# Patient Record
Sex: Male | Born: 1950 | Race: Black or African American | Hispanic: No | Smoking: Former smoker
Health system: Southern US, Community
[De-identification: ages and names within clinical notes are randomized; demographics above are authoritative.]

## PROBLEM LIST (undated history)

## (undated) DIAGNOSIS — I10 Essential (primary) hypertension: Secondary | ICD-10-CM

## (undated) DIAGNOSIS — I429 Cardiomyopathy, unspecified: Secondary | ICD-10-CM

## (undated) DIAGNOSIS — E785 Hyperlipidemia, unspecified: Secondary | ICD-10-CM

## (undated) DIAGNOSIS — K219 Gastro-esophageal reflux disease without esophagitis: Secondary | ICD-10-CM

## (undated) DIAGNOSIS — S37019A Minor contusion of unspecified kidney, initial encounter: Secondary | ICD-10-CM

## (undated) HISTORY — DX: Hyperlipidemia, unspecified: E78.5

## (undated) HISTORY — DX: Essential (primary) hypertension: I10

## (undated) HISTORY — PX: LASIK: SHX215

## (undated) HISTORY — DX: Cardiomyopathy, unspecified: I42.9

## (undated) HISTORY — DX: Minor contusion of unspecified kidney, initial encounter: S37.019A

## (undated) HISTORY — DX: Gastro-esophageal reflux disease without esophagitis: K21.9

## (undated) HISTORY — PX: OTHER SURGICAL HISTORY: SHX169

---

## 1991-03-05 HISTORY — PX: EYE SURGERY: SHX253

## 2001-02-26 ENCOUNTER — Encounter: Payer: Self-pay | Admitting: Emergency Medicine

## 2001-02-26 ENCOUNTER — Emergency Department (HOSPITAL_COMMUNITY): Admission: EM | Admit: 2001-02-26 | Discharge: 2001-02-26 | Payer: Self-pay | Admitting: Emergency Medicine

## 2002-09-18 ENCOUNTER — Inpatient Hospital Stay (HOSPITAL_COMMUNITY): Admission: EM | Admit: 2002-09-18 | Discharge: 2002-09-21 | Payer: Self-pay | Admitting: Emergency Medicine

## 2002-09-18 ENCOUNTER — Encounter: Payer: Self-pay | Admitting: Emergency Medicine

## 2002-09-20 ENCOUNTER — Encounter: Payer: Self-pay | Admitting: General Surgery

## 2004-10-26 ENCOUNTER — Encounter (INDEPENDENT_AMBULATORY_CARE_PROVIDER_SITE_OTHER): Payer: Self-pay | Admitting: Specialist

## 2004-10-26 ENCOUNTER — Ambulatory Visit (HOSPITAL_COMMUNITY): Admission: RE | Admit: 2004-10-26 | Discharge: 2004-10-26 | Payer: Self-pay | Admitting: Gastroenterology

## 2005-09-12 ENCOUNTER — Emergency Department (HOSPITAL_COMMUNITY): Admission: EM | Admit: 2005-09-12 | Discharge: 2005-09-12 | Payer: Self-pay | Admitting: Emergency Medicine

## 2006-10-22 ENCOUNTER — Encounter: Payer: Self-pay | Admitting: Cardiology

## 2007-01-09 ENCOUNTER — Encounter: Admission: RE | Admit: 2007-01-09 | Discharge: 2007-01-09 | Payer: Self-pay | Admitting: Cardiology

## 2007-04-14 ENCOUNTER — Encounter: Payer: Self-pay | Admitting: Cardiology
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
## 2010-07-20 NOTE — Op Note (Signed)
Nathan Stafford, Nathan Stafford                ACCOUNT NO.:  [id number]   MEDICAL RECORD NO.:  [id number]          PATIENT TYPE:  AMB   LOCATION:  ENDO                         FACILITY:  [facility]   PHYSICIAN:  Anselmo Rod, M.D.  DATE OF BIRTH:  [birth year]   DATE OF PROCEDURE:  10/26/2004  DATE OF DISCHARGE:                                 OPERATIVE REPORT   PROCEDURE PERFORMED:  Colonoscopy with snare polypectomy x2.   ENDOSCOPIST:  Anselmo Rod, M.D.   INSTRUMENT USED:  Olympus video colonoscope.   INDICATIONS FOR PROCEDURE:  A 60 year old African-American male with a  history of rectal bleeding underwent a screening colonoscopy to rule out  colon polyps, masses, etc.   PREPROCEDURAL PREPARATION:  Informed consent was procured for the patient.  The patient fasted for eight hours prior to the procedure and was prepped  with a bottle of magnesium citrate and a gallon of GoLYTELY the night prior  to the procedure.  Risks and benefits of the procedure, including a 10%  missed rate of cancer and polyps were discussed with the patient as well.   PREPROCEDURAL PHYSICAL EXAMINATION:  VITAL SIGNS:  Stable.  NECK:  Supple.  CHEST:  Clear to auscultation.  HEART:  S1 and S2 are regular.  ABDOMEN:  Soft with normal bowel sounds.   DESCRIPTION OF PROCEDURE:  The patient was placed in a left lateral  decubitus position.  Sedated with 100 mg of Demerol and 8 mg of Versed in  slow, incremental doses.  Once the patient was adequately sedated and  maintained on low flow oxygen, continuous cardiac monitoring, the Olympus  video colonoscope was advanced from the rectum to the cecum.  The  appendiceal orifices and ileocecal valve were visualized and photographed.  There was some residual stool in the colon.  Multiple washings were done.  The last pedunculated polyp was snared from 20 cm.  There was evidence of  sigmoid diverticulosis with stool in some of the diverticula.  A small  sessile polyp was  snared from 30 cm, and this is a small sessile polyp.  Both of the polyps were removed by hot snare.  Small internal hemorrhoids  were seen on retroflexion.  The rest of the exam, including the transverse  colon, right colon and cecum were normal.  The patient tolerated the  procedure well without complications.   IMPRESSION:  1.  Small nonbleeding internal hemorrhoids seen on retroflexion of the      rectum.  2.  Sigmoid diverticulosis.  3.  Large pedunculated polyp snared from 20 cm.  Another smaller polyp      snared from 30 cm.   RECOMMENDATIONS:  1.  Await pathology results.  2.  Avoid nonsteroidals, including aspirin, for the next two weeks.  3.  Repeat colonoscopy, depending on pathology results.  4.  Outpatient followup as the need arises in the future.      Anselmo Rod, M.D.  Electronically Signed     JNM/MEDQ  D:  10/26/2004  T:  10/26/2004  Job:  [id number]   cc:   Earlene Plater  Cloward, M.D.  [address]  [place]  [place] [address]  Fax: [phone]
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
## 2011-05-16 ENCOUNTER — Other Ambulatory Visit: Payer: Self-pay | Admitting: Family Medicine

## 2011-05-16 ENCOUNTER — Other Ambulatory Visit: Payer: Self-pay

## 2011-06-08 ENCOUNTER — Other Ambulatory Visit: Payer: Self-pay | Admitting: Family Medicine

## 2011-06-08 ENCOUNTER — Telehealth: Payer: Self-pay

## 2011-06-08 DIAGNOSIS — E785 Hyperlipidemia, unspecified: Secondary | ICD-10-CM

## 2011-06-08 DIAGNOSIS — I10 Essential (primary) hypertension: Secondary | ICD-10-CM

## 2011-06-08 MED ORDER — SIMVASTATIN 40 MG PO TABS: 40.0000 mg | ORAL_TABLET | Freq: Every day | ORAL | Status: DC

## 2011-06-08 MED ORDER — AMLODIPINE BESYLATE 5 MG PO TABS: 5.0000 mg | ORAL_TABLET | Freq: Every day | ORAL | Status: DC

## 2011-06-08 MED ORDER — LISINOPRIL-HYDROCHLOROTHIAZIDE 20-12.5 MG PO TABS: 1.0000 | ORAL_TABLET | Freq: Every day | ORAL | Status: DC

## 2011-06-08 MED ORDER — METOPROLOL SUCCINATE ER 50 MG PO TB24: 50.0000 mg | ORAL_TABLET | Freq: Every day | ORAL | Status: DC

## 2011-06-08 NOTE — Telephone Encounter (Signed)
Pt came in for cpe on Saturday - i notified him of our new policy about cpe's on weekends. Pt stated he will come in on Tuesday for cpe with dr Milus Glazier. In the mean time he states he is out of refills for his rx's. States he should be ok on his bp rx's but is out of cialis.  [facility]ses [facility] [address]: [phone]  bf

## 2011-06-11 ENCOUNTER — Ambulatory Visit (INDEPENDENT_AMBULATORY_CARE_PROVIDER_SITE_OTHER): Payer: 59 | Admitting: Family Medicine

## 2011-06-11 VITALS — BP 156/83 | HR 74 | Temp 97.8°F | Resp 16 | Ht 73.5 in | Wt 203.4 lb

## 2011-06-11 DIAGNOSIS — Z0000 Encounter for general adult medical examination without abnormal findings: Secondary | ICD-10-CM

## 2011-06-11 DIAGNOSIS — R9431 Abnormal electrocardiogram [ECG] [EKG]: Secondary | ICD-10-CM

## 2011-06-11 LAB — IFOBT (OCCULT BLOOD): IFOBT: NEGATIVE

## 2011-06-11 NOTE — Progress Notes (Signed)
This 61 year old gentleman comes in for physical. He's continuing to work, smokes half a pack a day, recently his eyes checked. He hates needles and refuses immunizations today.  Colonoscopy 4 years ago.  Review of systems: Negative  Objective: HEENT-small right forehead, marked cupping of optic disc, no acute dental problems  Neck: Supple no adenopathy  Chest: Few rhonchi  Heart: Regular no murmur  Abdomen: Soft nontender without HSM  Genitalia normal with no hernia  Rectal: Normal prostate Aref neuro: Intact  Assessment: Nathan Stafford who continues to smoke and is usual state of health with no acute problems  Plan: Manual labs,

## 2011-06-12 LAB — CBC
HCT: 42.7 % (ref 39.0–52.0)
Hemoglobin: 14 g/dL (ref 13.0–17.0)
MCH: 27.9 pg (ref 26.0–34.0)
MCHC: 32.8 g/dL (ref 30.0–36.0)
MCV: 85.2 fL (ref 78.0–100.0)
Platelets: 202 10*3/uL (ref 150–400)
RBC: 5.01 MIL/uL (ref 4.22–5.81)
RDW: 14.8 % (ref 11.5–15.5)
WBC: 9.5 10*3/uL (ref 4.0–10.5)

## 2011-06-12 LAB — LIPID PANEL
Cholesterol: 137 mg/dL (ref 0–200)
HDL: 46 mg/dL (ref >39)
LDL Cholesterol: 73 mg/dL (ref 0–99)
Total CHOL/HDL Ratio: 3 Ratio
Triglycerides: 89 mg/dL (ref <150)
VLDL: 18 mg/dL (ref 0–40)

## 2011-06-12 LAB — COMPREHENSIVE METABOLIC PANEL
ALT: 21 U/L (ref 0–53)
AST: 23 U/L (ref 0–37)
Albumin: 4.6 g/dL (ref 3.5–5.2)
Alkaline Phosphatase: 79 U/L (ref 39–117)
BUN: 15 mg/dL (ref 6–23)
CO2: 22 mEq/L (ref 19–32)
Calcium: 9.4 mg/dL (ref 8.4–10.5)
Chloride: 107 mEq/L (ref 96–112)
Creat: 0.93 mg/dL (ref 0.50–1.35)
Glucose, Bld: 92 mg/dL (ref 70–99)
Potassium: 4.3 mEq/L (ref 3.5–5.3)
Sodium: 143 mEq/L (ref 135–145)
Total Bilirubin: 0.4 mg/dL (ref 0.3–1.2)
Total Protein: 7.1 g/dL (ref 6.0–8.3)

## 2011-07-02 NOTE — Progress Notes (Signed)
LMOM to call back

## 2011-07-03 ENCOUNTER — Ambulatory Visit: Payer: Self-pay | Admitting: Cardiology

## 2011-07-11 ENCOUNTER — Ambulatory Visit (INDEPENDENT_AMBULATORY_CARE_PROVIDER_SITE_OTHER): Payer: Self-pay | Admitting: Cardiology

## 2011-07-11 ENCOUNTER — Encounter: Payer: Self-pay | Admitting: Cardiology

## 2011-07-11 VITALS — BP 159/90 | HR 64 | Ht 75.0 in | Wt 208.0 lb

## 2011-07-11 DIAGNOSIS — I10 Essential (primary) hypertension: Secondary | ICD-10-CM

## 2011-07-11 DIAGNOSIS — R9431 Abnormal electrocardiogram [ECG] [EKG]: Secondary | ICD-10-CM

## 2011-07-11 DIAGNOSIS — F17200 Nicotine dependence, unspecified, uncomplicated: Secondary | ICD-10-CM

## 2011-07-11 DIAGNOSIS — E785 Hyperlipidemia, unspecified: Secondary | ICD-10-CM

## 2011-07-11 DIAGNOSIS — Z720 Tobacco use: Secondary | ICD-10-CM

## 2011-07-11 DIAGNOSIS — I428 Other cardiomyopathies: Secondary | ICD-10-CM

## 2011-07-11 MED ORDER — LISINOPRIL-HYDROCHLOROTHIAZIDE 20-25 MG PO TABS: 1.0000 | ORAL_TABLET | Freq: Every day | ORAL | Status: DC

## 2011-07-11 NOTE — Patient Instructions (Addendum)
Please increase your Lisinopril/HCTZ to 20/25 mg  Continue all other medications in as listed  Please have blood work in 2 weeks (BMP)  Your physician has requested that you have an echocardiogram. Echocardiography is a painless test that uses sound waves to create images of your heart. It provides your doctor with information about the size and shape of your heart and how well your heart's chambers and valves are working. This procedure takes approximately one hour. There are no restrictions for this procedure.   Follow up with Tereso Newcomer, PA in one month.

## 2011-07-11 NOTE — Progress Notes (Signed)
HPI The patient presents for evaluation of an abnormal EKG. He has no prior cardiac history but does report stress testing and I see that he saw another cardiology group in 2008. I was able to obtain these records and he had a catheterization in 2008 demonstrating normal coronaries. He does have a cardiomyopathy with an ejection fraction of 45% however. The patient was unaware of this. His EKG at that time did demonstrate LVH with an interventricular conduction delay and a leftward axis. He had T-wave inversions consistent with repolarization changes. He does have an EKG demonstrating now with right bundle branch block with left anterior fascicular block. He has hypertension and the last readings that I see have been above target with systolics in the 150s and diastolics above 90. He denies any symptoms. The patient denies any new symptoms such as chest discomfort, neck or arm discomfort. There has been no new shortness of breath, PND or orthopnea. There have been no reported palpitations, presyncope or syncope.  He says he works a very physical and active job cannot bring on any symptoms with this.  No Known Allergies  Current Outpatient Prescriptions  Medication Sig Dispense Refill  . amLODipine (NORVASC) 5 MG tablet Take 1 tablet (5 mg total) by mouth daily.  30 tablet  0  . aspirin 81 MG tablet Take 81 mg by mouth daily.      . brimonidine (ALPHAGAN) 0.2 % ophthalmic solution 1 drop 3 (three) times daily.      . clindamycin (CLEOCIN) 150 MG capsule Take 150 mg by mouth 3 (three) times daily.      . dorzolamide-timolol (COSOPT) 22.3-6.8 MG/ML ophthalmic solution 1 drop 2 (two) times daily.      Marland Kitchen latanoprost (XALATAN) 0.005 % ophthalmic solution 1 drop at bedtime.      Marland Kitchen lisinopril-hydrochlorothiazide (PRINZIDE,ZESTORETIC) 20-12.5 MG per tablet Take 1 tablet by mouth daily.  30 tablet  0  . metoprolol succinate (TOPROL-XL) 50 MG 24 hr tablet Take 1 tablet (50 mg total) by mouth daily. Take with  or immediately following a meal.  30 tablet  0  . Multiple Vitamin (MULTIVITAMIN) tablet Take 1 tablet by mouth as needed. complexin      . NON FORMULARY as needed. androzene      . simvastatin (ZOCOR) 40 MG tablet Take 1 tablet (40 mg total) by mouth at bedtime.  30 tablet  0  . tadalafil (CIALIS) 20 MG tablet Take 20 mg by mouth daily as needed.        Past Medical History  Diagnosis Date  . Glaucoma   . Perinephric hematoma   . Hematuria     Past Surgical History  Procedure Date  . Colonscopy   . Lasik     Family History  Problem Relation Age of Onset  . Diabetes Father     History   Social History  . Marital Status: Married    Spouse Name: N/A    Number of Children: 1  . Years of Education: N/A   Occupational History  . Receiving Clerk    Social History Main Topics  . Smoking status: Current Everyday Smoker -- 0.5 packs/day for 30 years    Types: Cigarettes  . Smokeless tobacco: Not on file  . Alcohol Use: Yes  . Drug Use: Not on file  . Sexually Active: Not on file   Other Topics Concern  . Not on file   Social History Narrative   Lives with his wife.  ROS:  As stated in the HPI and negative for all other systems.  PHYSICAL EXAM BP 159/90  Pulse 64  Ht 6\' 3"  (1.905 m)  Wt 208 lb (94.348 kg)  BMI 26.00 kg/m2 GENERAL:  Well appearing HEENT:  Pupils equal round and reactive, fundi not visualized, oral mucosa unremarkable, poor dentition NECK:  No jugular venous distention, waveform within normal limits, carotid upstroke brisk and symmetric, no bruits, no thyromegaly LYMPHATICS:  No cervical, inguinal adenopathy LUNGS:  Clear to auscultation bilaterally BACK:  No CVA tenderness CHEST:  Unremarkable HEART:  PMI not displaced or sustained,S1 and S2 within normal limits, no S3, no S4, no clicks, no rubs, no murmurs ABD:  Flat, positive bowel sounds normal in frequency in pitch, no bruits, no rebound, no guarding, no midline pulsatile mass, no  hepatomegaly, no splenomegaly EXT:  2 plus pulses throughout, no edema, no cyanosis no clubbing SKIN:  No rashes no nodules NEURO:  Cranial nerves II through XII grossly intact, motor grossly intact throughout [facility]:  Cognitively intact, oriented to person place and time   EKG:  Sinus rhythm, RBBB, left axis deviation, left bundle branch block, rate 66 07/02/11  ASSESSMENT AND PLAN

## 2011-07-11 NOTE — Assessment & Plan Note (Signed)
I will increase the Lisinopril HCT to 20/25 with a repeat BMET in 2 weeks.

## 2011-07-11 NOTE — Assessment & Plan Note (Signed)
I discussed this with him and told him he needs to pick a stop date. He is currently tapering.

## 2011-07-11 NOTE — Assessment & Plan Note (Signed)
I suspect the EKG isn't progression of his prior abnormalities related to hypertension. Given his previous cardiomyopathy felt to be hypertensive he needs a repeat echocardiogram. He needs aggressive control of his blood pressure as above.

## 2011-07-18 ENCOUNTER — Other Ambulatory Visit: Payer: Self-pay

## 2011-07-18 ENCOUNTER — Other Ambulatory Visit (HOSPITAL_COMMUNITY): Payer: Self-pay | Admitting: Radiology

## 2011-07-18 ENCOUNTER — Ambulatory Visit (HOSPITAL_COMMUNITY): Payer: 59 | Attending: Cardiology

## 2011-07-18 DIAGNOSIS — I10 Essential (primary) hypertension: Secondary | ICD-10-CM | POA: Insufficient documentation

## 2011-07-18 DIAGNOSIS — F17200 Nicotine dependence, unspecified, uncomplicated: Secondary | ICD-10-CM | POA: Insufficient documentation

## 2011-07-18 DIAGNOSIS — I519 Heart disease, unspecified: Secondary | ICD-10-CM | POA: Insufficient documentation

## 2011-07-18 DIAGNOSIS — E785 Hyperlipidemia, unspecified: Secondary | ICD-10-CM | POA: Insufficient documentation

## 2011-07-18 DIAGNOSIS — I517 Cardiomegaly: Secondary | ICD-10-CM | POA: Insufficient documentation

## 2011-07-18 DIAGNOSIS — R9431 Abnormal electrocardiogram [ECG] [EKG]: Secondary | ICD-10-CM | POA: Insufficient documentation

## 2011-07-18 DIAGNOSIS — R0602 Shortness of breath: Secondary | ICD-10-CM

## 2011-07-25 ENCOUNTER — Other Ambulatory Visit: Payer: Self-pay

## 2011-08-01 ENCOUNTER — Other Ambulatory Visit (INDEPENDENT_AMBULATORY_CARE_PROVIDER_SITE_OTHER): Payer: 59

## 2011-08-01 DIAGNOSIS — I10 Essential (primary) hypertension: Secondary | ICD-10-CM

## 2011-08-02 LAB — BASIC METABOLIC PANEL
BUN: 16 mg/dL (ref 6–23)
CO2: 24 mEq/L (ref 19–32)
GFR: 105.82 mL/min (ref >60.00)
Glucose, Bld: 92 mg/dL (ref 70–99)
Potassium: 4.1 mEq/L (ref 3.5–5.1)

## 2011-08-05 ENCOUNTER — Telehealth: Payer: Self-pay | Admitting: Cardiology

## 2011-08-05 NOTE — Telephone Encounter (Signed)
Spoke with pt about recent echo results. 

## 2011-08-05 NOTE — Telephone Encounter (Signed)
New problem:  Patient calling for test results  

## 2011-08-08 ENCOUNTER — Ambulatory Visit (INDEPENDENT_AMBULATORY_CARE_PROVIDER_SITE_OTHER): Payer: 59 | Admitting: Physician Assistant

## 2011-08-08 ENCOUNTER — Encounter: Payer: Self-pay | Admitting: Physician Assistant

## 2011-08-08 VITALS — BP 110/76 | HR 62 | Ht 75.5 in | Wt 205.0 lb

## 2011-08-08 DIAGNOSIS — I429 Cardiomyopathy, unspecified: Secondary | ICD-10-CM

## 2011-08-08 DIAGNOSIS — E785 Hyperlipidemia, unspecified: Secondary | ICD-10-CM

## 2011-08-08 DIAGNOSIS — E782 Mixed hyperlipidemia: Secondary | ICD-10-CM

## 2011-08-08 DIAGNOSIS — R9431 Abnormal electrocardiogram [ECG] [EKG]: Secondary | ICD-10-CM

## 2011-08-08 DIAGNOSIS — I10 Essential (primary) hypertension: Secondary | ICD-10-CM

## 2011-08-08 MED ORDER — SIMVASTATIN 40 MG PO TABS: 20.0000 mg | ORAL_TABLET | Freq: Every day | ORAL | Status: DC

## 2011-08-08 NOTE — Progress Notes (Signed)
[address] [place], [place]  [address] Phone: [phone] Fax:  [phone]  Date:  08/08/2011   Name:  Nathan Stafford   DOB:  11-27-1950   MRN:  [id number]  PCP:  Elvina Sidle MD, MD  Primary Cardiologist:  Dr. Rollene Rotunda  Primary Electrophysiologist:  None    History of Present Illness: Nathan Stafford is a 61 y.o. male who returns for follow up.  He was seen by Dr. Conley Simmonds for an abnormal EKG.  He apparently had a cardiac catheterization with another group in 2008 that demonstrated normal coronary arteries.  He has had reduced ejection fraction of 45% in the past.  His EKG demonstrated right bundle branch block and left anterior fascicular block.  The patient's lisinopril was adjusted.  Echocardiogram was obtained 07/18/11: Moderate LVH, EF 40-45%, grade 1 diastolic dysfunction, mild MR, mild RAE.  It is felt that his reduced ejection fraction is likely due to hypertension.  The patient denies chest pain, shortness of breath, syncope, orthopnea, PND or significant pedal edema.  Tolerating medications ok.    Wt Readings from Last 3 Encounters:  08/08/11 205 lb (92.987 kg)  07/11/11 208 lb (94.348 kg)  06/11/11 203 lb 6.4 oz (92.262 kg)     Potassium  Date/Time Value Range Status  08/01/2011 10:37 AM 4.1  3.5-5.1 (mEq/L) Final     Creat  Date/Time Value Range Status  06/11/2011  8:25 AM 0.93  0.50-1.35 (mg/dL) Final     Creatinine, Ser  Date/Time Value Range Status  08/01/2011 10:37 AM 0.9  0.4-1.5 (mg/dL) Final     ALT  Date/Time Value Range Status  06/11/2011  8:25 AM 21  0-53 (U/L) Final     Hemoglobin  Date/Time Value Range Status  06/11/2011  8:25 AM 14.0  13.0-17.0 (g/dL) Final    Past Medical History  Diagnosis Date  . Glaucoma   . Perinephric hematoma   . Hematuria   . HTN (hypertension)   . 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  Marland Kitchen HLD (hyperlipidemia)     Current Outpatient Prescriptions  Medication Sig Dispense Refill  . amLODipine (NORVASC) 5 MG tablet Take 1 tablet (5 mg total) by mouth daily.  30 tablet  0  . aspirin 81 MG tablet Take 81 mg by mouth daily.      . brimonidine (ALPHAGAN) 0.2 % ophthalmic solution 1 drop 3 (three) times daily.      . clindamycin (CLEOCIN) 150 MG capsule Take 150 mg by mouth 3 (three) times daily.      . dorzolamide-timolol (COSOPT) 22.3-6.8 MG/ML ophthalmic solution 1 drop 2 (two) times daily.      Marland Kitchen latanoprost (XALATAN) 0.005 % ophthalmic solution 1 drop at bedtime.      Marland Kitchen lisinopril-hydrochlorothiazide (PRINZIDE,ZESTORETIC) 20-25 MG per tablet Take 1 tablet by mouth daily.  30 tablet  11  . metoprolol succinate (TOPROL-XL) 50 MG 24 hr tablet Take 1 tablet (50 mg total) by mouth daily. Take with or immediately following a meal.  30 tablet  0  . Multiple Vitamin (MULTIVITAMIN) tablet Take 1 tablet by mouth as needed. complexin      . NON FORMULARY as needed. androzene      . simvastatin (ZOCOR) 40 MG tablet Take 1 tablet (40 mg total) by mouth at bedtime.  30  tablet  0  . tadalafil (CIALIS) 20 MG tablet Take 20 mg by mouth daily as needed.        Allergies: No Known Allergies  History  Substance Use Topics  . Smoking status: Current Everyday Smoker -- 0.5 packs/day for 30 years    Types: Cigarettes  . Smokeless tobacco: Not on file  . Alcohol Use: Yes     ROS:  Please see the history of present illness.   All other systems reviewed and negative.   PHYSICAL EXAM: VS:  BP 110/76  Pulse 62  Ht 6' 3.5" (1.918 m)  Wt 205 lb (92.987 kg)  BMI 25.28 kg/m2 Well nourished, well developed, in no acute distress HEENT: normal Neck: no JVD Cardiac:  normal S1, S2; RRR; no murmur Lungs:  clear to auscultation bilaterally, no wheezing, rhonchi or rales Abd: soft, nontender, no  hepatomegaly Ext: no edema Skin: warm and dry Neuro:  CNs 2-12 intact, no focal abnormalities noted  EKG:  Sinus rhythm, heart rate 62, left axis deviation, right bundle branch block, no change from prior tracing   ASSESSMENT AND PLAN:  1.  Hypertension Controlled.  Continue current therapy.   2.  Cardiomyopathy EF 40-45% by recent echo.  Likely hypertensive cardiomyopathy.  Continue beta blocker and ACE inhibitor therapy.  Followup with me in 3 months.  3.  Hyperlipidemia Patient is on simvastatin as well as amlodipine. Maximum dose of simvastatin really is 20 mg while on amlodipine.  I have asked him to reduce his simvastatin to 20 mg a day and recheck his lipids with his PCP in about 2 months.   SignedTereso Newcomer, PA-C  11:20 AM 08/08/2011

## 2011-08-08 NOTE — Patient Instructions (Signed)
Your physician has recommended you make the following change in your medication: TAKE 1/2 TABLET (20MG ) SIMVASTATIN EACH EVENING. You have a 40 mg tablet.  Make sure you are ONLY taking half.    Your physician recommends that you schedule a follow-up appointment with your Primary Care physician in 2 months for a lipid check since your simvastatin dose has decreased.    Your physician recommends that you schedule a follow-up appointment in: 3 months with Tereso Newcomer, PA on a day that Dr. Antoine Poche is also in the office.

## 2011-10-08 ENCOUNTER — Ambulatory Visit (INDEPENDENT_AMBULATORY_CARE_PROVIDER_SITE_OTHER): Payer: 59 | Admitting: Physician Assistant

## 2011-10-08 VITALS — BP 124/62 | HR 87 | Temp 98.1°F | Resp 16 | Ht 73.5 in | Wt 197.8 lb

## 2011-10-08 DIAGNOSIS — E785 Hyperlipidemia, unspecified: Secondary | ICD-10-CM

## 2011-10-08 LAB — LIPID PANEL
Cholesterol: 154 mg/dL (ref 0–200)
HDL: 44 mg/dL (ref >39)
LDL Cholesterol: 86 mg/dL (ref 0–99)
Total CHOL/HDL Ratio: 3.5 Ratio
Triglycerides: 119 mg/dL (ref <150)
VLDL: 24 mg/dL (ref 0–40)

## 2011-10-08 LAB — COMPREHENSIVE METABOLIC PANEL
ALT: 69 U/L — ABNORMAL HIGH (ref 0–53)
AST: 72 U/L — ABNORMAL HIGH (ref 0–37)
Albumin: 4.8 g/dL (ref 3.5–5.2)
Alkaline Phosphatase: 89 U/L (ref 39–117)
BUN: 15 mg/dL (ref 6–23)
CO2: 27 mEq/L (ref 19–32)
Calcium: 10.1 mg/dL (ref 8.4–10.5)
Chloride: 103 mEq/L (ref 96–112)
Creat: 1.05 mg/dL (ref 0.50–1.35)
Glucose, Bld: 99 mg/dL (ref 70–99)
Potassium: 4.9 mEq/L (ref 3.5–5.3)
Sodium: 138 mEq/L (ref 135–145)
Total Bilirubin: 0.5 mg/dL (ref 0.3–1.2)
Total Protein: 7.9 g/dL (ref 6.0–8.3)

## 2011-10-08 NOTE — Progress Notes (Signed)
  Subjective:    Patient ID: Nathan Stafford, male    DOB: 15-Dec-1950, 61 y.o.   MRN: [id number]  HPI 61 year old male presents for recheck of cholesterol.  Saw his cardiologist 2 months ago who decreased his simvastatin from 40 mg to 20 mg daily due to addition of amlodipine. Patient tolerated medications. Blood pressure stable at current dose. He does admit that he is not careful about his diet and does not get a lot of exercise. Denies SOB, chest pain, headaches, muscle or joint aches.    Review of Systems  All other systems reviewed and are negative.       Objective:   Physical Exam  Constitutional: He is oriented to person, place, and time. He appears well-developed and well-nourished.  HENT:  Head: Normocephalic and atraumatic.  Right Ear: External ear normal.  Left Ear: External ear normal.  Eyes: Conjunctivae are normal.  Neck: Normal range of motion.  Cardiovascular: Normal rate, regular rhythm and normal heart sounds.   Pulmonary/Chest: Effort normal and breath sounds normal.  Neurological: He is alert and oriented to person, place, and time.  Psychiatric: He has a normal mood and affect. His behavior is normal. Thought content normal.          Assessment & Plan:   1. Hyperlipidemia  Comprehensive metabolic panel, Lipid panel  Recheck of lipids today. Patient has refill on simvastatin until 2014.   If stable, continue 20 mg daily.

## 2011-11-05 ENCOUNTER — Encounter: Payer: Self-pay | Admitting: Physician Assistant

## 2011-11-05 ENCOUNTER — Ambulatory Visit (INDEPENDENT_AMBULATORY_CARE_PROVIDER_SITE_OTHER): Payer: 59 | Admitting: Physician Assistant

## 2011-11-05 VITALS — BP 106/70 | HR 77 | Ht 75.5 in | Wt 196.4 lb

## 2011-11-05 DIAGNOSIS — E785 Hyperlipidemia, unspecified: Secondary | ICD-10-CM

## 2011-11-05 DIAGNOSIS — I428 Other cardiomyopathies: Secondary | ICD-10-CM

## 2011-11-05 DIAGNOSIS — I10 Essential (primary) hypertension: Secondary | ICD-10-CM

## 2011-11-05 DIAGNOSIS — I429 Cardiomyopathy, unspecified: Secondary | ICD-10-CM | POA: Insufficient documentation

## 2011-11-05 NOTE — Patient Instructions (Addendum)
Your physician recommends that you continue on your current medications as directed. Please refer to the Current Medication list given to you today.   Your physician wants you to follow-up in: 4 MONTHS WITH DR. HOCHREIN You will receive a reminder letter in the mail two months in advance. If you don't receive a letter, please call our office to schedule the follow-up appointment.

## 2011-11-05 NOTE — Progress Notes (Signed)
[address] [place], [place]  [address] Phone: [phone] Fax:  [phone]  Date:  11/05/2011   Name:  Nathan Stafford   DOB:  28-Aug-1950   MRN:  [id number]  PCP:  Elvina Sidle, MD  Primary Cardiologist:  Dr. Rollene Rotunda  Primary Electrophysiologist:  None    History of Present Illness: Nathan Stafford is a 61 y.o. male who returns for follow up.  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 I saw him about 3 months ago. We adjusted his amlodipine as he is on simvastatin. He returns for followup. He denies chest pain, significant shortness of breath, syncope, orthopnea, PND or edema.     Recent Weights: Wt Readings from Last 3 Encounters:  11/05/11 196 lb 6.4 oz (89.086 kg)  10/08/11 197 lb 12.8 oz (89.721 kg)  08/08/11 205 lb (92.987 kg)     Recent Labs: Potassium  Date/Time Value Range Status  10/08/2011  9:11 AM 4.9  3.5 - 5.3 mEq/L Final   Creat  Date/Time Value Range Status  10/08/2011  9:11 AM 1.05  0.50 - 1.35 mg/dL Final    Past Medical History  Diagnosis Date  . Glaucoma   . Perinephric hematoma   . Hematuria   . HTN (hypertension)   . Cardiomyopathy secondary     likely HTN cardiomyopathy;  Echocardiogram was obtained 07/18/11: Moderate LVH, EF 40-45%, grade 1 diastolic dysfunction, mild MR, mild RAE.;  cardiac cath in 2008 with normal coronary arteries.  Marland Kitchen HLD (hyperlipidemia)     Current Outpatient Prescriptions  Medication Sig Dispense Refill  . amLODipine (NORVASC) 5 MG tablet Take 1 tablet (5 mg total) by mouth daily.  30 tablet  0  . aspirin 81 MG tablet Take 81 mg by mouth daily.      . brimonidine (ALPHAGAN) 0.2 % ophthalmic solution 1 drop 3 (three) times daily.      . clindamycin (CLEOCIN) 150 MG capsule Take 150 mg by mouth 3 (three) times daily.      . dorzolamide-timolol (COSOPT)  22.3-6.8 MG/ML ophthalmic solution 1 drop 2 (two) times daily.      Marland Kitchen latanoprost (XALATAN) 0.005 % ophthalmic solution 1 drop at bedtime.      Marland Kitchen lisinopril-hydrochlorothiazide (PRINZIDE,ZESTORETIC) 20-25 MG per tablet Take 1 tablet by mouth daily.  30 tablet  11  . metoprolol succinate (TOPROL-XL) 50 MG 24 hr tablet Take 1 tablet (50 mg total) by mouth daily. Take with or immediately following a meal.  30 tablet  0  . Multiple Vitamin (MULTIVITAMIN) tablet Take 1 tablet by mouth as needed. complexin      . NON FORMULARY as needed. androzene      . simvastatin (ZOCOR) 40 MG tablet Take 0.5 tablets (20 mg total) by mouth at bedtime.  30 tablet  1  . tadalafil (CIALIS) 20 MG tablet Take 20 mg by mouth daily as needed.        Allergies: No Known Allergies  History  Substance Use Topics  . Smoking status: Current Everyday Smoker -- 0.5 packs/day for 30 years    Types: Cigarettes  . Smokeless tobacco: Not on file  . Alcohol Use: Yes     PHYSICAL EXAM: VS:  BP 106/70  Pulse 77  Ht 6' 3.5" (1.918 m)  Wt 196 lb 6.4 oz (89.086 kg)  BMI 24.22 kg/m2 Well nourished, well developed,  in no acute distress HEENT: normal Neck: no JVD Vascular: No carotid bruits Cardiac:  normal S1, S2; RRR; no murmur Lungs:  clear to auscultation bilaterally, no wheezing, rhonchi or rales Abd: soft, nontender, no hepatomegaly Ext: no edema Skin: warm and dry Neuro:  CNs 2-12 intact, no focal abnormalities noted  EKG:  Sinus rhythm, heart rate 77, left axis deviation, right bundle branch block, no change from prior tracing  ASSESSMENT AND PLAN:  1.  Hypertension:  Controlled.  Continue current therapy.    2.  Cardiomyopathy:  EF 40-45% by recent echo.  Likely hypertensive cardiomyopathy.  Continue ACE and beta blocker. Blood pressure too soft to adjust medications further. Followup with Dr. Antoine Poche in 4-5 months.  3.  Hyperlipidemia:  Followed by primary care.  Recent LFTs elevated.  Plan is to recheck  in 3 mos.   Lab Results  Component Value Date   CHOL 154 10/08/2011   HDL 44 10/08/2011   LDLCALC 86 10/08/2011   TRIG 119 10/08/2011   Lab Results  Component Value Date   ALT 69* 10/08/2011     Signed, Tereso Newcomer, PA-C  8:35 AM 11/05/2011

## 2012-05-02 ENCOUNTER — Ambulatory Visit (INDEPENDENT_AMBULATORY_CARE_PROVIDER_SITE_OTHER): Payer: PRIVATE HEALTH INSURANCE | Admitting: Emergency Medicine

## 2012-05-02 VITALS — BP 149/81 | HR 68 | Temp 98.0°F | Resp 16 | Ht 73.5 in | Wt 197.0 lb

## 2012-05-02 DIAGNOSIS — Z0000 Encounter for general adult medical examination without abnormal findings: Secondary | ICD-10-CM

## 2012-05-02 DIAGNOSIS — N529 Male erectile dysfunction, unspecified: Secondary | ICD-10-CM

## 2012-05-02 LAB — POCT URINALYSIS DIPSTICK
Blood, UA: NEGATIVE
Protein, UA: NEGATIVE
Spec Grav, UA: 1.02
Urobilinogen, UA: 0.2
pH, UA: 6.5

## 2012-05-02 LAB — POCT CBC
Granulocyte percent: 64.6 %G (ref 37–80)
MCH, POC: 288 pg — AB (ref 27–31.2)
MCV: 87.8 fL (ref 80–97)
MID (cbc): 0.6 (ref 0–0.9)
POC LYMPH PERCENT: 28.5 %L (ref 10–50)
Platelet Count, POC: 203 10*3/uL (ref 142–424)
RDW, POC: 15 %
WBC: 8.6 10*3/uL (ref 4.6–10.2)

## 2012-05-02 LAB — LIPID PANEL
Cholesterol: 163 mg/dL (ref 0–200)
HDL: 43 mg/dL (ref >39)
Total CHOL/HDL Ratio: 3.8 Ratio

## 2012-05-02 LAB — TSH: TSH: 1.575 u[IU]/mL (ref 0.350–4.500)

## 2012-05-02 LAB — COMPREHENSIVE METABOLIC PANEL
Alkaline Phosphatase: 78 U/L (ref 39–117)
BUN: 12 mg/dL (ref 6–23)
Glucose, Bld: 104 mg/dL — ABNORMAL HIGH (ref 70–99)
Total Bilirubin: 0.4 mg/dL (ref 0.3–1.2)

## 2012-05-02 MED ORDER — SILDENAFIL CITRATE 100 MG PO TABS: 100.0000 mg | ORAL_TABLET | Freq: Every day | ORAL | Status: DC | PRN

## 2012-05-02 NOTE — Progress Notes (Signed)
[facility] [address] [place] [address] [phone]  Date:  05/02/2012   Name:  Nathan Stafford   DOB:  1950/09/18   MRN:  [id number]  PCP:  Elvina Sidle, MD    Chief Complaint: Annual Exam   History of Present Illness:  Nathan Stafford is a 62 y.o. very pleasant male patient who presents with the following:  Has reduced his tobacco to 1 pack per 2 1/2 days.   Trying to quite.  Requests a change in medication from cialis to viagra due cost and effectiveness of medication.  Tolerating medications well with no adverse side effects.  No muscle pain or joint complaints due to statin.  No current medical concerns or complaints.   Patient Active Problem List  Diagnosis  . Abnormal EKG  . HTN (hypertension)  . Tobacco abuse  . Dyslipidemia  . 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    Past Medical History  Diagnosis Date  . Glaucoma   . Perinephric hematoma   . Hematuria   . HTN (hypertension)   . Cardiomyopathy secondary     likely HTN cardiomyopathy;  Echocardiogram was obtained 07/18/11: Moderate LVH, EF 40-45%, grade 1 diastolic dysfunction, mild MR, mild RAE.;  cardiac cath in 2008 with normal coronary arteries.  Marland Kitchen HLD (hyperlipidemia)     Past Surgical History  Procedure Laterality Date  . Colonscopy    . Lasik      History  Substance Use Topics  . Smoking status: Current Every Day Smoker -- 0.50 packs/day for 30 years    Types: Cigarettes  . Smokeless tobacco: Not on file  . Alcohol Use: Yes    Family History  Problem Relation Age of Onset  . Diabetes Father     No Known Allergies  Medication list has been reviewed and updated.  Current Outpatient Prescriptions on File Prior to Visit  Medication Sig Dispense Refill  . amLODipine (NORVASC) 5 MG tablet Take 1 tablet (5 mg total) by mouth daily.  30 tablet  0  . aspirin 81 MG tablet Take 81 mg by mouth daily.      . brimonidine (ALPHAGAN) 0.2 % ophthalmic solution 1 drop 3 (three)  times daily.      . clindamycin (CLEOCIN) 150 MG capsule Take 150 mg by mouth 3 (three) times daily.      . dorzolamide-timolol (COSOPT) 22.3-6.8 MG/ML ophthalmic solution 1 drop 2 (two) times daily.      Marland Kitchen latanoprost (XALATAN) 0.005 % ophthalmic solution 1 drop at bedtime.      Marland Kitchen lisinopril-hydrochlorothiazide (PRINZIDE,ZESTORETIC) 20-25 MG per tablet Take 1 tablet by mouth daily.  30 tablet  11  . metoprolol succinate (TOPROL-XL) 50 MG 24 hr tablet Take 1 tablet (50 mg total) by mouth daily. Take with or immediately following a meal.  30 tablet  0  . Multiple Vitamin (MULTIVITAMIN) tablet Take 1 tablet by mouth as needed. complexin      . NON FORMULARY as needed. androzene      . simvastatin (ZOCOR) 40 MG tablet Take 0.5 tablets (20 mg total) by mouth at bedtime.  30 tablet  1  . tadalafil (CIALIS) 20 MG tablet Take 20 mg by mouth daily as needed.       No current facility-administered medications on file prior to visit.    Review of Systems:  As per HPI, otherwise negative.    Physical Examination: Filed Vitals:   05/02/12 0811  BP: 149/81  Pulse: 68  Temp:  98 F (36.7 C)  Resp: 16   Filed Vitals:   05/02/12 0811  Height: 6' 1.5" (1.867 m)  Weight: 197 lb (89.359 kg)   Body mass index is 25.64 kg/(m^2). Ideal Body Weight: Weight in (lb) to have BMI = 25: 191.7  GEN: WDWN, NAD, Non-toxic, A & O x 3 HEENT: Atraumatic, Normocephalic. Neck supple. No masses, No LAD. Ears and Nose: No external deformity. CV: RRR, No M/G/R. No JVD. No thrill. No extra heart sounds. PULM: CTA B, no wheezes, crackles, rhonchi. No retractions. No resp. distress. No accessory muscle use. ABD: S, NT, ND, +BS. No rebound. No HSM. EXTR: No c/c/e NEURO Normal gait.  PSYCH: Normally interactive. Conversant. Not depressed or anxious appearing.  Calm demeanor. . Rectal: prostate normal.  No stool  Assessment and Plan: Hypertension Hyperlipidemia Glaucoma Labs pending.  Carmelina Dane,  MD

## 2012-05-02 NOTE — Progress Notes (Signed)
Reviewed and agree.

## 2012-06-19 ENCOUNTER — Other Ambulatory Visit: Payer: Self-pay | Admitting: Internal Medicine

## 2012-06-20 ENCOUNTER — Other Ambulatory Visit: Payer: Self-pay | Admitting: Family Medicine

## 2012-07-20 ENCOUNTER — Other Ambulatory Visit: Payer: Self-pay | Admitting: Internal Medicine

## 2012-07-23 ENCOUNTER — Other Ambulatory Visit: Payer: Self-pay | Admitting: Cardiology

## 2012-07-23 NOTE — Telephone Encounter (Signed)
..   Requested Prescriptions   Pending Prescriptions Disp Refills  . lisinopril-hydrochlorothiazide (PRINZIDE,ZESTORETIC) 20-25 MG per tablet [Pharmacy Med Name: LISINOP/HCTZ 20-25MG TAB] 30 tablet 2    Sig: TAKE ONE TABLET BY MOUTH EVERY DAY

## 2012-11-11 ENCOUNTER — Other Ambulatory Visit: Payer: Self-pay | Admitting: *Deleted

## 2012-11-11 MED ORDER — LISINOPRIL-HYDROCHLOROTHIAZIDE 20-25 MG PO TABS: 1.0000 | ORAL_TABLET | Freq: Every day | ORAL | Status: DC

## 2012-12-16 ENCOUNTER — Other Ambulatory Visit: Payer: Self-pay

## 2012-12-16 MED ORDER — LISINOPRIL-HYDROCHLOROTHIAZIDE 20-25 MG PO TABS: 1.0000 | ORAL_TABLET | Freq: Every day | ORAL | Status: DC

## 2013-01-20 ENCOUNTER — Other Ambulatory Visit: Payer: Self-pay | Admitting: Physician Assistant

## 2013-01-20 ENCOUNTER — Other Ambulatory Visit: Payer: Self-pay | Admitting: Cardiology

## 2013-01-22 ENCOUNTER — Telehealth: Payer: Self-pay

## 2013-01-22 NOTE — Telephone Encounter (Signed)
Patient states he is to have four medications at the pharmacy.   He does not need the viagra.   Not sure which four they are.    [phone]

## 2013-01-22 NOTE — Telephone Encounter (Signed)
I do not know which four either. Called. He will call back with the names of the meds.

## 2013-01-22 NOTE — Telephone Encounter (Signed)
Pt called back and stated he needs 4 medications filled until he can get in for his CPE the first of Jan. He just took out more ins and it will allow him to come in Jan. The 4 meds he needs are lisinopril, amlodipine, metoprolol, and simvastatin. He stated that pharm has 2 or 3 RFs ready but he is not sure which ones. It looks like the lisinopril and metoprolol but neither for long enough to last until Jan. Can we send RFs for that long? I have pended Rxs for 2 mos each for review.

## 2013-01-23 NOTE — Telephone Encounter (Signed)
Patient called again this morning. Says he has gotten all his meds except the blood pressure rx and he is completely out. I told him once it was reviewed and approved by a provider he would get a call.

## 2013-01-25 MED ORDER — METOPROLOL SUCCINATE ER 50 MG PO TB24: 50.0000 mg | ORAL_TABLET | Freq: Every day | ORAL | Status: DC

## 2013-01-25 MED ORDER — SIMVASTATIN 40 MG PO TABS: ORAL_TABLET | ORAL | Status: DC

## 2013-01-25 MED ORDER — AMLODIPINE BESYLATE 5 MG PO TABS: ORAL_TABLET | ORAL | Status: DC

## 2013-01-25 MED ORDER — LISINOPRIL-HYDROCHLOROTHIAZIDE 20-25 MG PO TABS: ORAL_TABLET | ORAL | Status: DC

## 2013-01-25 NOTE — Telephone Encounter (Signed)
Sent.  Follow up as planned in january

## 2013-03-20 ENCOUNTER — Ambulatory Visit (INDEPENDENT_AMBULATORY_CARE_PROVIDER_SITE_OTHER): Payer: BC Managed Care – PPO | Admitting: Emergency Medicine

## 2013-03-20 VITALS — BP 144/84 | HR 84 | Temp 98.1°F | Resp 16 | Ht 73.5 in | Wt 190.6 lb

## 2013-03-20 DIAGNOSIS — E785 Hyperlipidemia, unspecified: Secondary | ICD-10-CM

## 2013-03-20 DIAGNOSIS — I429 Cardiomyopathy, unspecified: Secondary | ICD-10-CM

## 2013-03-20 DIAGNOSIS — I10 Essential (primary) hypertension: Secondary | ICD-10-CM

## 2013-03-20 DIAGNOSIS — I428 Other cardiomyopathies: Secondary | ICD-10-CM

## 2013-03-20 DIAGNOSIS — Z0000 Encounter for general adult medical examination without abnormal findings: Secondary | ICD-10-CM

## 2013-03-20 LAB — COMPREHENSIVE METABOLIC PANEL
ALT: 31 U/L (ref 0–53)
AST: 24 U/L (ref 0–37)
Albumin: 4.7 g/dL (ref 3.5–5.2)
Alkaline Phosphatase: 71 U/L (ref 39–117)
BUN: 18 mg/dL (ref 6–23)
CALCIUM: 10.1 mg/dL (ref 8.4–10.5)
CO2: 29 meq/L (ref 19–32)
Chloride: 104 mEq/L (ref 96–112)
Creat: 1.04 mg/dL (ref 0.50–1.35)
Glucose, Bld: 100 mg/dL — ABNORMAL HIGH (ref 70–99)
Potassium: 5 mEq/L (ref 3.5–5.3)
Sodium: 138 mEq/L (ref 135–145)
Total Bilirubin: 0.5 mg/dL (ref 0.3–1.2)
Total Protein: 7.4 g/dL (ref 6.0–8.3)

## 2013-03-20 LAB — POCT URINALYSIS DIPSTICK
BILIRUBIN UA: NEGATIVE
GLUCOSE UA: NEGATIVE
Ketones, UA: NEGATIVE
Leukocytes, UA: NEGATIVE
Nitrite, UA: NEGATIVE
Protein, UA: NEGATIVE
SPEC GRAV UA: 1.02
Urobilinogen, UA: 0.2
pH, UA: 5.5

## 2013-03-20 LAB — LIPID PANEL
CHOL/HDL RATIO: 3.6 ratio
Cholesterol: 150 mg/dL (ref 0–200)
HDL: 42 mg/dL (ref >39)
LDL Cholesterol: 92 mg/dL (ref 0–99)
TRIGLYCERIDES: 78 mg/dL (ref <150)
VLDL: 16 mg/dL (ref 0–40)

## 2013-03-20 LAB — TSH: TSH: 0.976 u[IU]/mL (ref 0.350–4.500)

## 2013-03-20 LAB — PSA: PSA: 1.34 ng/mL (ref <=4.00)

## 2013-03-20 LAB — IFOBT (OCCULT BLOOD): IFOBT: NEGATIVE

## 2013-03-20 MED ORDER — LISINOPRIL-HYDROCHLOROTHIAZIDE 20-25 MG PO TABS: ORAL_TABLET | ORAL | Status: DC

## 2013-03-20 MED ORDER — AMLODIPINE BESYLATE 5 MG PO TABS: 5.0000 mg | ORAL_TABLET | Freq: Every day | ORAL | Status: DC

## 2013-03-20 MED ORDER — SIMVASTATIN 40 MG PO TABS: ORAL_TABLET | ORAL | Status: DC

## 2013-03-20 MED ORDER — SILDENAFIL CITRATE 100 MG PO TABS: 100.0000 mg | ORAL_TABLET | Freq: Every day | ORAL | Status: DC | PRN

## 2013-03-20 MED ORDER — METOPROLOL SUCCINATE ER 50 MG PO TB24: 50.0000 mg | ORAL_TABLET | Freq: Every day | ORAL | Status: DC

## 2013-03-20 NOTE — Progress Notes (Signed)
@[email or web address] @  Patient ID: Nathan Stafford MRN: [id number], DOB: Jun 07, 1950 63 y.o. Date of Encounter: 03/20/2013, 9:10 AM  Primary Physician: Robyn Haber, MD  Chief Complaint: Physical (CPE)  HPI: 63 y.o. y/o male with history noted below here for CPE.  Doing well. Pt reports a PMHx of Glaucoma. No other complaints or issues at this time.  Review of Systems: Consitutional: No fever, chills, fatigue, night sweats, lymphadenopathy, or weight changes. Eyes: No eye redness, or discharge. Positive for mild intermittent eye itching ENT/Mouth: Ears: No otalgia, tinnitus, hearing loss, discharge. Nose: No congestion, rhinorrhea, sinus pain, or epistaxis. Throat: No sore throat, post nasal drip, or teeth pain. Cardiovascular: No CP, palpitations, diaphoresis, DOE, edema, orthopnea, PND. Respiratory: No cough, hemoptysis, SOB, or wheezing. Gastrointestinal: No anorexia, dysphagia, reflux, pain, nausea, vomiting, hematemesis, diarrhea, constipation, BRBPR, or melena. Genitourinary: No dysuria, frequency, urgency, hematuria, incontinence, nocturia, decreased urinary stream, discharge, impotence, or testicular pain/masses. Musculoskeletal: No decreased ROM, myalgias, stiffness, joint swelling, or weakness. Skin: No rash, erythema, lesion changes, pain, warmth, jaundice, or pruritis. Neurological: No headache, dizziness, syncope, seizures, tremors, memory loss, coordination problems, or paresthesias. Psychological: No anxiety, depression, hallucinations, SI/HI. Endocrine: No fatigue, polydipsia, polyphagia, polyuria, or known diabetes. All other systems were reviewed and are otherwise negative.  Past Medical History  Diagnosis Date  . Glaucoma   . Perinephric hematoma   . Hematuria   . HTN (hypertension)   . Cardiomyopathy secondary     likely HTN cardiomyopathy;  Echocardiogram was obtained 07/18/11: Moderate LVH, EF [id number], grade 1 diastolic dysfunction, mild MR, mild RAE.;  cardiac cath in 2008  with normal coronary arteries.  Marland Kitchen HLD (hyperlipidemia)      Past Surgical History  Procedure Laterality Date  . Colonscopy    . Lasik      Home Meds:  Prior to Admission medications   Medication Sig Start Date End Date Taking? Authorizing Provider  amLODipine (NORVASC) 5 MG tablet TAKE ONE TABLET BY MOUTH EVERY DAY. PATIENT NEEDS OFFICE VISIT FOR ADDITIONAL REFILLS 01/22/13  Yes Theda Sers, PA-C  aspirin 81 MG tablet Take 81 mg by mouth daily.   Yes Historical Provider, MD  brimonidine (ALPHAGAN) 0.2 % ophthalmic solution 1 drop 3 (three) times daily.   Yes Historical Provider, MD  dorzolamide-timolol (COSOPT) 22.3-6.8 MG/ML ophthalmic solution 1 drop 2 (two) times daily.   Yes Historical Provider, MD  latanoprost (XALATAN) 0.005 % ophthalmic solution 1 drop at bedtime.   Yes Historical Provider, MD  lisinopril-hydrochlorothiazide (PRINZIDE,ZESTORETIC) 20-25 MG per tablet TAKE ONE TABLET BY MOUTH ONCE DAILY PATIENT  NEEDS  TO  MAKE  A  OFFICE  VISIT FOR FUTURE REFILLS 01/22/13  Yes Eleanore E Egan, PA-C  metoprolol succinate (TOPROL-XL) 50 MG 24 hr tablet Take 1 tablet (50 mg total) by mouth daily. PATIENT NEEDS OFFICE VISIT FOR ADDITIONAL REFILLS 01/22/13  Yes Theda Sers, PA-C  Multiple Vitamin (MULTIVITAMIN) tablet Take 1 tablet by mouth as needed. complexin   Yes Historical Provider, MD  sildenafil (VIAGRA) 100 MG tablet Take 1 tablet (100 mg total) by mouth daily as needed for erectile dysfunction. 05/02/12  Yes Ellison Carwin, MD  simvastatin (ZOCOR) 40 MG tablet TAKE ONE TABLET BY MOUTH AT BEDTIME. PATIENT NEEDS OFFICE VISIT FOR ADDITIONAL REFILLS 01/22/13  Yes Eleanore E Egan, PA-C  clindamycin (CLEOCIN) 150 MG capsule Take 150 mg by mouth 3 (three) times daily.    Historical Provider, MD  NON FORMULARY as needed. androzene    Historical Provider, MD  tadalafil (  CIALIS) 20 MG tablet Take 20 mg by mouth daily as needed.    Historical Provider, MD    Allergies: No Known  Allergies  History   Social History  . Marital Status: Married    Spouse Name: N/A    Number of Children: 1  . Years of Education: N/A   Occupational History  . Receiving Clerk    Social History Main Topics  . Smoking status: Current Every Day Smoker -- 0.50 packs/day for 30 years    Types: Cigarettes  . Smokeless tobacco: Not on file  . Alcohol Use: Yes  . Drug Use: No  . Sexual Activity: Not on file   Other Topics Concern  . Not on file   Social History Narrative   Lives with his wife.    Family History  Problem Relation Age of Onset  . Diabetes Father     Physical Exam:  Blood pressure 144/84, pulse 84, temperature 98.1 F (36.7 C), temperature source Oral, resp. rate 16, height 6' 1.5" (1.867 m), weight 190 lb 9.6 oz (86.456 kg), SpO2 99.00%.  General: Well developed, well nourished, in no acute distress. There is a lipomatous area present over his right frontal area approximately 2 x 3 cm HEENT: Normocephalic, atraumatic. Conjunctiva pink, sclera non-icteric. Pupils 2 mm constricting to 1 mm, round, regular, and equally reactive to light and accomodation. EOMI. Internal auditory canal clear. TMs with good cone of light and without pathology. Nasal mucosa pink. Nares are without discharge. No sinus tenderness. Oral mucosa pink. Dentition. Pharynx without exudate.   Neck: Supple. Trachea midline. No thyromegaly. Full ROM. No lymphadenopathy. Lungs: Clear to auscultation bilaterally without wheezes, rales, or rhonchi. Breathing is of normal effort and unlabored. Cardiovascular: RRR with S1 S2. No murmurs, rubs, or gallops appreciated. Distal pulses 2+ symmetrically. No carotid or abdominal bruits.  Abdomen: Soft, non-tender, non-distended with normoactive bowel sounds. No hepatosplenomegaly or masses. No rebound/guarding. No CVA tenderness. Without hernias.  Rectal: No external hemorrhoids or fissures. Rectal vault without masses.   Genitourinary:   circumcised male.  No penile lesions. Testes descended bilaterally, and smooth without tenderness or masses.  Musculoskeletal: Full range of motion and 5/5 strength throughout. Without swelling, atrophy, tenderness, crepitus, or warmth. Extremities without clubbing, cyanosis, or edema. Calves supple. Skin: Warm and moist without erythema, ecchymosis, wounds, or rash. Neuro: A+Ox3. CN II-XII grossly intact. Moves all extremities spontaneously. Full sensation throughout. Normal gait. DTR 2+ throughout upper and lower extremities. Finger to nose intact. Psych:  Responds to questions appropriately with a normal affect.   Results for orders placed in visit on 03/20/13  CBC WITH DIFFERENTIAL      Result Value Range   WBC    4.0 - 10.5 K/uL   RBC    4.22 - 5.81 MIL/uL   Hemoglobin    13.0 - 17.0 g/dL   HCT    39.0 - 52.0 %   MCV    78.0 - 100.0 fL   MCH    26.0 - 34.0 pg   MCHC    30.0 - 36.0 g/dL   RDW    11.5 - 15.5 %   Platelets    150 - 400 K/uL   Neutrophils Relative %    43 - 77 %   Neutro Abs    1.7 - 7.7 K/uL   Lymphocytes Relative    12 - 46 %   Lymphs Abs    0.7 - 4.0 K/uL   Monocytes Relative  3 - 12 %   Monocytes Absolute    0.1 - 1.0 K/uL   Eosinophils Relative    0 - 5 %   Eosinophils Absolute    0.0 - 0.7 K/uL   Basophils Relative    0 - 1 %   Basophils Absolute    0.0 - 0.1 K/uL   Smear Review      LIPID PANEL      Result Value Range   Cholesterol 150  0 - 200 mg/dL   Triglycerides 78  <150 mg/dL   HDL 42  >39 mg/dL   Total CHOL/HDL Ratio 3.6     VLDL 16  0 - 40 mg/dL   LDL Cholesterol 92  0 - 99 mg/dL  PSA      Result Value Range   PSA 1.34  <=4.00 ng/mL  TSH      Result Value Range   TSH    0.350 - 4.500 uIU/mL  COMPREHENSIVE METABOLIC PANEL      Result Value Range   Sodium 138  135 - 145 mEq/L   Potassium 5.0  3.5 - 5.3 mEq/L   Chloride 104  96 - 112 mEq/L   CO2 29  19 - 32 mEq/L   Glucose, Bld 100 (*) 70 - 99 mg/dL   BUN 18  6 - 23 mg/dL   Creat 1.04  0.50 - 1.35 mg/dL    Total Bilirubin 0.5  0.3 - 1.2 mg/dL   Alkaline Phosphatase 71  39 - 117 U/L   AST 24  0 - 37 U/L   ALT 31  0 - 53 U/L   Total Protein 7.4  6.0 - 8.3 g/dL   Albumin 4.7  3.5 - 5.2 g/dL   Calcium 10.1  8.4 - 10.5 mg/dL  POCT URINALYSIS DIPSTICK      Result Value Range   Color, UA yellow     Clarity, UA clear     Glucose, UA neg     Bilirubin, UA neg     Ketones, UA neg     Spec Grav, UA 1.020     Blood, UA trace-intact     pH, UA 5.5     Protein, UA neg     Urobilinogen, UA 0.2     Nitrite, UA neg     Leukocytes, UA Negative    IFOBT (OCCULT BLOOD)      Result Value Range   IFOBT Negative     Studies: CBC, CMET, Lipid, PSA, TSH,     Assessment/Plan:  63 y.o. y/o here for physical exam. He has a history of hypertension and high cholesterol.. he also has a history of ED according to the record. All meds were refilled for one year  -  Signed, Nena Jordan, MD 03/20/2013 9:10 AM

## 2013-03-21 LAB — CBC WITH DIFFERENTIAL/PLATELET
Basophils Absolute: 0 10*3/uL (ref 0.0–0.1)
Basophils Relative: 0 % (ref 0–1)
EOS ABS: 0.1 10*3/uL (ref 0.0–0.7)
EOS PCT: 1 % (ref 0–5)
HEMATOCRIT: 44.4 % (ref 39.0–52.0)
HEMOGLOBIN: 14.9 g/dL (ref 13.0–17.0)
LYMPHS ABS: 2.5 10*3/uL (ref 0.7–4.0)
LYMPHS PCT: 31 % (ref 12–46)
MCH: 29.3 pg (ref 26.0–34.0)
MCHC: 33.6 g/dL (ref 30.0–36.0)
MCV: 87.4 fL (ref 78.0–100.0)
MONO ABS: 0.9 10*3/uL (ref 0.1–1.0)
MONOS PCT: 11 % (ref 3–12)
Neutro Abs: 4.6 10*3/uL (ref 1.7–7.7)
Neutrophils Relative %: 57 % (ref 43–77)
PLATELETS: 204 10*3/uL (ref 150–400)
RBC: 5.08 MIL/uL (ref 4.22–5.81)
RDW: 14.9 % (ref 11.5–15.5)
WBC: 8.1 10*3/uL (ref 4.0–10.5)

## 2013-03-26 ENCOUNTER — Encounter: Payer: Self-pay | Admitting: Emergency Medicine

## 2013-04-09 ENCOUNTER — Ambulatory Visit: Payer: 59 | Admitting: Cardiology

## 2014-04-14 ENCOUNTER — Other Ambulatory Visit: Payer: Self-pay | Admitting: Emergency Medicine

## 2014-04-15 ENCOUNTER — Ambulatory Visit (INDEPENDENT_AMBULATORY_CARE_PROVIDER_SITE_OTHER): Payer: 59 | Admitting: Family Medicine

## 2014-04-15 VITALS — BP 110/70 | HR 82 | Temp 97.6°F | Resp 16 | Ht 74.0 in | Wt 195.0 lb

## 2014-04-15 DIAGNOSIS — Z125 Encounter for screening for malignant neoplasm of prostate: Secondary | ICD-10-CM

## 2014-04-15 DIAGNOSIS — Z131 Encounter for screening for diabetes mellitus: Secondary | ICD-10-CM

## 2014-04-15 DIAGNOSIS — Z1211 Encounter for screening for malignant neoplasm of colon: Secondary | ICD-10-CM

## 2014-04-15 DIAGNOSIS — Z130 Encounter for screening for diseases of the blood and blood-forming organs and certain disorders involving the immune mechanism: Secondary | ICD-10-CM

## 2014-04-15 DIAGNOSIS — K219 Gastro-esophageal reflux disease without esophagitis: Secondary | ICD-10-CM

## 2014-04-15 DIAGNOSIS — I10 Essential (primary) hypertension: Secondary | ICD-10-CM | POA: Diagnosis not present

## 2014-04-15 DIAGNOSIS — Z0000 Encounter for general adult medical examination without abnormal findings: Secondary | ICD-10-CM | POA: Diagnosis not present

## 2014-04-15 DIAGNOSIS — E785 Hyperlipidemia, unspecified: Secondary | ICD-10-CM

## 2014-04-15 LAB — POCT URINALYSIS DIPSTICK
Bilirubin, UA: NEGATIVE
Blood, UA: NEGATIVE
GLUCOSE UA: NEGATIVE
KETONES UA: NEGATIVE
LEUKOCYTES UA: NEGATIVE
Nitrite, UA: NEGATIVE
PH UA: 6.5
PROTEIN UA: NEGATIVE
Spec Grav, UA: 1.02
UROBILINOGEN UA: 2

## 2014-04-15 LAB — LIPID PANEL
Cholesterol: 184 mg/dL (ref 0–200)
HDL: 53 mg/dL (ref >39)
LDL CALC: 112 mg/dL — AB (ref 0–99)
TRIGLYCERIDES: 96 mg/dL (ref <150)
Total CHOL/HDL Ratio: 3.5 Ratio
VLDL: 19 mg/dL (ref 0–40)

## 2014-04-15 LAB — COMPREHENSIVE METABOLIC PANEL
ALBUMIN: 4.6 g/dL (ref 3.5–5.2)
ALT: 66 U/L — AB (ref 0–53)
AST: 57 U/L — ABNORMAL HIGH (ref 0–37)
Alkaline Phosphatase: 69 U/L (ref 39–117)
BILIRUBIN TOTAL: 0.7 mg/dL (ref 0.2–1.2)
BUN: 14 mg/dL (ref 6–23)
CALCIUM: 10.2 mg/dL (ref 8.4–10.5)
CO2: 30 mEq/L (ref 19–32)
Chloride: 105 mEq/L (ref 96–112)
Creat: 0.95 mg/dL (ref 0.50–1.35)
GLUCOSE: 107 mg/dL — AB (ref 70–99)
Potassium: 4.3 mEq/L (ref 3.5–5.3)
Sodium: 143 mEq/L (ref 135–145)
Total Protein: 7.9 g/dL (ref 6.0–8.3)

## 2014-04-15 LAB — POCT CBC
Granulocyte percent: 53.2 %G (ref 37–80)
HCT, POC: 48.1 % (ref 43.5–53.7)
HEMOGLOBIN: 15.4 g/dL (ref 14.1–18.1)
Lymph, poc: 2.6 (ref 0.6–3.4)
MCH, POC: 29.2 pg (ref 27–31.2)
MCHC: 32 g/dL (ref 31.8–35.4)
MCV: 91.3 fL (ref 80–97)
MID (cbc): 0.5 (ref 0–0.9)
MPV: 8.5 fL (ref 0–99.8)
POC GRANULOCYTE: 3.5 (ref 2–6.9)
POC LYMPH PERCENT: 39.5 %L (ref 10–50)
POC MID %: 7.3 %M (ref 0–12)
Platelet Count, POC: 154 10*3/uL (ref 142–424)
RBC: 5.27 M/uL (ref 4.69–6.13)
RDW, POC: 16.6 %
WBC: 6.6 10*3/uL (ref 4.6–10.2)

## 2014-04-15 LAB — IFOBT (OCCULT BLOOD): IMMUNOLOGICAL FECAL OCCULT BLOOD TEST: NEGATIVE

## 2014-04-15 MED ORDER — RANITIDINE HCL 150 MG PO TABS: 150.0000 mg | ORAL_TABLET | Freq: Two times a day (BID) | ORAL | Status: DC

## 2014-04-15 NOTE — Patient Instructions (Signed)
Gastroesophageal Reflux Disease, Adult Gastroesophageal reflux disease (GERD) happens when acid from your stomach flows up into the esophagus. When acid comes in contact with the esophagus, the acid causes soreness (inflammation) in the esophagus. Over time, GERD may create small holes (ulcers) in the lining of the esophagus. CAUSES  1. Increased body weight. This puts pressure on the stomach, making acid rise from the stomach into the esophagus. 2. Smoking. This increases acid production in the stomach. 3. Drinking alcohol. This causes decreased pressure in the lower esophageal sphincter (valve or ring of muscle between the esophagus and stomach), allowing acid from the stomach into the esophagus. 4. Late evening meals and a full stomach. This increases pressure and acid production in the stomach. 5. A malformed lower esophageal sphincter. Sometimes, no cause is found. SYMPTOMS  1. Burning pain in the lower part of the mid-chest behind the breastbone and in the mid-stomach area. This may occur twice a week or more often. 2. Trouble swallowing. 3. Sore throat. 4. Dry cough. 5. Asthma-like symptoms including chest tightness, shortness of breath, or wheezing. DIAGNOSIS  Your caregiver may be able to diagnose GERD based on your symptoms. In some cases, X-rays and other tests may be done to check for complications or to check the condition of your stomach and esophagus. TREATMENT  Your caregiver may recommend over-the-counter or prescription medicines to help decrease acid production. Ask your caregiver before starting or adding any new medicines.  HOME CARE INSTRUCTIONS  1. Change the factors that you can control. Ask your caregiver for guidance concerning weight loss, quitting smoking, and alcohol consumption. 2. Avoid foods and drinks that make your symptoms worse, such as: 1. Caffeine or alcoholic drinks. 2. Chocolate. 3. Peppermint or mint flavorings. 4. Garlic and onions. 5. Spicy  foods. 6. Citrus fruits, such as oranges, lemons, or limes. 7. Tomato-based foods such as sauce, chili, salsa, and pizza. 8. Fried and fatty foods. 3. Avoid lying down for the 3 hours prior to your bedtime or prior to taking a nap. 4. Eat small, frequent meals instead of large meals. 5. Wear loose-fitting clothing. Do not wear anything tight around your waist that causes pressure on your stomach. 6. Raise the head of your bed 6 to 8 inches with wood blocks to help you sleep. Extra pillows will not help. 7. Only take over-the-counter or prescription medicines for pain, discomfort, or fever as directed by your caregiver. 8. Do not take aspirin, ibuprofen, or other nonsteroidal anti-inflammatory drugs (NSAIDs). SEEK IMMEDIATE MEDICAL CARE IF:   You have pain in your arms, neck, jaw, teeth, or back.  Your pain increases or changes in intensity or duration.  You develop nausea, vomiting, or sweating (diaphoresis).  You develop shortness of breath, or you faint.  Your vomit is green, yellow, black, or looks like coffee grounds or blood.  Your stool is red, bloody, or black. These symptoms could be signs of other problems, such as heart disease, gastric bleeding, or esophageal bleeding. MAKE SURE YOU:   Understand these instructions.  Will watch your condition.  Will get help right away if you are not doing well or get worse. Document Released: 11/28/2004 Document Revised: 05/13/2011 Document Reviewed: 09/07/2010 [facility] Patient Information 2015 [place], [place]. This information is not intended to replace advice given to you by your health care provider. Make sure you discuss any questions you have with your health care provider.  Keeping you healthy  Get these tests  Blood pressure- Have your blood pressure checked  once a year by your healthcare provider.  Normal blood pressure is 120/80  Weight- Have your body mass index (BMI) calculated to screen for obesity.  BMI is a measure of  body fat based on height and weight. You can also calculate your own BMI at [email or web address].  Cholesterol- Have your cholesterol checked every year.  Diabetes- Have your blood sugar checked regularly if you have high blood pressure, high cholesterol, have a family history of diabetes or if you are overweight.  Screening for Colon Cancer- Colonoscopy starting at age 87.  Screening may begin sooner depending on your family history and other health conditions. Follow up colonoscopy as directed by your Gastroenterologist.  Screening for Prostate Cancer- Both blood work (PSA) and a rectal exam help screen for Prostate Cancer.  Screening begins at age 37 with African-American men and at age 56 with Caucasian men.  Screening may begin sooner depending on your family history.  Take these medicines  Aspirin- One aspirin daily can help prevent Heart disease and Stroke.  Flu shot- Every fall.  Tetanus- Every 10 years.  Zostavax- Once after the age of 62 to prevent Shingles.  Pneumonia shot- Once after the age of 34; if you are younger than 27, ask your healthcare provider if you need a Pneumonia shot.  Take these steps  Don't smoke- If you do smoke, talk to your doctor about quitting.  For tips on how to quit, go to www.smokefree.gov or call 1-800-QUIT-NOW.  Be physically active- Exercise 5 days a week for at least 30 minutes.  If you are not already physically active start slow and gradually work up to 30 minutes of moderate physical activity.  Examples of moderate activity include walking briskly, mowing the yard, dancing, swimming, bicycling, etc.  Eat a healthy diet- Eat a variety of healthy food such as fruits, vegetables, low fat milk, low fat cheese, yogurt, lean meant, poultry, fish, beans, tofu, etc. For more information go to www.thenutritionsource.org  Drink alcohol in moderation- Limit alcohol intake to less than two drinks a day. Never drink and drive.  Dentist- Brush and  floss twice daily; visit your dentist twice a year.  Depression- Your emotional health is as important as your physical health. If you're feeling down, or losing interest in things you would normally enjoy please talk to your healthcare provider.  Eye exam- Visit your eye doctor every year.  Safe sex- If you may be exposed to a sexually transmitted infection, use a condom.  Seat belts- Seat belts can save your life; always wear one.  Smoke/Carbon Monoxide detectors- These detectors need to be installed on the appropriate level of your home.  Replace batteries at least once a year.  Skin cancer- When out in the sun, cover up and use sunscreen 15 SPF or higher.  Violence- If anyone is threatening you, please tell your healthcare provider.  Living Will/ Health care power of attorney- Speak with your healthcare provider and family.

## 2014-04-15 NOTE — Progress Notes (Signed)
   Subjective:    Patient ID: Nathan Stafford, male    DOB: Nov 03, 1950, 64 y.o.   MRN: [id number]  HPI Patient presents for complete physical with complaint of bloating and belching after meals. Not following any particular diet, but mostly eats pork, chicken, and beef and some veggies. Says he doesn't drink enough water and mostly drinks mountain dew when thirsty. Feels pressure in stomach that some times moves around torso after eating spicy foods, pizza, and drinking coffee. Uses a lot of hot sauce on his foods. Has flatulence as well, but does not have  nausea, vomiting, diarrhea, or constipation. Has daily non-painful bowel movement and has not seen blood or mucus in stools.   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   Does not check BP at home. Denies SOB, CP, edema, or HA. Is compliant with medication and is well tolerated. Is in need of refills.   Health Maintenance: Declines flu, shingles, and TDAP vaccines. Has not had cardiology or ophthalmology follow up in a few years as he lost his insurance for a time. Does not want referrals made at this time either. Colonoscopy 2013 and polyps were removed. Has been cutting back from smoking a pack daily to 1 pack/week.    Review of Systems  Constitutional: Negative for fever, chills, activity change, appetite change and fatigue.  Eyes: Negative for photophobia and visual disturbance.  Respiratory: Negative for cough, chest tightness and shortness of breath.   Cardiovascular: Negative for chest pain, palpitations and leg swelling.  Gastrointestinal: Negative for nausea, vomiting, abdominal pain, diarrhea, constipation, blood in stool and abdominal distention.       Bloating, belching and flatulence  Genitourinary: Negative for dysuria, urgency, frequency, hematuria, flank pain, discharge, penile swelling, scrotal swelling, difficulty urinating, penile pain  and testicular pain.  Musculoskeletal: Negative for myalgias and back pain.  Allergic/Immunologic: Negative for environmental allergies and food allergies.  Neurological: Negative for dizziness and headaches.  Psychiatric/Behavioral: Negative.        Objective:   Physical Exam Completed by Dr. Huey Bienenstock.      Assessment & Plan:  1. Physical exam, annual Age anticipatory guidelines given.  2. Screening for diabetes mellitus - POCT urinalysis dipstick - Comprehensive metabolic panel  3. Screening for prostate cancer - PSA  4. Dyslipidemia Continue Simvastatin. - Lipid panel  5. Screening for deficiency anemia - POCT CBC  6. Gastroesophageal reflux disease, esophagitis presence not specified - ranitidine (ZANTAC) 150 MG tablet; Take 1 tablet (150 mg total) by mouth 2 (two) times daily.  Dispense: 60 tablet; Refill: 0  7. Screen for colon cancer - IFOBT POC (occult bld, rslt in office)  8. Hypertension Stable. Continue Amlodipine, Prinzide, and metoprolol.   Alveta Heimlich PA-C  [facility] 04/15/2014 9:48 AM

## 2014-04-15 NOTE — Progress Notes (Signed)
Discussed history Tishira Brewington, PA-C.   Patient had clined having a male do his physical exam. therfore it was decided that I would do the physical exam portion, and she would continue discussing with him since she is arty gone through all the history.    physical examination:  TMs are normal. Eyes have slightly larger dilation of the right pupil than the left, both of which are small. He apparently uses glaucoma eyedrops. His TMs are normal. Throat was clear. Teeth stained. Neck supple without nodes. No thyromegaly. Chest is clear to auscultation. Heat regular without murmurs gallops or arrhythmias. No axillary or inguinal nodes. Abdomen soft without organomegaly, masses, or  Tenderness.normal externl genitalia, uncircumcised. Fore skin retracts readily. Testes normal. No hernias. Spine appears normal. Digital rectal exam reveals prostate gland to be small normal in size with no nodules Sphincr ton go Extremities unremarkable. Skin warm and dry.   plan;  PA we will do the remainder of the planning

## 2014-04-16 LAB — PSA: PSA: 1.42 ng/mL (ref <=4.00)

## 2014-04-17 ENCOUNTER — Telehealth: Payer: Self-pay | Admitting: Physician Assistant

## 2014-04-17 DIAGNOSIS — R748 Abnormal levels of other serum enzymes: Secondary | ICD-10-CM

## 2014-04-17 NOTE — Telephone Encounter (Signed)
Spoke with patient in regards to labs. PSA has increased from last year, but still within range and with normal prostate exam is not concerning. LDL up, but HDL protective. Continue Simvastatin. Liver enzymes elevated. Should increase water intake and decrease alcohol consumption. Needs to RTC in 1 month to recheck CMP.

## 2014-09-08 ENCOUNTER — Ambulatory Visit (INDEPENDENT_AMBULATORY_CARE_PROVIDER_SITE_OTHER): Payer: Medicare HMO | Admitting: Physician Assistant

## 2014-09-08 VITALS — BP 132/78 | HR 72 | Temp 98.6°F | Resp 12 | Ht 73.75 in | Wt 189.0 lb

## 2014-09-08 DIAGNOSIS — T783XXA Angioneurotic edema, initial encounter: Secondary | ICD-10-CM

## 2014-09-08 DIAGNOSIS — I10 Essential (primary) hypertension: Secondary | ICD-10-CM

## 2014-09-08 MED ORDER — METHYLPREDNISOLONE SODIUM SUCC 125 MG IJ SOLR
125.0000 mg | Freq: Once | INTRAMUSCULAR | Status: AC
  Administered 2014-09-08: 125 mg via INTRAMUSCULAR

## 2014-09-08 MED ORDER — PREDNISONE 20 MG PO TABS: ORAL_TABLET | ORAL | Status: DC

## 2014-09-08 MED ORDER — EPINEPHRINE 0.3 MG/0.3ML IJ SOAJ: 0.3000 mg | Freq: Once | INTRAMUSCULAR | Status: DC

## 2014-09-08 MED ORDER — AMLODIPINE BESYLATE 10 MG PO TABS: 10.0000 mg | ORAL_TABLET | Freq: Every day | ORAL | Status: DC

## 2014-09-08 MED ORDER — HYDROCHLOROTHIAZIDE 25 MG PO TABS: 25.0000 mg | ORAL_TABLET | Freq: Every day | ORAL | Status: DC

## 2014-09-08 NOTE — Progress Notes (Signed)
Pt has mild angioedema of the lip and nasolabial fold- no tongue or palate swelling, he denies any SOB.  Counseled that this condition can get worse suddenly and he is to seek help if any worsening.  Will DC ace as this may be the culprit. epipen rx

## 2014-09-08 NOTE — Patient Instructions (Signed)
Angioedema °Angioedema is sudden puffiness (swelling), often of the skin. It can happen: °· On your face or privates (genitals). °· In your belly (abdomen) or other body parts. °It usually happens quickly and gets better in 1 or 2 days. It often starts at night and is found when you wake up. You may get red, itchy patches of skin (hives). Attacks can be dangerous if your breathing passages get puffy. °The condition may happen only once, or it can come back at random times. It may happen for several years before it goes away for good. °HOME CARE °· Only take medicines as told by your doctor. °· Always carry your emergency allergy medicines with you. °· Wear a medical bracelet as told by your doctor. °· Avoid things that you know will cause attacks (triggers). °GET HELP IF: °· You have another attack. °· Your attacks happen more often or get worse. °· The condition was passed to you by your parents and you want to have children. °GET HELP RIGHT AWAY IF:  °· Your mouth, tongue, or lips are very puffy. °· You have trouble breathing. °· You have trouble swallowing. °· You pass out (faint). °MAKE SURE YOU:  °· Understand these instructions. °· Will watch your condition. °· Will get help right away if you are not doing well or get worse. °Document Released: 02/06/2009 Document Revised: 12/09/2012 Document Reviewed: 10/12/2012 °ExitCare® Patient Information ©2015 [employer]. This information is not intended to replace advice given to you by your health care provider. Make sure you discuss any questions you have with your health care provider. ° °

## 2014-09-08 NOTE — Progress Notes (Signed)
Subjective:    Patient ID: Nathan Stafford, male    DOB: October 04, 1950, 64 y.o.   MRN: [id number]  HPI Patient presents for infection of mouth that has been occurring monthly for about 1 year. Mouth swells for 24 hours then goes away on its own. Does not take any medication to assist with swelling. Denies pain, redness, or drainage from mouth. Denies fever, SOB, wheezing, or difficulty swallowing. Swelling switches sides. Denies changes in medication, but taking several medications for HTN, GERD, and dyslipidemia. Has not taken HTN meds yet today. Denies eating any new foods or having any food allergies. Has not changed lotions, soaps, or detergents. Knows that he needs to get some teeth pulled, but lost his dental insurance so can not afford dental appt. NKDA.   Review of Systems  Constitutional: Negative for fever and chills.  HENT: Negative for drooling and mouth sores.   Respiratory: Negative for choking, chest tightness, shortness of breath, wheezing and stridor.   Cardiovascular: Negative for chest pain.  Skin: Negative for color change.  Neurological: Negative for dizziness and light-headedness.       Objective:   Physical Exam  Constitutional: He is oriented to person, place, and time. He appears well-developed and well-nourished. No distress.  Blood pressure 132/78, pulse 72, temperature 98.6 F (37 C), temperature source Oral, resp. rate 12, height 6' 1.75" (1.873 m), weight 189 lb (85.73 kg).  HENT:  Head: Normocephalic and atraumatic.  Right Ear: External ear normal.  Left Ear: External ear normal.  Mouth/Throat: Uvula is midline and mucous membranes are normal. No oral lesions. Abnormal dentition. Dental caries present. No dental abscesses or uvula swelling. No posterior oropharyngeal edema.  Mild angioedema of the left side of face involving both lips, jaw, and cheek.   Eyes: Conjunctivae are normal. Right eye exhibits no discharge. Left eye exhibits no discharge. No scleral  icterus.  Neck: Normal range of motion. Neck supple.  Cardiovascular: Normal rate, regular rhythm and normal heart sounds.  Exam reveals no gallop and no friction rub.   No murmur heard. Pulmonary/Chest: Effort normal and breath sounds normal. No respiratory distress. He has no wheezes. He has no rales.  Lymphadenopathy:    He has no cervical adenopathy.  Neurological: He is alert and oriented to person, place, and time.  Skin: Skin is warm and dry. No rash noted. He is not diaphoretic. No erythema. No pallor.  Psychiatric: He has a normal mood and affect. His behavior is normal. Judgment and thought content normal.      Assessment & Plan:  1. Angioedema, initial encounter D/C Prinzide. Anticipatory guidance discussed and given. - predniSONE (DELTASONE) 20 MG tablet; Take 2 PO QAM x3days, 1 PO QAM x3days  Dispense: 9 tablet; Refill: 0 - methylPREDNISolone sodium succinate (SOLU-MEDROL) 125 mg/2 mL injection 125 mg; Inject 2 mLs (125 mg total) into the muscle once. - EPINEPHrine 0.3 mg/0.3 mL IJ SOAJ injection; Inject 0.3 mLs (0.3 mg total) into the muscle once.  Dispense: 1 Device; Refill: 0  2. Essential hypertension D/C Prinzide. Single pill HCTZ to be taken and increased amlodipine from 5 mg to 10 mg. RTC in 4-6 weeks to check BP. - hydrochlorothiazide (HYDRODIURIL) 25 MG tablet; Take 1 tablet (25 mg total) by mouth daily.  Dispense: 90 tablet; Refill: 3 - amLODipine (NORVASC) 10 MG tablet; Take 1 tablet (10 mg total) by mouth daily.  Dispense: 30 tablet; Refill: 3   Jeffifer Rabold PA-C  [facility] 09/08/2014 9:18 AM

## 2014-09-18 ENCOUNTER — Other Ambulatory Visit: Payer: Self-pay | Admitting: Physician Assistant

## 2014-10-24 ENCOUNTER — Ambulatory Visit (INDEPENDENT_AMBULATORY_CARE_PROVIDER_SITE_OTHER): Payer: Medicare HMO | Admitting: Physician Assistant

## 2014-10-24 ENCOUNTER — Encounter: Payer: Self-pay | Admitting: Physician Assistant

## 2014-10-24 VITALS — BP 140/86 | HR 78 | Temp 98.3°F | Resp 16 | Ht 73.8 in | Wt 197.4 lb

## 2014-10-24 DIAGNOSIS — I10 Essential (primary) hypertension: Secondary | ICD-10-CM

## 2014-10-24 NOTE — Progress Notes (Signed)
   Subjective:    Patient ID: Nathan Stafford, male    DOB: 04-21-1950, 64 y.o.   MRN: [id number]  HPI Patient presents for BP follow up. Was seen for angioedema 09/08/14 and Prinzide was d/c while HCTZ 25 mg and amlodipine 10 mg were added. Has not had any other episodes of angioedema since stoping prinzide. States that he has not had any side effects since starting both medications. Denies SOB, CP, edema, palpation, HA, or dizziness. Endorses stopping all liquor and only has a beer sometimes. Has further cut back on tobacco use and has a pack per week now. Has not made any changes to diet or started exercising. Has not seen cardiologist is several years, but is not ready to see one at this time as he is trying to catch up with expenses.  Was unable to pick up epi-pen due to cost.  Is back at job at [employer] part-time that he retired from.  Review of Systems  Constitutional: Negative for fever, appetite change and fatigue.  Eyes: Negative for visual disturbance.  Respiratory: Negative for cough and shortness of breath.   Cardiovascular: Negative for chest pain, palpitations and leg swelling.  Gastrointestinal: Negative for nausea and vomiting.  Skin: Negative.   Neurological: Negative for dizziness and headaches.       Objective:   Physical Exam  Constitutional: He is oriented to person, place, and time. He appears well-developed and well-nourished. No distress.  Blood pressure 140/86, pulse 78, temperature 98.3 F (36.8 C), temperature source Oral, resp. rate 16, height 6' 1.8" (1.875 m), weight 197 lb 6.4 oz (89.54 kg).  HENT:  Head: Normocephalic and atraumatic.  Right Ear: External ear normal.  Left Ear: External ear normal.  Eyes: Conjunctivae are normal. Pupils are equal, round, and reactive to light. Right eye exhibits no discharge. Left eye exhibits no discharge. No scleral icterus.  Neck: Normal range of motion. Neck supple. No JVD present. No thyromegaly present.    Cardiovascular: Normal rate, regular rhythm, normal heart sounds and intact distal pulses.  Exam reveals no gallop and no friction rub.   No murmur heard. Pulmonary/Chest: Effort normal and breath sounds normal. No respiratory distress. He has no wheezes. He has no rales.  Abdominal: Soft. Bowel sounds are normal. He exhibits no distension. There is no tenderness. There is no rebound and no guarding.  Musculoskeletal: He exhibits no edema.  Lymphadenopathy:    He has no cervical adenopathy.  Neurological: He is alert and oriented to person, place, and time.  Skin: Skin is warm and dry. No rash noted. He is not diaphoretic. No erythema. No pallor.  Psychiatric: He has a normal mood and affect. His behavior is normal. Judgment and thought content normal.       Assessment & Plan:  1. Essential hypertension Continue metoprolol 50 mg, HCTZ 25 mg, and amlodipine 10 mg daily. Continue with smoking cessation. Discussed lifestyle modifications. Will re-visit seeing cardiologist when he comes in for BP recheck in 4 months, 02/2015.   Alveta Heimlich PA-C  [facility] 10/24/2014 2:37 PM

## 2014-12-02 ENCOUNTER — Ambulatory Visit (INDEPENDENT_AMBULATORY_CARE_PROVIDER_SITE_OTHER): Payer: Medicare HMO | Admitting: Family Medicine

## 2014-12-02 VITALS — BP 140/72 | HR 67 | Temp 97.7°F | Resp 18 | Ht 74.0 in | Wt 200.0 lb

## 2014-12-02 DIAGNOSIS — R490 Dysphonia: Secondary | ICD-10-CM | POA: Diagnosis not present

## 2014-12-02 DIAGNOSIS — K0510 Chronic gingivitis, plaque induced: Secondary | ICD-10-CM

## 2014-12-02 MED ORDER — PREDNISONE 20 MG PO TABS: ORAL_TABLET | ORAL | Status: DC

## 2014-12-02 NOTE — Patient Instructions (Signed)
Hoarseness Hoarseness is produced from a variety of causes. It is important to find the cause so it can be treated. In the absence of a cold or upper respiratory illness, any hoarseness lasting more than 2 weeks should be looked at by a specialist. This is especially important if you have a history of smoking or alcohol use. It is also important to keep in mind that as you grow older, your voice will naturally get weaker, making it easier for you to become hoarse from straining your vocal cords.  CAUSES  Any illness that affects your vocal cords can result in a hoarse voice. Examples of conditions that can affect the vocal cords are listed as follows:   Allergies.  Colds.  Sinusitis.  Gastroesophageal reflux disease.  Croup.  Injury.  Nodules.  Exposure to smoke or toxic fumes or gases.  Congenital and genetic defects.  Paralysis of the vocal cords.  Infections.  Advanced age. DIAGNOSIS  In order to diagnose the cause of your hoarseness, your caregiver will examine your throat using an instrument that uses a tube with a small lighted camera (laryngoscope). It allows your caregiver to look into the mouth and down the throat. TREATMENT  For most cases, treatment will focus on the specific cause of the hoarseness. Depending on the cause, hoarseness can be a temporary condition (acute) or it can be long lasting (chronic). Most cases of hoarseness clear up without complications. Your caregiver will explain to you if this is not likely to happen. SEEK IMMEDIATE MEDICAL CARE IF:   You have increasing hoarseness or loss of voice.  You have shortness of breath.  You are coughing up blood.  There is pain in your neck or throat. Document Released: 02/01/2005 Document Revised: 05/13/2011 Document Reviewed: 04/26/2010 Exit[employer] Patient Information 2015 [employer]. This information is not intended to replace advice given to you by your health care provider. Make sure you discuss any  questions you have with your health care provider.  

## 2014-12-02 NOTE — Progress Notes (Signed)
This chart was scribed for Robyn Haber, MD by Moises Blood, medical scribe at [facility].The patient was seen in exam room 8 and the patient's care was started at 8:46 AM.  Patient ID: Nathan Stafford MRN: [id number], DOB: 07/12/1950, 64 y.o. Date of Encounter: 12/02/2014  Primary Physician: Robyn Haber, MD  Chief Complaint:  Chief Complaint  Patient presents with   Sore Throat    x 3 weeks    Nasal Congestion    comes and goes    Hoarse    comes and goes 3 weeks    Cough    HPI:  Nathan Stafford is a 64 y.o. male who presents to [facility] complaining of sore throat with nasal congestion and cough for 3 weeks now. He's been coughing up some phlegm as well. He also notes that he would become hoarse every now and then. He would be talking and his voice would become hoarse.   He still smokes, down to half a pack a week. He denies pain when swallowing.  He is retired now.   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   HLD (hyperlipidemia)    GERD (gastroesophageal reflux disease)      Home Meds: Prior to Admission medications   Medication Sig Start Date End Date Taking? Authorizing Provider  amLODipine (NORVASC) 10 MG tablet Take 1 tablet (10 mg total) by mouth daily. 09/08/14  Yes Tishira R Brewington, PA-C  aspirin 81 MG tablet Take 81 mg by mouth daily.   Yes Historical Provider, MD  brimonidine (ALPHAGAN) 0.2 % ophthalmic solution 1 drop 3 (three) times daily.   Yes Historical Provider, MD  dorzolamide-timolol (COSOPT) 22.3-6.8 MG/ML ophthalmic solution 1 drop 2 (two) times daily.   Yes Historical Provider, MD  hydrochlorothiazide  (HYDRODIURIL) 25 MG tablet Take 1 tablet (25 mg total) by mouth daily. 09/08/14  Yes Tishira R Brewington, PA-C  metoprolol succinate (TOPROL-XL) 50 MG 24 hr tablet TAKE ONE TABLET BY MOUTH ONCE DAILY 04/15/14  Yes Tishira R Brewington, PA-C  simvastatin (ZOCOR) 40 MG tablet TAKE ONE TABLET BY MOUTH ONCE DAILY AT BEDTIME 04/15/14  Yes Tishira R Brewington, PA-C    Allergies:  Allergies  Allergen Reactions   Ace Inhibitors Swelling    Social History   Social History   Marital Status: Married    Spouse Name: N/A   Number of Children: 1   Years of Education: N/A   Occupational History   Receiving Clerk    Social History Main Topics   Smoking status: Current Every Day Smoker -- 0.10 packs/day for 30 years    Types: Cigarettes   Smokeless tobacco: Never Used   Alcohol Use: 0.6 - 1.2 oz/week    0 Standard drinks or equivalent, 1-2 Cans of beer per week   Drug Use: No   Sexual Activity: Yes   Other Topics Concern   Not on file   Social History Narrative   Lives with his wife.     Review of Systems: Constitutional: negative for chills, fever, night sweats, weight changes, or fatigue  HEENT: negative for vision changes, hearing loss, rhinorrhea, epistaxis, or sinus pressure; positive  for sore throat, nasal congestion Cardiovascular: negative for chest pain or palpitations Respiratory: negative for hemoptysis, shortness of breath; positive for cough, wheezing Abdominal: negative for abdominal pain, nausea, vomiting, diarrhea, or constipation Dermatological: negative for rash Neurologic: negative for headache, dizziness, or syncope All other systems reviewed and are otherwise negative with the exception to those above and in the HPI.  Physical Exam: Blood pressure 140/72, pulse 67, temperature 97.7 F (36.5 C), temperature source Oral, resp. rate 18, height 6\' 2"  (1.88 m), weight 200 lb (90.719 kg), SpO2 98 %., Body mass index is 25.67 kg/(m^2). General: Well developed,  well nourished, in no acute distress. Head: Normocephalic, atraumatic, eyes without discharge, sclera non-icteric, nares are without discharge. Bilateral auditory canals clear, TM's are without perforation, pearly grey and translucent with reflective cone of light bilaterally. Oral cavity moist, posterior pharynx without exudate, erythema, peritonsillar abscess, or post nasal drip.; multiple dental caries, receding gums, raspy voice Neck: Supple. No thyromegaly. Full ROM. No lymphadenopathy. Lungs: Clear bilaterally to auscultation without rales, or rhonchi. Breathing is unlabored; has some wheezes Heart: RRR with S1 S2. No murmurs, rubs, or gallops appreciated. Msk:  Strength and tone normal for age. Extremities/Skin: Warm and dry. No clubbing or cyanosis. No edema. No rashes or suspicious lesions. Neuro: Alert and oriented X 3. Moves all extremities spontaneously. Gait is normal. CNII-XII grossly in tact. Psych:  Responds to questions appropriately with a normal affect.    ASSESSMENT AND PLAN:  64 y.o. year old male with new onset hoarseness.  This may be allergy related as patient does work part-time in a dusty environment. More concerning is the fact that he does smoke and he has very poor dentition. Also, patient will be getting his insurance straightened out as of October 15. This chart was scribed in my presence and reviewed by me personally.    ICD-9-CM ICD-10-CM   1. Hoarseness 784.42 R49.0 predniSONE (DELTASONE) 20 MG tablet     Ambulatory referral to ENT  2. Gingivitis 523.10 K05.10    I told the patient will be willing to refill his prednisone if symptoms improve but come back before he has his referral to ENT   By signing my name below, I, Moises Blood, attest that this documentation has been prepared under the direction and in the presence of Robyn Haber, MD. Electronically Signed: Moises Blood, [place]. 12/02/2014 , 8:46 AM .  Signed, Robyn Haber,  MD 12/02/2014 8:46 AM

## 2014-12-07 ENCOUNTER — Other Ambulatory Visit: Payer: Self-pay | Admitting: Family Medicine

## 2014-12-10 ENCOUNTER — Other Ambulatory Visit: Payer: Self-pay | Admitting: Physician Assistant

## 2014-12-15 ENCOUNTER — Ambulatory Visit (INDEPENDENT_AMBULATORY_CARE_PROVIDER_SITE_OTHER): Payer: Medicare HMO | Admitting: Family Medicine

## 2014-12-15 VITALS — BP 122/72 | HR 71 | Temp 98.5°F | Resp 17 | Ht 73.5 in | Wt 202.0 lb

## 2014-12-15 DIAGNOSIS — J302 Other seasonal allergic rhinitis: Secondary | ICD-10-CM | POA: Diagnosis not present

## 2014-12-15 DIAGNOSIS — R490 Dysphonia: Secondary | ICD-10-CM

## 2014-12-15 DIAGNOSIS — F17200 Nicotine dependence, unspecified, uncomplicated: Secondary | ICD-10-CM | POA: Diagnosis not present

## 2014-12-15 DIAGNOSIS — R69 Illness, unspecified: Secondary | ICD-10-CM | POA: Diagnosis not present

## 2014-12-15 DIAGNOSIS — K219 Gastro-esophageal reflux disease without esophagitis: Secondary | ICD-10-CM | POA: Diagnosis not present

## 2014-12-15 MED ORDER — CETIRIZINE HCL 10 MG PO TABS: 10.0000 mg | ORAL_TABLET | Freq: Every day | ORAL | Status: DC

## 2014-12-15 NOTE — Progress Notes (Signed)
Chief Complaint:  Chief Complaint  Patient presents with  . Medication Refill    patient does not know which ones   . Sore Throat    HPI: Nathan Stafford is a 64 y.o. male who reports to [facility] today complaining of refills for prednisone that was given to him for hoarseness, he did not have URI sxs but is a smoker, he states the prednisone did make him feel better but he still has hoarseness, he had sxs for 1 month prior to see [place] 1 month ago. He still smokes, has GERD, has allergies. Has not tried anything. He doe snot want to quit smoking. He is not having CP or SOB. He has no fevers or chills. He was referred to ENT but then he declined appt because he did not know if medicare would pay for it. He also has glaucoma.   Past Medical History  Diagnosis Date  . Glaucoma   . Perinephric hematoma   . Hematuria   . HTN (hypertension)   . 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  Marland Kitchen HLD (hyperlipidemia)   . GERD (gastroesophageal reflux disease)    Past Surgical History  Procedure Laterality Date  . Colonscopy    . Lasik     Social History   Social History  . Marital Status: Married    Spouse Name: N/A  . Number of Children: 1  . Years of Education: N/A   Occupational History  . Receiving Clerk    Social History Main Topics  . Smoking status: Current Every Day Smoker -- 0.10 packs/day for 30 years    Types: Cigarettes  . Smokeless tobacco: Never Used  . Alcohol Use: 0.6 - 1.2 oz/week    0 Standard drinks or equivalent, 1-2 Cans of beer per week  . Drug Use: No  . Sexual Activity: Yes   Other Topics Concern  . None   Social History Narrative   Lives with his wife.   Family History  Problem Relation Age of Onset  . Diabetes Father    Allergies  Allergen Reactions  . Ace Inhibitors  Swelling   Prior to Admission medications   Medication Sig Start Date End Date Taking? Authorizing Provider  amLODipine (NORVASC) 10 MG tablet Take 1 tablet (10 mg total) by mouth daily. 09/08/14  Yes Tishira R Brewington, PA-C  aspirin 81 MG tablet Take 81 mg by mouth daily.   Yes Historical Provider, MD  brimonidine (ALPHAGAN) 0.2 % ophthalmic solution 1 drop 3 (three) times daily.   Yes Historical Provider, MD  dorzolamide-timolol (COSOPT) 22.3-6.8 MG/ML ophthalmic solution 1 drop 2 (two) times daily.   Yes Historical Provider, MD  hydrochlorothiazide (HYDRODIURIL) 25 MG tablet Take 1 tablet (25 mg total) by mouth daily. 09/08/14  Yes Tishira R Brewington, PA-C  metoprolol succinate (TOPROL-XL) 50 MG 24 hr tablet TAKE ONE TABLET BY MOUTH ONCE DAILY 04/15/14  Yes Tishira R Brewington, PA-C  predniSONE (DELTASONE) 20 MG tablet Two daily with food 12/02/14  Yes Robyn Haber, MD  simvastatin (ZOCOR) 40 MG tablet TAKE ONE TABLET BY MOUTH ONCE DAILY AT BEDTIME 04/15/14  Yes Tishira R Brewington, PA-C  cetirizine (ZYRTEC) 10 MG tablet Take 1 tablet (10 mg total) by mouth daily. 12/15/14   Amillion Scobee P Katiejo Gilroy, DO     ROS: The  patient denies fevers, chills, night sweats, unintentional weight loss, chest pain, palpitations, wheezing, dyspnea on exertion, nausea, vomiting, abdominal pain, dysuria, hematuria, melena, numbness, weakness, or tingling.   All other systems have been reviewed and were otherwise negative with the exception of those mentioned in the HPI and as above.    PHYSICAL EXAM: Filed Vitals:   12/15/14 0837  BP: 122/72  Pulse: 71  Temp: 98.5 F (36.9 C)  Resp: 17   SpO2 Readings from Last 3 Encounters:  12/15/14 98%  12/02/14 98%  04/15/14 100%    Body mass index is 26.29 kg/(m^2).   General: Alert, no acute distress HEENT:  Normocephalic, atraumatic, oropharynx patent. EOMI, PERRLA + gingivitis, poor dentition. TM normal Cardiovascular:  Regular rate and rhythm, no rubs murmurs or  gallops.  No Carotid bruits, radial pulse intact. No pedal edema.  Respiratory: Clear to auscultation bilaterally.  No wheezes, rales, or rhonchi.  No cyanosis, no use of accessory musculature Abdominal: No organomegaly, abdomen is soft and non-tender, positive bowel sounds. No masses. Skin: No rashes. Neurologic: Facial musculature symmetric. Psychiatric: Patient acts appropriately throughout our interaction. Lymphatic: No cervical or submandibular lymphadenopathy Musculoskeletal: Gait intact. No edema, tenderness   LABS: Results for orders placed or performed in visit on 04/15/14  Comprehensive metabolic panel  Result Value Ref Range   Sodium 143 135 - 145 mEq/L   Potassium 4.3 3.5 - 5.3 mEq/L   Chloride 105 96 - 112 mEq/L   CO2 30 19 - 32 mEq/L   Glucose, Bld 107 (H) 70 - 99 mg/dL   BUN 14 6 - 23 mg/dL   Creat 0.95 0.50 - 1.35 mg/dL   Total Bilirubin 0.7 0.2 - 1.2 mg/dL   Alkaline Phosphatase 69 39 - 117 U/L   AST 57 (H) 0 - 37 U/L   ALT 66 (H) 0 - 53 U/L   Total Protein 7.9 6.0 - 8.3 g/dL   Albumin 4.6 3.5 - 5.2 g/dL   Calcium 10.2 8.4 - 10.5 mg/dL  Lipid panel  Result Value Ref Range   Cholesterol 184 0 - 200 mg/dL   Triglycerides 96 <150 mg/dL   HDL 53 >39 mg/dL   Total CHOL/HDL Ratio 3.5 Ratio   VLDL 19 0 - 40 mg/dL   LDL Cholesterol 112 (H) 0 - 99 mg/dL  PSA  Result Value Ref Range   PSA 1.42 <=4.00 ng/mL  POCT urinalysis dipstick  Result Value Ref Range   Color, UA yellow    Clarity, UA clear    Glucose, UA neg    Bilirubin, UA neg    Ketones, UA neg    Spec Grav, UA 1.020    Blood, UA neg    pH, UA 6.5    Protein, UA neg    Urobilinogen, UA 2.0    Nitrite, UA neg    Leukocytes, UA Negative   POCT CBC  Result Value Ref Range   WBC 6.6 4.6 - 10.2 K/uL   Lymph, poc 2.6 0.6 - 3.4   POC LYMPH PERCENT 39.5 10 - 50 %L   MID (cbc) 0.5 0 - 0.9   POC MID % 7.3 0 - 12 %M   POC Granulocyte 3.5 2 - 6.9   Granulocyte percent 53.2 37 - 80 %G   RBC 5.27 4.69 -  6.13 M/uL   Hemoglobin 15.4 14.1 - 18.1 g/dL   HCT, POC 48.1 43.5 - 53.7 %   MCV 91.3 80 - 97 fL   MCH,  POC 29.2 27 - 31.2 pg   MCHC 32.0 31.8 - 35.4 g/dL   RDW, POC 16.6 %   Platelet Count, POC 154 142 - 424 K/uL   MPV 8.5 0 - 99.8 fL  IFOBT POC (occult bld, rslt in office)  Result Value Ref Range   IFOBT Negative      EKG/XRAY:   Primary read interpreted by Dr. Marin Comment at [facility]   ASSESSMENT/PLAN: Encounter Diagnoses  Name Primary?  . Hoarseness Yes  . Gastroesophageal reflux disease, esophagitis presence not specified   . Tobacco use disorder   . Seasonal allergies    Rx zyrtec Refer to ENT again I prefer not to give him steroids since he has glaucoma and this is masking cause of sxs, he will try allergy meds and also possible GERD meds Advise to stop smoking FU prn    Gross sideeffects, risk and benefits, and alternatives of medications d/w patient. Patient is aware that all medications have potential sideeffects and we are unable to predict every sideeffect or drug-drug interaction that may occur.  Beauregard Jarrells DO  12/15/2014 9:19 AM

## 2014-12-21 DIAGNOSIS — R499 Unspecified voice and resonance disorder: Secondary | ICD-10-CM | POA: Diagnosis not present

## 2014-12-21 DIAGNOSIS — J387 Other diseases of larynx: Secondary | ICD-10-CM | POA: Diagnosis not present

## 2015-01-31 DIAGNOSIS — J381 Polyp of vocal cord and larynx: Secondary | ICD-10-CM | POA: Diagnosis not present

## 2015-01-31 DIAGNOSIS — R499 Unspecified voice and resonance disorder: Secondary | ICD-10-CM | POA: Diagnosis not present

## 2015-02-17 ENCOUNTER — Other Ambulatory Visit: Payer: Self-pay | Admitting: Otolaryngology

## 2015-02-17 DIAGNOSIS — J387 Other diseases of larynx: Secondary | ICD-10-CM | POA: Diagnosis not present

## 2015-02-17 DIAGNOSIS — J383 Other diseases of vocal cords: Secondary | ICD-10-CM | POA: Diagnosis not present

## 2015-03-06 ENCOUNTER — Ambulatory Visit: Payer: Medicare HMO | Admitting: Physician Assistant

## 2015-03-09 ENCOUNTER — Ambulatory Visit: Payer: Self-pay | Admitting: Urgent Care

## 2015-03-16 ENCOUNTER — Telehealth: Payer: Self-pay

## 2015-03-16 DIAGNOSIS — I10 Essential (primary) hypertension: Secondary | ICD-10-CM

## 2015-03-16 MED ORDER — AMLODIPINE BESYLATE 10 MG PO TABS: 10.0000 mg | ORAL_TABLET | Freq: Every day | ORAL | Status: DC

## 2015-03-16 NOTE — Telephone Encounter (Signed)
Refilled amlodipine 

## 2015-03-21 DIAGNOSIS — H5213 Myopia, bilateral: Secondary | ICD-10-CM | POA: Diagnosis not present

## 2015-03-21 DIAGNOSIS — H401121 Primary open-angle glaucoma, left eye, mild stage: Secondary | ICD-10-CM | POA: Diagnosis not present

## 2015-03-21 DIAGNOSIS — H401113 Primary open-angle glaucoma, right eye, severe stage: Secondary | ICD-10-CM | POA: Diagnosis not present

## 2015-04-20 ENCOUNTER — Other Ambulatory Visit: Payer: Self-pay | Admitting: Physician Assistant

## 2015-04-26 ENCOUNTER — Encounter: Payer: Self-pay | Admitting: Urgent Care

## 2015-04-26 ENCOUNTER — Telehealth: Payer: Self-pay | Admitting: Family Medicine

## 2015-04-26 ENCOUNTER — Ambulatory Visit (INDEPENDENT_AMBULATORY_CARE_PROVIDER_SITE_OTHER): Payer: Medicare HMO | Admitting: Urgent Care

## 2015-04-26 VITALS — BP 146/76 | HR 81 | Temp 97.9°F | Resp 16 | Ht 73.75 in | Wt 201.6 lb

## 2015-04-26 DIAGNOSIS — E782 Mixed hyperlipidemia: Secondary | ICD-10-CM | POA: Diagnosis not present

## 2015-04-26 DIAGNOSIS — D170 Benign lipomatous neoplasm of skin and subcutaneous tissue of head, face and neck: Secondary | ICD-10-CM

## 2015-04-26 DIAGNOSIS — K219 Gastro-esophageal reflux disease without esophagitis: Secondary | ICD-10-CM | POA: Diagnosis not present

## 2015-04-26 DIAGNOSIS — H409 Unspecified glaucoma: Secondary | ICD-10-CM

## 2015-04-26 DIAGNOSIS — Z0000 Encounter for general adult medical examination without abnormal findings: Secondary | ICD-10-CM | POA: Diagnosis not present

## 2015-04-26 DIAGNOSIS — Z1211 Encounter for screening for malignant neoplasm of colon: Secondary | ICD-10-CM

## 2015-04-26 DIAGNOSIS — I10 Essential (primary) hypertension: Secondary | ICD-10-CM

## 2015-04-26 DIAGNOSIS — Z23 Encounter for immunization: Secondary | ICD-10-CM | POA: Diagnosis not present

## 2015-04-26 LAB — COMPLETE METABOLIC PANEL WITH GFR
ALT: 33 U/L (ref 9–46)
AST: 29 U/L (ref 10–35)
Albumin: 4.5 g/dL (ref 3.6–5.1)
Alkaline Phosphatase: 75 U/L (ref 40–115)
BILIRUBIN TOTAL: 0.7 mg/dL (ref 0.2–1.2)
BUN: 9 mg/dL (ref 7–25)
CALCIUM: 10 mg/dL (ref 8.6–10.3)
CHLORIDE: 101 mmol/L (ref 98–110)
CO2: 28 mmol/L (ref 20–31)
CREATININE: 1.03 mg/dL (ref 0.70–1.25)
GFR, EST AFRICAN AMERICAN: 88 mL/min (ref >=60)
GFR, Est Non African American: 76 mL/min (ref >=60)
Glucose, Bld: 103 mg/dL — ABNORMAL HIGH (ref 65–99)
Potassium: 4.2 mmol/L (ref 3.5–5.3)
Sodium: 139 mmol/L (ref 135–146)
TOTAL PROTEIN: 7.7 g/dL (ref 6.1–8.1)

## 2015-04-26 LAB — LIPID PANEL
CHOL/HDL RATIO: 4.2 ratio (ref <=5.0)
Cholesterol: 178 mg/dL (ref 125–200)
HDL: 42 mg/dL (ref >=40)
LDL Cholesterol: 112 mg/dL (ref <130)
Triglycerides: 121 mg/dL (ref <150)
VLDL: 24 mg/dL (ref <30)

## 2015-04-26 LAB — CBC
HCT: 45.4 % (ref 39.0–52.0)
Hemoglobin: 15 g/dL (ref 13.0–17.0)
MCH: 28.5 pg (ref 26.0–34.0)
MCHC: 33 g/dL (ref 30.0–36.0)
MCV: 86.3 fL (ref 78.0–100.0)
MPV: 11.3 fL (ref 8.6–12.4)
PLATELETS: 218 10*3/uL (ref 150–400)
RBC: 5.26 MIL/uL (ref 4.22–5.81)
RDW: 14.3 % (ref 11.5–15.5)
WBC: 7.3 10*3/uL (ref 4.0–10.5)

## 2015-04-26 MED ORDER — METOPROLOL SUCCINATE ER 50 MG PO TB24: 50.0000 mg | ORAL_TABLET | Freq: Every day | ORAL | Status: DC

## 2015-04-26 MED ORDER — AMLODIPINE BESYLATE 10 MG PO TABS: 10.0000 mg | ORAL_TABLET | Freq: Every day | ORAL | Status: DC

## 2015-04-26 MED ORDER — SIMVASTATIN 20 MG PO TABS: 20.0000 mg | ORAL_TABLET | Freq: Every day | ORAL | Status: DC

## 2015-04-26 NOTE — Progress Notes (Signed)
MRN: [id number]  Subjective:   [name] is a 65 y.o. male presenting for annual physical exam and GERD.  Medical care team includes: PCP: Robyn Haber, MD Vision: Has glaucoma, sees Dr. Nicki Reaper for this. Dental: No dental care due to insurance coverage. Patient states that he is going to try and get supplemental insurance to help get himself dental care. Specialists: Sees an ENT specialist for hoarse voice. He had f/u in 01/2015 and needs to schedule 06/2015. Up to now, there are no signs of cancer but he will f/u soon. He last saw a cardiologist in 2013, cleared by PA Richardson Dopp. In the past, he has also seen Dr. Percival Spanish.  Patient is happily married, has a good relationship. He works in Solicitor at [employer]. He is able to take care of himself at home. He is working on cutting back his smoking, currently down to 1/2ppd. Has a >20 pack year history. Has 1 alcoholic drink per week.   HTN - take amlodipine, HCTZ, metoprolol. Denies ROS as below. Avoids added salt in his diet. Tries to stay active.  HL - takes simvastatin for this. Currently does 1/2 tablet of 40mg . Diet is not actively healthy. Admits that he eats whatever his wife cooks but tries not to eat fatty and greasy foods.  Gas - reports that he eats plenty of GERD causing foods and causes him gas. These foods include spaghetti, spicy foods. He was previously seen for this and recommended to take a GERD reducing medication. It has helped but he has not stopped eating the foods that give him gas and he is not consistently taking his medication.  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  Nathan Stafford has a current medication list which includes the following prescription(s): amlodipine, aspirin, brimonidine, dorzolamide-timolol, hydrochlorothiazide, metoprolol succinate, and simvastatin. He is allergic to ace  inhibitors.  Nathan Stafford  has a past medical history of Glaucoma; Perinephric hematoma; Hematuria; HTN (hypertension); Cardiomyopathy secondary; HLD (hyperlipidemia); and GERD (gastroesophageal reflux disease). Also  has past surgical history that includes colonscopy; LASIK; and Eye surgery (Right, 1993).  His family history includes Diabetes in his father.  Immunizations: Declines flu shot. Reluctantly agrees to [place] [address].  Review of Systems  Constitutional: Negative for fever, chills, weight loss, malaise/fatigue and diaphoresis.  HENT: Negative for congestion, ear discharge, ear pain, hearing loss, nosebleeds, sore throat and tinnitus.   Eyes: Negative for blurred vision, double vision, photophobia, pain, discharge and redness.  Respiratory: Negative for cough, shortness of breath and wheezing.   Cardiovascular: Negative for chest pain, palpitations and leg swelling.  Gastrointestinal: Positive for heartburn. Negative for nausea, vomiting, abdominal pain, diarrhea, constipation and blood in stool.  Genitourinary: Negative for dysuria, urgency, frequency, hematuria and flank pain.  Musculoskeletal: Negative for myalgias, back pain and joint pain.  Skin: Negative for itching and rash.  Neurological: Negative for dizziness, tingling, seizures, loss of consciousness, weakness and headaches.  Endo/Heme/Allergies: Negative for polydipsia.  Psychiatric/Behavioral: Negative for depression, suicidal ideas, hallucinations, memory loss and substance abuse. The patient is not nervous/anxious and does not have insomnia.     Objective:   Vitals: BP 146/76 mmHg  Pulse 81  Temp(Src) 97.9 F (36.6 C)  Resp 16  Ht 6' 1.75" (1.873 m)  Wt 201 lb 9.6 oz (91.445 kg)  BMI 26.07 kg/m2  SpO2 97%  BP Readings from Last 3 Encounters:  04/26/15 146/76  12/15/14 122/72  12/02/14 140/72   Physical Exam  Constitutional: He is oriented to person, place, and time. He appears well-developed and well-nourished.    HENT:  Head:    TM's intact bilaterally, no effusions or erythema. Nasal turbinates pink and moist, nasal passages patent. No sinus tenderness. Oropharynx clear, mucous membranes moist, dentition with multiple areas of plaque and thinning of gingiva.  Eyes: Conjunctivae and EOM are normal. Pupils are equal, round, and reactive to light. Right eye exhibits no discharge. Left eye exhibits no discharge. No scleral icterus.  Neck: Normal range of motion. Neck supple. No thyromegaly present.  Cardiovascular: Normal rate, regular rhythm and intact distal pulses.  Exam reveals no gallop and no friction rub.   No murmur heard. Pulmonary/Chest: No stridor. No respiratory distress. He has no wheezes. He has no rales.  Abdominal: Soft. Bowel sounds are normal. He exhibits no distension and no mass. There is no tenderness.  Musculoskeletal: Normal range of motion. He exhibits no edema or tenderness.  Lymphadenopathy:    He has no cervical adenopathy.  Neurological: He is alert and oriented to person, place, and time. He has normal reflexes. Coordination normal.  Skin: Skin is warm and dry. No rash noted. No erythema. No pallor.  Psychiatric: He has a normal mood and affect.   Assessment and Plan :   1. Encounter for Medicare annual wellness exam - Patient is medically stable, discussed healthy lifestyle, diet, exercise, preventative care, vaccinations, and addressed patient's concerns.  - CMA Rush Farmer worked with the patient to try and schedule his colonoscopy with Dr. Collene Mares. Apparently, he has not had this done since 2006. Dr. Lorie Apley office stated that they would contact the patient to schedule him for a repeat.  2. Essential hypertension - Labs pending, patient will f/u in 6 months. Consider adding lisinopril if BP remains 0000000 systolic.  3. Mixed hyperlipidemia - Lipid panel pending, wrote script for 20mg  simvastatin  4. Lipoma of face - Patient prefers to monitor.  5. Gastroesophageal  reflux disease without esophagitis - Counseled on dietary modifications, medications he can use for GERD.  6. Glaucoma - Continue f/u with Dr. Nicki Reaper.  7. Need for prophylactic vaccination against Streptococcus pneumoniae (pneumococcus) - Pneumococcal conjugate vaccine 13-valent IM   Jaynee Eagles, PA-C [facility] [phone] 04/26/2015  2:04 PM

## 2015-04-26 NOTE — Patient Instructions (Addendum)
Keeping you healthy  Get these tests  Blood pressure- Have your blood pressure checked once a year by your healthcare provider.  Normal blood pressure is 120/80  Weight- Have your body mass index (BMI) calculated to screen for obesity.  BMI is a measure of body fat based on height and weight. You can also calculate your own BMI at View[employer].  Cholesterol- Have your cholesterol checked every year.  Diabetes- Have your blood sugar checked regularly if you have high blood pressure, high cholesterol, have a family history of diabetes or if you are overweight.  Screening for Colon Cancer- Colonoscopy starting at age 76.  Screening may begin sooner depending on your family history and other health conditions. Follow up colonoscopy as directed by your Gastroenterologist.  Screening for Prostate Cancer- Both blood work (PSA) and a rectal exam help screen for Prostate Cancer.  Screening begins at age 41 with African-American men and at age 13 with Caucasian men.  Screening may begin sooner depending on your family history.  Take these medicines  Aspirin- One aspirin daily can help prevent Heart disease and Stroke.  Flu shot- Every fall.  Tetanus- Every 10 years.  Zostavax- Once after the age of 83 to prevent Shingles.  Pneumonia shot- Once after the age of 33; if you are younger than 85, ask your healthcare provider if you need a Pneumonia shot.  Take these steps  Don't smoke- If you do smoke, talk to your doctor about quitting.  For tips on how to quit, go to www.smokefree.gov or call 1-800-QUIT-NOW.  Be physically active- Exercise 5 days a week for at least 30 minutes.  If you are not already physically active start slow and gradually work up to 30 minutes of moderate physical activity.  Examples of moderate activity include walking briskly, mowing the yard, dancing, swimming, bicycling, etc.  Eat a healthy diet- Eat a variety of healthy food such as fruits, vegetables, low  fat milk, low fat cheese, yogurt, lean meant, poultry, fish, beans, tofu, etc. For more information go to www.thenutritionsource.org  Drink alcohol in moderation- Limit alcohol intake to less than two drinks a day. Never drink and drive.  Dentist- Brush and floss twice daily; visit your dentist twice a year.  Depression- Your emotional health is as important as your physical health. If you're feeling down, or losing interest in things you would normally enjoy please talk to your healthcare provider.  Eye exam- Visit your eye doctor every year.  Safe sex- If you may be exposed to a sexually transmitted infection, use a condom.  Seat belts- Seat belts can save your life; always wear one.  Smoke/Carbon Monoxide detectors- These detectors need to be installed on the appropriate level of your home.  Replace batteries at least once a year.  Skin cancer- When out in the sun, cover up and use sunscreen 15 SPF or higher.  Violence- If anyone is threatening you, please tell your healthcare provider.  Living Will/ Health care power of attorney- Speak with your healthcare provider and family.    FOR REFLUX, YOU CAN TAKE ONE OF THE FOLLOWING: 1. PEPCID AC 2. Blue Grass for Gastroesophageal Reflux Disease, Adult When you have gastroesophageal reflux disease (GERD), the foods you eat and your eating habits are very important. Choosing the right foods can help ease the discomfort of GERD. WHAT GENERAL GUIDELINES DO I NEED TO FOLLOW?  Choose fruits, vegetables, whole grains, low-fat dairy products, and low-fat meat, fish,  and poultry.  Limit fats such as oils, salad dressings, butter, nuts, and avocado.  Keep a food diary to identify foods that cause symptoms.  Avoid foods that cause reflux. These may be different for different people.  Eat frequent small meals instead of three large meals each day.  Eat your meals slowly, in a relaxed setting.  Limit  fried foods.  Cook foods using methods other than frying.  Avoid drinking alcohol.  Avoid drinking large amounts of liquids with your meals.  Avoid bending over or lying down until 2-3 hours after eating. WHAT FOODS ARE NOT RECOMMENDED? The following are some foods and drinks that may worsen your symptoms: Vegetables Tomatoes. Tomato juice. Tomato and spaghetti sauce. Chili peppers. Onion and garlic. Horseradish. Fruits Oranges, grapefruit, and lemon (fruit and juice). Meats High-fat meats, fish, and poultry. This includes hot dogs, ribs, ham, sausage, salami, and bacon. Dairy Whole milk and chocolate milk. Sour cream. Cream. Butter. Ice cream. Cream cheese.  Beverages Coffee and tea, with or without caffeine. Carbonated beverages or energy drinks. Condiments Hot sauce. Barbecue sauce.  Sweets/Desserts Chocolate and cocoa. Donuts. Peppermint and spearmint. Fats and Oils High-fat foods, including [place] fries and potato chips. Other Vinegar. Strong spices, such as black pepper, white pepper, red pepper, cayenne, curry powder, cloves, ginger, and chili powder. The items listed above may not be a complete list of foods and beverages to avoid. Contact your dietitian for more information.   This information is not intended to replace advice given to you by your health care provider. Make sure you discuss any questions you have with your health care provider.   Document Released: 02/18/2005 Document Revised: 03/11/2014 Document Reviewed: 12/23/2012 Elsevier Interactive Patient Education 2016 [employer].    Hypertension Hypertension, commonly called high blood pressure, is when the force of blood pumping through your arteries is too strong. Your arteries are the blood vessels that carry blood from your heart throughout your body. A blood pressure reading consists of a higher number over a lower number, such as 110/72. The higher number (systolic) is the pressure inside your  arteries when your heart pumps. The lower number (diastolic) is the pressure inside your arteries when your heart relaxes. Ideally you want your blood pressure below 120/80. Hypertension forces your heart to work harder to pump blood. Your arteries may become narrow or stiff. Having untreated or uncontrolled hypertension can cause heart attack, stroke, kidney disease, and other problems. RISK FACTORS Some risk factors for high blood pressure are controllable. Others are not.  Risk factors you cannot control include:   Race. You may be at higher risk if you are African American.  Age. Risk increases with age.  Gender. Men are at higher risk than women before age 51 years. After age 48, women are at higher risk than men. Risk factors you can control include:  Not getting enough exercise or physical activity.  Being overweight.  Getting too much fat, sugar, calories, or salt in your diet.  Drinking too much alcohol. SIGNS AND SYMPTOMS Hypertension does not usually cause signs or symptoms. Extremely high blood pressure (hypertensive crisis) may cause headache, anxiety, shortness of breath, and nosebleed. DIAGNOSIS To check if you have hypertension, your health care provider will measure your blood pressure while you are seated, with your arm held at the level of your heart. It should be measured at least twice using the same arm. Certain conditions can cause a difference in blood pressure between your right and left arms.  A blood pressure reading that is higher than normal on one occasion does not mean that you need treatment. If it is not clear whether you have high blood pressure, you may be asked to return on a different day to have your blood pressure checked again. Or, you may be asked to monitor your blood pressure at home for 1 or more weeks. TREATMENT Treating high blood pressure includes making lifestyle changes and possibly taking medicine. Living a healthy lifestyle can help lower  high blood pressure. You may need to change some of your habits. Lifestyle changes may include:  Following the DASH diet. This diet is high in fruits, vegetables, and whole grains. It is low in salt, red meat, and added sugars.  Keep your sodium intake below 2,300 mg per day.  Getting at least 30-45 minutes of aerobic exercise at least 4 times per week.  Losing weight if necessary.  Not smoking.  Limiting alcoholic beverages.  Learning ways to reduce stress. Your health care provider may prescribe medicine if lifestyle changes are not enough to get your blood pressure under control, and if one of the following is true:  You are 75-71 years of age and your systolic blood pressure is above 140.  You are 80 years of age or older, and your systolic blood pressure is above 150.  Your diastolic blood pressure is above 90.  You have diabetes, and your systolic blood pressure is over [id number] or your diastolic blood pressure is over 90.  You have kidney disease and your blood pressure is above 140/90.  You have heart disease and your blood pressure is above 140/90. Your personal target blood pressure may vary depending on your medical conditions, your age, and other factors. HOME CARE INSTRUCTIONS  Have your blood pressure rechecked as directed by your health care provider.   Take medicines only as directed by your health care provider. Follow the directions carefully. Blood pressure medicines must be taken as prescribed. The medicine does not work as well when you skip doses. Skipping doses also puts you at risk for problems.  Do not smoke.   Monitor your blood pressure at home as directed by your health care provider. SEEK MEDICAL CARE IF:   You think you are having a reaction to medicines taken.  You have recurrent headaches or feel dizzy.  You have swelling in your ankles.  You have trouble with your vision. SEEK IMMEDIATE MEDICAL CARE IF:  You develop a severe headache or  confusion.  You have unusual weakness, numbness, or feel faint.  You have severe chest or abdominal pain.  You vomit repeatedly.  You have trouble breathing. MAKE SURE YOU:   Understand these instructions.  Will watch your condition.  Will get help right away if you are not doing well or get worse.   This information is not intended to replace advice given to you by your health care provider. Make sure you discuss any questions you have with your health care provider.   Document Released: 02/18/2005 Document Revised: 07/05/2014 Document Reviewed: 12/11/2012 Elsevier Interactive Patient Education 2016 [employer].    High Cholesterol High cholesterol refers to having a high level of cholesterol in your blood. Cholesterol is a white, waxy, fat-like protein that your body needs in small amounts. Your liver makes all the cholesterol you need. Excess cholesterol comes from the food you eat. Cholesterol travels in your bloodstream through your blood vessels. If you have high cholesterol, deposits (plaque) may build up on  the walls of your blood vessels. This makes the arteries narrower and stiffer. Plaque increases your risk of heart attack and stroke. Work with your health care provider to keep your cholesterol levels in a healthy range. RISK FACTORS Several things can make you more likely to have high cholesterol. These include:   Eating foods high in animal fat (saturated fat) or cholesterol.  Being overweight.  Not getting enough exercise.  Having a family history of high cholesterol. SIGNS AND SYMPTOMS High cholesterol does not cause symptoms. DIAGNOSIS  Your health care provider can do a blood test to check whether you have high cholesterol. If you are older than 20, your health care provider may check your cholesterol every 4-6 years. You may be checked more often if you already have high cholesterol or other risk factors for heart disease. The blood test for cholesterol  measures the following:  Bad cholesterol (LDL cholesterol). This is the type of cholesterol that causes heart disease. This number should be less than 100.  Good cholesterol (HDL cholesterol). This type helps protect against heart disease. A healthy level of HDL cholesterol is 60 or higher.  Total cholesterol. This is the combined number of LDL cholesterol and HDL cholesterol. A healthy number is less than 200. TREATMENT  High cholesterol can be treated with diet changes, lifestyle changes, and medicine.   Diet changes may include eating more whole grains, fruits, vegetables, nuts, and fish. You may also have to cut back on red meat and foods with a lot of added sugar.  Lifestyle changes may include getting at least 40 minutes of aerobic exercise three times a week. Aerobic exercises include walking, biking, and swimming. Aerobic exercise along with a healthy diet can help you maintain a healthy weight. Lifestyle changes may also include quitting smoking.  If diet and lifestyle changes are not enough to lower your cholesterol, your health care provider may prescribe a statin medicine. This medicine has been shown to lower cholesterol and also lower the risk of heart disease. HOME CARE INSTRUCTIONS  Only take over-the-counter or prescription medicines as directed by your health care provider.   Follow a healthy diet as directed by your health care provider. For instance:   Eat chicken (without skin), fish, veal, shellfish, ground [place] breast, and round or loin cuts of red meat.  Do not eat fried foods and fatty meats, such as hot dogs and salami.   Eat plenty of fruits, such as apples.   Eat plenty of vegetables, such as broccoli, potatoes, and carrots.   Eat beans, peas, and lentils.   Eat grains, such as barley, rice, couscous, and bulgur wheat.   Eat pasta without cream sauces.   Use skim or nonfat milk and low-fat or nonfat yogurt and cheeses. Do not eat or drink whole  milk, cream, ice cream, egg yolks, and hard cheeses.   Do not eat stick margarine or tub margarines that contain trans fats (also called partially hydrogenated oils).   Do not eat cakes, cookies, crackers, or other baked goods that contain trans fats.   Do not eat saturated tropical oils, such as coconut and palm oil.   Exercise as directed by your health care provider. Increase your activity level with activities such as gardening or walking.   Keep all follow-up appointments.  SEEK MEDICAL CARE IF:  You are struggling to maintain a healthy diet or weight.  You need help starting an exercise program.  You need help to stop smoking. SEEK IMMEDIATE  MEDICAL CARE IF:  You have chest pain.  You have trouble breathing.   This information is not intended to replace advice given to you by your health care provider. Make sure you discuss any questions you have with your health care provider.   Document Released: 02/18/2005 Document Revised: 03/11/2014 Document Reviewed: 12/11/2012 Elsevier Interactive Patient Education [employer].

## 2015-04-26 NOTE — Telephone Encounter (Signed)
Spoke with patient and he stated that his last colonoscopy was with Dr.Jyothi Collene Mares.

## 2015-04-27 LAB — TSH: TSH: 2.31 m[IU]/L (ref 0.40–4.50)

## 2015-07-11 ENCOUNTER — Other Ambulatory Visit: Payer: Self-pay

## 2015-07-11 MED ORDER — SIMVASTATIN 20 MG PO TABS: 20.0000 mg | ORAL_TABLET | Freq: Every day | ORAL | Status: DC

## 2015-12-26 ENCOUNTER — Other Ambulatory Visit: Payer: Self-pay

## 2015-12-26 DIAGNOSIS — I10 Essential (primary) hypertension: Secondary | ICD-10-CM

## 2015-12-26 MED ORDER — HYDROCHLOROTHIAZIDE 25 MG PO TABS: 25.0000 mg | ORAL_TABLET | Freq: Every day | ORAL | 0 refills | Status: DC

## 2015-12-26 NOTE — Telephone Encounter (Signed)
Last ov and labs 04/2015 Pharmacy requests 90 days for insurance. Advised needs ov to establish with new provider for more refills

## 2015-12-30 ENCOUNTER — Telehealth: Payer: Self-pay | Admitting: *Deleted

## 2015-12-30 NOTE — Telephone Encounter (Signed)
Patient came in and stated that his hctz was not sent to pharmacy.  Called pharmacy and they stated that the rx was there but not ready.  Advised pharmacy to get ready.  Patient notified.

## 2016-04-05 ENCOUNTER — Other Ambulatory Visit: Payer: Self-pay | Admitting: Physician Assistant

## 2016-04-05 DIAGNOSIS — I10 Essential (primary) hypertension: Secondary | ICD-10-CM

## 2016-05-02 ENCOUNTER — Ambulatory Visit (INDEPENDENT_AMBULATORY_CARE_PROVIDER_SITE_OTHER): Payer: Medicare HMO | Admitting: Urgent Care

## 2016-05-02 ENCOUNTER — Encounter: Payer: Self-pay | Admitting: Urgent Care

## 2016-05-02 VITALS — BP 134/70 | HR 85 | Temp 98.1°F | Resp 18 | Ht 73.75 in | Wt 193.0 lb

## 2016-05-02 DIAGNOSIS — I10 Essential (primary) hypertension: Secondary | ICD-10-CM | POA: Diagnosis not present

## 2016-05-02 DIAGNOSIS — K219 Gastro-esophageal reflux disease without esophagitis: Secondary | ICD-10-CM

## 2016-05-02 DIAGNOSIS — H409 Unspecified glaucoma: Secondary | ICD-10-CM | POA: Diagnosis not present

## 2016-05-02 DIAGNOSIS — Z1211 Encounter for screening for malignant neoplasm of colon: Secondary | ICD-10-CM

## 2016-05-02 DIAGNOSIS — E782 Mixed hyperlipidemia: Secondary | ICD-10-CM

## 2016-05-02 DIAGNOSIS — Z0000 Encounter for general adult medical examination without abnormal findings: Secondary | ICD-10-CM

## 2016-05-02 DIAGNOSIS — D170 Benign lipomatous neoplasm of skin and subcutaneous tissue of head, face and neck: Secondary | ICD-10-CM

## 2016-05-02 MED ORDER — HYDROCHLOROTHIAZIDE 25 MG PO TABS: ORAL_TABLET | ORAL | 3 refills | Status: DC

## 2016-05-02 MED ORDER — AMLODIPINE BESYLATE 10 MG PO TABS: 10.0000 mg | ORAL_TABLET | Freq: Every day | ORAL | 3 refills | Status: DC

## 2016-05-02 MED ORDER — METOPROLOL SUCCINATE ER 50 MG PO TB24: 50.0000 mg | ORAL_TABLET | Freq: Every day | ORAL | 3 refills | Status: DC

## 2016-05-02 MED ORDER — SIMVASTATIN 20 MG PO TABS: 20.0000 mg | ORAL_TABLET | Freq: Every day | ORAL | 3 refills | Status: DC

## 2016-05-02 NOTE — Progress Notes (Signed)
MRN: [id number]  Subjective:   [name] is a 66 y.o. male presenting for annual physical exam. Patient is happily married, has good relationships at home, has a good support network. He works in Solicitor at [employer]. He is able to take care of himself at home. He quit smoking 04/26/2016. Has 2 alcoholic drink per week.   Medical care team includes: PCP: Jaynee Eagles, PA-C Vision: Has glaucoma, sees Dr. Nicki Reaper for this. Dental: Does not yet have dental insurance. Specialists: Dr. Collene Mares, his GI doctor called and tried to schedule patient for f/u, is due for repeat colonoscopy. His cardiologist is at [facility], last OV was in 2013. He has not returned for follow up due to cost burden and debt/balance.   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  Nathan Stafford has a current medication list which includes the following prescription(s): amlodipine, aspirin, brimonidine, dorzolamide-timolol, hydrochlorothiazide, latanoprost, simvastatin, and metoprolol succinate. She is allergic to ace inhibitors. Nathan Stafford  has a past medical history of Cardiomyopathy secondary; GERD (gastroesophageal reflux disease); Glaucoma; Hematuria; HLD (hyperlipidemia); HTN (hypertension); and Perinephric hematoma. Also  has a past surgical history that includes colonscopy; LASIK; and Eye surgery (Right, 1993). His family history includes Diabetes in his father.  Immunizations: Refuses flu shot. Prevnar 13 updated 04/2015.  Review of Systems  Constitutional: Negative for chills, diaphoresis, fever, malaise/fatigue and weight loss.  HENT: Negative for congestion, ear discharge, ear pain, hearing loss, nosebleeds, sore throat and tinnitus.   Eyes: Negative for blurred vision, double vision, photophobia, pain, discharge and redness.  Respiratory: Negative for cough, shortness of breath and wheezing.     Cardiovascular: Negative for chest pain, palpitations and leg swelling.  Gastrointestinal: Negative for abdominal pain, blood in stool, constipation, diarrhea, nausea and vomiting.  Genitourinary: Negative for dysuria, flank pain, frequency, hematuria and urgency.  Musculoskeletal: Negative for back pain, joint pain and myalgias.  Skin: Negative for itching and rash.  Neurological: Negative for dizziness, tingling, seizures, loss of consciousness, weakness and headaches.  Endo/Heme/Allergies: Negative for polydipsia.  Psychiatric/Behavioral: Negative for depression, hallucinations, memory loss, substance abuse and suicidal ideas. The patient is not nervous/anxious and does not have insomnia.    Objective:   Vitals: BP 134/70 (BP Location: Right Arm, Patient Position: Sitting, Cuff Size: Small)   Pulse 85   Temp 98.1 F (36.7 C) (Oral)   Resp 18   Ht 6' 1.75" (1.873 m)   Wt 193 lb (87.5 kg)   SpO2 99%   BMI 24.95 kg/m   BP Readings from Last 3 Encounters:  05/02/16 134/70  04/26/15 (!) 146/76  12/15/14 122/72    Physical Exam  Constitutional: He is oriented to person, place, and time. He appears well-developed and well-nourished.  HENT:  TM's intact bilaterally, no effusions or erythema. Nasal turbinates pink and moist, nasal passages patent. No sinus tenderness. Oropharynx clear, mucous membranes moist, dentition in good repair.  Eyes: Conjunctivae and EOM are normal. Pupils are equal, round, and reactive to light. Right eye exhibits no discharge. Left eye exhibits no discharge. No scleral icterus.  Neck: Normal range of motion. Neck supple. No thyromegaly present.  Cardiovascular: Normal rate, regular rhythm and intact distal pulses.  Exam reveals no gallop and no friction rub.   No murmur heard. Pulmonary/Chest: No stridor. No respiratory distress. He has no wheezes. He has no rales.  Abdominal: Soft. Bowel sounds are normal. He exhibits no distension and  no mass. There is  no tenderness.  Musculoskeletal: Normal range of motion. He exhibits no edema or tenderness.  Lymphadenopathy:    He has no cervical adenopathy.  Neurological: He is alert and oriented to person, place, and time. He has normal reflexes.  Skin: Skin is warm and dry. No rash noted. No erythema. No pallor.  Psychiatric: He has a normal mood and affect.   Assessment and Plan :   1. Encounter for Medicare annual wellness exam - Stable, medically healthy, labs pending. Discussed healthy lifestyle, diet, exercise, preventative care, vaccinations, and addressed patient's concerns.    2. Essential hypertension - Labs pending, refills provided. Continue healthy diet, active lifestyle - CMP14+EGFR - metoprolol succinate (TOPROL-XL) 50 MG 24 hr tablet; Take 1 tablet (50 mg total) by mouth daily. Take with or immediately following a meal.  Dispense: 90 tablet; Refill: 3 - hydrochlorothiazide (HYDRODIURIL) 25 MG tablet; Take one tablet by mouth once daily.  Dispense: 90 tablet; Refill: 3 - amLODipine (NORVASC) 10 MG tablet; Take 1 tablet (10 mg total) by mouth daily.  Dispense: 90 tablet; Refill: 3 - Microalbumin/Creatinine Ratio, Urine  3. Mixed hyperlipidemia - Labs pending, refills provided. - Lipid panel - simvastatin (ZOCOR) 20 MG tablet; Take 1 tablet (20 mg total) by mouth at bedtime.  Dispense: 90 tablet; Refill: 3  4. 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  5. Gastroesophageal reflux disease without esophagitis - Controlled, monitor and f/u as needed.  6. Glaucoma of right eye, unspecified glaucoma type - Continue f/u with Dr. Nicki Reaper  7. Screening for colon cancer - Patient will schedule his colonoscopy on his own with Dr. Collene Mares.    Jaynee Eagles, PA-C [facility] [phone] 05/02/2016  8:48 AM

## 2016-05-02 NOTE — Patient Instructions (Addendum)
Keeping you healthy  Get these tests  Blood pressure- Have your blood pressure checked once a year by your healthcare provider.  Normal blood pressure is 120/80  Weight- Have your body mass index (BMI) calculated to screen for obesity.  BMI is a measure of body fat based on height and weight. You can also calculate your own BMI at www.[facility].com/bmi/.  Cholesterol- Have your cholesterol checked every year.  Diabetes- Have your blood sugar checked regularly if you have high blood pressure, high cholesterol, have a family history of diabetes or if you are overweight.  Screening for Colon Cancer- Colonoscopy starting at age 50.  Screening may begin sooner depending on your family history and other health conditions. Follow up colonoscopy as directed by your Gastroenterologist.  Screening for Prostate Cancer- Both blood work (PSA) and a rectal exam help screen for Prostate Cancer.  Screening begins at age 40 with African-American men and at age 50 with Caucasian men.  Screening may begin sooner depending on your family history.  Take these medicines  Aspirin- One aspirin daily can help prevent Heart disease and Stroke.  Flu shot- Every fall.  Tetanus- Every 10 years.  Zostavax- Once after the age of 60 to prevent Shingles.  Pneumonia shot- Once after the age of 65; if you are younger than 65, ask your healthcare provider if you need a Pneumonia shot.  Take these steps  Don't smoke- If you do smoke, talk to your doctor about quitting.  For tips on how to quit, go to www.smokefree.gov or call 1-800-QUIT-NOW.  Be physically active- Exercise 5 days a week for at least 30 minutes.  If you are not already physically active start slow and gradually work up to 30 minutes of moderate physical activity.  Examples of moderate activity include walking briskly, mowing the yard, dancing, swimming, bicycling, etc.  Eat a healthy diet- Eat a variety of healthy food such as fruits, vegetables,  low fat milk, low fat cheese, yogurt, lean meant, poultry, fish, beans, tofu, etc. For more information go to www.thenutritionsource.org  Drink alcohol in moderation- Limit alcohol intake to less than two drinks a day. Never drink and drive.  Dentist- Brush and floss twice daily; visit your dentist twice a year.  Depression- Your emotional health is as important as your physical health. If you're feeling down, or losing interest in things you would normally enjoy please talk to your healthcare provider.  Eye exam- Visit your eye doctor every year.  Safe sex- If you may be exposed to a sexually transmitted infection, use a condom.  Seat belts- Seat belts can save your life; always wear one.  Smoke/Carbon Monoxide detectors- These detectors need to be installed on the appropriate level of your home.  Replace batteries at least once a year.  Skin cancer- When out in the sun, cover up and use sunscreen 15 SPF or higher.  Violence- If anyone is threatening you, please tell your healthcare provider.  Living Will/ Health care power of attorney- Speak with your healthcare provider and family.   IF you received an x-ray today, you will receive an invoice from [facility]. Please contact [facility] at [phone] with questions or concerns regarding your invoice.   IF you received labwork today, you will receive an invoice from [facility]. Please contact [facility] at [phone] with questions or concerns regarding your invoice.   Our billing staff will not be able to assist you with questions regarding bills from these companies.  You will be contacted   with the lab results as soon as they are available. The fastest way to get your results is to activate your My Chart account. Instructions are located on the last page of this paperwork. If you have not heard from us regarding the results in 2 weeks, please contact this office.     

## 2016-05-03 LAB — CMP14+EGFR
ALBUMIN: 4.5 g/dL (ref 3.6–4.8)
ALK PHOS: 76 IU/L (ref 39–117)
ALT: 33 IU/L (ref 0–44)
AST: 43 IU/L — ABNORMAL HIGH (ref 0–40)
Albumin/Globulin Ratio: 1.6 (ref 1.2–2.2)
BILIRUBIN TOTAL: 0.7 mg/dL (ref 0.0–1.2)
BUN / CREAT RATIO: 13 (ref 10–24)
BUN: 10 mg/dL (ref 8–27)
CO2: 26 mmol/L (ref 18–29)
CREATININE: 0.79 mg/dL (ref 0.76–1.27)
Calcium: 9.7 mg/dL (ref 8.6–10.2)
Chloride: 97 mmol/L (ref 96–106)
GFR calc non Af Amer: 94 mL/min/{1.73_m2} (ref >59)
GFR, EST AFRICAN AMERICAN: 108 mL/min/{1.73_m2} (ref >59)
GLOBULIN, TOTAL: 2.8 g/dL (ref 1.5–4.5)
GLUCOSE: 100 mg/dL — AB (ref 65–99)
Potassium: 3.8 mmol/L (ref 3.5–5.2)
SODIUM: 141 mmol/L (ref 134–144)
TOTAL PROTEIN: 7.3 g/dL (ref 6.0–8.5)

## 2016-05-03 LAB — LIPID PANEL
CHOLESTEROL TOTAL: 158 mg/dL (ref 100–199)
Chol/HDL Ratio: 4.2 ratio units (ref 0.0–5.0)
HDL: 38 mg/dL — AB (ref >39)
LDL Calculated: 99 mg/dL (ref 0–99)
Triglycerides: 103 mg/dL (ref 0–149)
VLDL CHOLESTEROL CAL: 21 mg/dL (ref 5–40)

## 2016-05-03 LAB — MICROALBUMIN / CREATININE URINE RATIO
Creatinine, Urine: 176 mg/dL
MICROALBUM., U, RANDOM: 67.4 ug/mL
Microalb/Creat Ratio: 38.3 mg/g creat — ABNORMAL HIGH (ref 0.0–30.0)

## 2016-06-13 DIAGNOSIS — R140 Abdominal distension (gaseous): Secondary | ICD-10-CM | POA: Diagnosis not present

## 2016-06-13 DIAGNOSIS — Z800 Family history of malignant neoplasm of digestive organs: Secondary | ICD-10-CM | POA: Diagnosis not present

## 2016-06-13 DIAGNOSIS — Z1211 Encounter for screening for malignant neoplasm of colon: Secondary | ICD-10-CM | POA: Diagnosis not present

## 2016-06-20 ENCOUNTER — Encounter: Payer: Self-pay | Admitting: Cardiology

## 2016-07-06 ENCOUNTER — Emergency Department (HOSPITAL_COMMUNITY): Payer: Medicare HMO

## 2016-07-06 ENCOUNTER — Emergency Department (HOSPITAL_COMMUNITY)
Admission: EM | Admit: 2016-07-06 | Discharge: 2016-07-06 | Disposition: A | Discharge status: Home / Self Care | Payer: Medicare HMO | Attending: Emergency Medicine | Admitting: Emergency Medicine

## 2016-07-06 ENCOUNTER — Encounter (HOSPITAL_COMMUNITY): Payer: Self-pay | Admitting: Emergency Medicine

## 2016-07-06 DIAGNOSIS — R69 Illness, unspecified: Secondary | ICD-10-CM | POA: Diagnosis not present

## 2016-07-06 DIAGNOSIS — H5712 Ocular pain, left eye: Secondary | ICD-10-CM | POA: Diagnosis present

## 2016-07-06 DIAGNOSIS — L03213 Periorbital cellulitis: Secondary | ICD-10-CM | POA: Diagnosis not present

## 2016-07-06 DIAGNOSIS — F17210 Nicotine dependence, cigarettes, uncomplicated: Secondary | ICD-10-CM | POA: Insufficient documentation

## 2016-07-06 DIAGNOSIS — Z7982 Long term (current) use of aspirin: Secondary | ICD-10-CM | POA: Insufficient documentation

## 2016-07-06 DIAGNOSIS — I10 Essential (primary) hypertension: Secondary | ICD-10-CM | POA: Insufficient documentation

## 2016-07-06 DIAGNOSIS — R220 Localized swelling, mass and lump, head: Secondary | ICD-10-CM | POA: Diagnosis not present

## 2016-07-06 LAB — COMPREHENSIVE METABOLIC PANEL
ALK PHOS: 74 U/L (ref 38–126)
ALT: 20 U/L (ref 17–63)
ANION GAP: 10 (ref 5–15)
AST: 22 U/L (ref 15–41)
Albumin: 3.9 g/dL (ref 3.5–5.0)
BILIRUBIN TOTAL: 0.6 mg/dL (ref 0.3–1.2)
BUN: 10 mg/dL (ref 6–20)
CALCIUM: 9.3 mg/dL (ref 8.9–10.3)
CO2: 25 mmol/L (ref 22–32)
CREATININE: 0.87 mg/dL (ref 0.61–1.24)
Chloride: 102 mmol/L (ref 101–111)
GFR calc Af Amer: >60 mL/min (ref >60)
GFR calc non Af Amer: >60 mL/min (ref >60)
GLUCOSE: 104 mg/dL — AB (ref 65–99)
Potassium: 3.7 mmol/L (ref 3.5–5.1)
Sodium: 137 mmol/L (ref 135–145)
TOTAL PROTEIN: 7.1 g/dL (ref 6.5–8.1)

## 2016-07-06 LAB — CBC WITH DIFFERENTIAL/PLATELET
BASOS ABS: 0 10*3/uL (ref 0.0–0.1)
BASOS PCT: 0 %
EOS ABS: 0.2 10*3/uL (ref 0.0–0.7)
EOS PCT: 2 %
HCT: 42.5 % (ref 39.0–52.0)
Hemoglobin: 14.1 g/dL (ref 13.0–17.0)
Lymphocytes Relative: 30 %
Lymphs Abs: 2.9 10*3/uL (ref 0.7–4.0)
MCH: 28.5 pg (ref 26.0–34.0)
MCHC: 33.2 g/dL (ref 30.0–36.0)
MCV: 86 fL (ref 78.0–100.0)
MONO ABS: 1 10*3/uL (ref 0.1–1.0)
Monocytes Relative: 10 %
NEUTROS ABS: 5.7 10*3/uL (ref 1.7–7.7)
Neutrophils Relative %: 58 %
PLATELETS: 167 10*3/uL (ref 150–400)
RBC: 4.94 MIL/uL (ref 4.22–5.81)
RDW: 14.1 % (ref 11.5–15.5)
WBC: 9.7 10*3/uL (ref 4.0–10.5)

## 2016-07-06 LAB — I-STAT CG4 LACTIC ACID, ED: Lactic Acid, Venous: 0.63 mmol/L (ref 0.5–1.9)

## 2016-07-06 MED ORDER — IOPAMIDOL (ISOVUE-300) INJECTION 61%
75.0000 mL | Freq: Once | INTRAVENOUS | Status: AC | PRN
  Administered 2016-07-06: 75 mL via INTRAVENOUS

## 2016-07-06 MED ORDER — CLINDAMYCIN HCL 150 MG PO CAPS: 450.0000 mg | ORAL_CAPSULE | Freq: Three times a day (TID) | ORAL | 0 refills | Status: AC

## 2016-07-06 MED ORDER — CLINDAMYCIN HCL 150 MG PO CAPS
450.0000 mg | ORAL_CAPSULE | Freq: Once | ORAL | Status: AC
  Administered 2016-07-06: 450 mg via ORAL
  Filled 2016-07-06: qty 3

## 2016-07-06 NOTE — Discharge Instructions (Signed)
Please take the antibiotics to treat your infection around your Eye. If symptoms change or worsen, please return to the nearest emergency department. Please schedule a follow-up appointment with your PCP.

## 2016-07-06 NOTE — ED Triage Notes (Signed)
Pt to ED from home c/o left eye pain and swelling. Pt states he's had a pimple for years and he decided to pop it 2 days ago. When he woke up yesterday morning, pt states he noticed swelling that worsened throughout the day, despite ice. Pt reports hx glaucoma and this is his good eye. Pt denies blurred vision, just tightness around his eye and discomfort. Eye is half closed d/t swelling, redness noted around eye as well.

## 2016-07-06 NOTE — ED Provider Notes (Signed)
[facility] DEPT Provider Note   CSN: [id number] Arrival date & time: 07/06/16  0235     History   Chief Complaint Chief Complaint  Patient presents with  . Eye Pain    HPI Nathan Stafford is a 66 y.o. male.   The history is provided by the patient and medical records.  Eye Pain  This is a new problem. The current episode started more than 2 days ago. The problem occurs constantly. The problem has been gradually worsening. Pertinent negatives include no chest pain, no abdominal pain, no headaches and no shortness of breath. Nothing aggravates the symptoms. Nothing relieves the symptoms. He has tried nothing for the symptoms. The treatment provided no relief.      Past Medical History:  Diagnosis Date  . 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  Marland Kitchen GERD (gastroesophageal reflux disease)   . Glaucoma   . Hematuria   . HLD (hyperlipidemia)   . HTN (hypertension)   . Perinephric hematoma     Patient Active Problem List   Diagnosis Date Noted  . 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 Abnormal EKG 07/11/2011  . HTN (hypertension) 07/11/2011  . Tobacco abuse 07/11/2011  . Dyslipidemia 07/11/2011    Past Surgical History:  Procedure Laterality Date  . colonscopy    . EYE SURGERY Right 1993  . LASIK         Home Medications    Prior to Admission medications   Medication Sig Start Date End Date Taking? Authorizing Provider  amLODipine (NORVASC) 10 MG tablet Take 1 tablet (10 mg total) by mouth daily. 05/02/16   Jaynee Eagles, PA-C  aspirin 81 MG tablet Take 81 mg by mouth daily.    [provider]  brimonidine (ALPHAGAN) 0.2 % ophthalmic solution 1 drop 3 (three) times daily.    [provider]  dorzolamide-timolol (COSOPT) 22.3-6.8 MG/ML ophthalmic solution 1  drop 2 (two) times daily.    [provider]  hydrochlorothiazide (HYDRODIURIL) 25 MG tablet Take one tablet by mouth once daily. 05/02/16   Jaynee Eagles, PA-C  latanoprost (XALATAN) 0.005 % ophthalmic solution 1 drop at bedtime.    [provider]  metoprolol succinate (TOPROL-XL) 50 MG 24 hr tablet Take 1 tablet (50 mg total) by mouth daily. Take with or immediately following a meal. 05/02/16   Jaynee Eagles, PA-C  simvastatin (ZOCOR) 20 MG tablet Take 1 tablet (20 mg total) by mouth at bedtime. 05/02/16   Jaynee Eagles, PA-C    Family History Family History  Problem Relation Age of Onset  . Diabetes Father     Social History Social History  Substance Use Topics  . Smoking status: Current Every Day Smoker    Packs/day: 0.50    Years: 30.00    Types: Cigarettes  . Smokeless tobacco: Never Used  . Alcohol use 0.6 - 1.2 oz/week    1 - 2 Cans of beer per week     Comment: occ     Allergies   Ace inhibitors   Review of Systems Review of Systems  Constitutional: Negative for chills, diaphoresis, fatigue and fever.  HENT: Positive for facial swelling. Negative for congestion, rhinorrhea, tinnitus, trouble swallowing and voice change.   Eyes: Positive for pain. Negative for photophobia and visual disturbance.  Respiratory: Negative for choking, chest tightness, shortness of  breath, wheezing and stridor.   Cardiovascular: Negative for chest pain and palpitations.  Gastrointestinal: Negative for abdominal pain, constipation, diarrhea, nausea and vomiting.  Genitourinary: Negative for flank pain.  Musculoskeletal: Negative for back pain, neck pain and neck stiffness.  Neurological: Negative for light-headedness, numbness and headaches.  Psychiatric/Behavioral: Negative for agitation.  All other systems reviewed and are negative.    Physical Exam Updated Vital Signs BP (!) 143/81   Pulse 63   Temp 98.8 F (37.1 C) (Oral)   Resp 18   Ht 6' 2.5" (1.892 m)   Wt 196 lb  (88.9 kg)   SpO2 100%   BMI 24.83 kg/m   Physical Exam  Constitutional: He is oriented to person, place, and time. He appears well-developed and well-nourished.  HENT:  Head:    Right Ear: External ear normal.  Left Ear: External ear normal.  Nose: Nose normal.  Mouth/Throat: No oropharyngeal exudate.  Eyes: Conjunctivae and EOM are normal. Pupils are equal, round, and reactive to light. Right conjunctiva is not injected. Right conjunctiva has no hemorrhage. Left conjunctiva is not injected. Left conjunctiva has no hemorrhage. Right eye exhibits normal extraocular motion. Left eye exhibits normal extraocular motion. Right pupil is round and reactive. Left pupil is round and reactive. Pupils are equal.  Erythema, swelling, and induration surrounding the left eye. No drainage present.  Neck: Normal range of motion. Neck supple.  Cardiovascular: Normal rate, regular rhythm and intact distal pulses.   Pulmonary/Chest: Effort normal and breath sounds normal. No stridor. No respiratory distress. He has no wheezes. He exhibits no tenderness.  Abdominal: Soft. There is no tenderness.  Musculoskeletal: He exhibits no edema.  Neurological: He is alert and oriented to person, place, and time. No cranial nerve deficit or sensory deficit. He exhibits normal muscle tone.  Skin: Skin is warm and dry. Capillary refill takes less than 2 seconds. There is erythema. No pallor.  Psychiatric: He has a normal mood and affect.  Nursing note and vitals reviewed.    ED Treatments / Results  Labs (all labs ordered are listed, but only abnormal results are displayed) Labs Reviewed  COMPREHENSIVE METABOLIC PANEL - Abnormal; Notable for the following:       Result Value   Glucose, Bld 104 (*)    All other components within normal limits  CBC WITH DIFFERENTIAL/PLATELET  I-STAT CG4 LACTIC ACID, ED    EKG  EKG Interpretation None       Radiology Ct Maxillofacial W Contrast  Result Date:  07/06/2016 CLINICAL DATA:  Left facial swelling erythema. EXAM: CT MAXILLOFACIAL WITH CONTRAST TECHNIQUE: Multidetector CT imaging of the maxillofacial structures was performed. Multiplanar CT image reconstructions were also generated. A small metallic BB was placed on the right temple in order to reliably differentiate right from left. COMPARISON:  None. FINDINGS: Osseous: No fracture or mandibular dislocation. No destructive process. Orbits: Negative. No traumatic or inflammatory finding. Sinuses: Clear. Soft tissues: Edema and swelling of the subcutaneous tissues overlying the left maxillary and temporal regions is noted concerning for cellulitis or possibly contusion. No definite fluid collection or abscess is noted. Limited intracranial: No significant or unexpected finding. IMPRESSION: Edema and swelling of subcutaneous tissues overlying left maxillary and temporal regions is noted concerning for cellulitis or possibly contusion. No definite abscess is noted. No other abnormality seen in maxillofacial region. Electronically Signed   By: Marijo Conception, M.D.   On: 07/06/2016 09:00    Procedures Procedures (including critical care time)  Medications  Ordered in ED Medications  iopamidol (ISOVUE-300) 61 % injection 75 mL (75 mLs Intravenous Contrast Given 07/06/16 0807)  clindamycin (CLEOCIN) capsule 450 mg (450 mg Oral Given 07/06/16 1036)     Initial Impression / Assessment and Plan / ED Course  I have reviewed the triage vital signs and the nursing notes.  Pertinent labs & imaging results that were available during my care of the patient were reviewed by me and considered in my medical decision making (see chart for details).     Nathan Stafford is a 66 y.o. male with a past medical history significant for hypertension, hyperlipidemia, GERD, and glaucoma who presents with left facial redness, pain, and swelling concerning for infection. Patient says that he has had a "pimple" to the left of his eye  for many years. He says that over the last several days, he has developed swelling in pain around his left eye. He said his eye was nearly swollen shut yesterday. He reports that he tried to "pop" images continue to worsen. He says it is a mild pain at baseline and is very tender when you press on it. He denies any vision changes. He denies any fevers, chills, nausea, vomiting, neck pain. He denies any other symptoms. He does not have pain when he moves his eyes in all directions.  History and exam are seen above. On exam, patient has periorbital erythema, swelling, and tenderness surrounding the left eye. There is induration. No palpable fluctuance. Given the severe tenderness and fast spread of infection, patient will have imaging to look for orbital cellulitis.  CT scan revealed Peri-orbital cellulitis with no abscess. No orbital cellulitis. No evidence of pocket to drain.   Laboratory testing also reassuring. Patient given dose of clindamycin in the ED. Patient given persistent for clindamycin and given strict return precautions and PCP follow-up. Patient voiced understanding of the plan of care and was discharged in good condition.   Final Clinical Impressions(s) / ED Diagnoses   Final diagnoses:  Periorbital cellulitis of left eye    New Prescriptions Discharge Medication List as of 07/06/2016 10:35 AM    START taking these medications   Details  clindamycin (CLEOCIN) 150 MG capsule Take 3 capsules (450 mg total) by mouth 3 (three) times daily., Starting Sat 07/06/2016, Until Tue 07/16/2016, Print       Clinical Impression: 1. Periorbital cellulitis of left eye     Disposition: Discharge  Condition: Good  I have discussed the results, Dx and Tx plan with the pt(& family if present). He/she/they expressed understanding and agree(s) with the plan. Discharge instructions discussed at great length. Strict return precautions discussed and pt &/or family have verbalized understanding of  the instructions. No further questions at time of discharge.    Discharge Medication List as of 07/06/2016 10:35 AM    START taking these medications   Details  clindamycin (CLEOCIN) 150 MG capsule Take 3 capsules (450 mg total) by mouth 3 (three) times daily., Starting Sat 07/06/2016, Until Tue 07/16/2016, Print        Follow Up: Robyn Haber, [place] [phone]  Schedule an appointment as soon as possible for a visit    [facility] [address] [place] [address] [phone] Schedule an appointment as soon as possible for a visit    [facility] [address] [id number] [place] [phone]  If symptoms worsen  Brittni Hult, Gwenyth Allegra, MD 07/06/16 2013

## 2016-07-10 NOTE — Progress Notes (Signed)
Cardiology Office Note   Date:  07/11/2016   ID:  Nathan Stafford, DOB May 01, 1950, MRN [id number]  PCP:  Robyn Haber, MD  Cardiologist:   Minus Breeding, MD   Chief Complaint  Patient presents with  . Cardiomyopathy      History of Present Illness: Nathan Stafford is a 66 y.o. male who presents for follow up of NICM.  This was thought to be secondary to HTN. He had normal coronaries on cath in 2008.  The last EF in 2013 was 45%. I last saw him at that time.  He has done well. He was going for colonoscopy recently and they would not let him do this because he hasn't seen a cardiologist in many years.   He does some part-time work. He denies any cardiovascular symptoms. The patient denies any new symptoms such as chest discomfort, neck or arm discomfort. There has been no new shortness of breath, PND or orthopnea. There have been no reported palpitations, presyncope or syncope.  Past Medical History:  Diagnosis Date  . 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  Marland Kitchen GERD (gastroesophageal reflux disease)   . Glaucoma   . Hematuria   . HLD (hyperlipidemia)   . HTN (hypertension)   . Perinephric hematoma     Past Surgical History:  Procedure Laterality Date  . colonscopy    . EYE SURGERY Right 1993  . LASIK       Current Outpatient Prescriptions  Medication Sig Dispense Refill  . amLODipine (NORVASC) 10 MG tablet Take 1 tablet (10 mg total) by mouth daily. 90 tablet 3  . aspirin 81 MG tablet Take 81 mg by mouth daily.    . brimonidine (ALPHAGAN) 0.2 % ophthalmic solution Place 1 drop into both eyes 2 (two) times daily.     . clindamycin (CLEOCIN) 150 MG capsule Take 3 capsules (450 mg total) by mouth 3 (three) times daily. 90 capsule 0  . dorzolamide-timolol (COSOPT) 22.3-6.8 MG/ML ophthalmic solution  Place 1 drop into both eyes 2 (two) times daily.     . hydrochlorothiazide (HYDRODIURIL) 25 MG tablet Take one tablet by mouth once daily. 90 tablet 3  . latanoprost (XALATAN) 0.005 % ophthalmic solution Place 1 drop into both eyes at bedtime.     . metoprolol succinate (TOPROL-XL) 50 MG 24 hr tablet Take 1 tablet (50 mg total) by mouth daily. Take with or immediately following a meal. 90 tablet 3  . simvastatin (ZOCOR) 20 MG tablet Take 1 tablet (20 mg total) by mouth at bedtime. 90 tablet 3  . sildenafil (VIAGRA) 50 MG tablet Take 1 tablet (50 mg total) by mouth daily as needed for erectile dysfunction. 30 tablet 1   No current facility-administered medications for this visit.     Allergies:   Ace inhibitors    Social History:  The patient  reports that he has been smoking Cigarettes.  He has a 15.00 pack-year smoking history. He has never used smokeless tobacco. He reports that he drinks about 0.6 - 1.2 oz of alcohol per week . He reports that he does not use drugs.   Family History:  The patient's family history includes Diabetes in his father.    Was not raised by his mom and doesn't know history.  He is the oldest of many siblings but doesn't know  how many.     ROS:  Please see the history of present illness.   Otherwise, review of systems are positive for ED.   All other systems are reviewed and negative.    PHYSICAL EXAM: VS:  BP 138/71 (BP Location: Right Arm, Patient Position: Sitting, Cuff Size: Normal)   Pulse 77   Ht 6\' 2"  (1.88 m)   Wt 196 lb 4 oz (89 kg)   BMI 25.20 kg/m  , BMI Body mass index is 25.2 kg/m. GENERAL:  Well appearing HEENT:  Pupils equal round and reactive, fundi not visualized, oral mucosa unremarkable, poor dentition NECK:  No jugular venous distention, waveform within normal limits, carotid upstroke brisk and symmetric, no bruits, no thyromegaly LYMPHATICS:  No cervical, inguinal adenopathy LUNGS:  Clear to auscultation bilaterally BACK:  No CVA  tenderness CHEST:  Unremarkable HEART:  PMI not displaced or sustained,S1 and S2 within normal limits, no S3, no S4, no clicks, no rubs, no murmurs ABD:  Flat, positive bowel sounds normal in frequency in pitch, no bruits, no rebound, no guarding, no midline pulsatile mass, no hepatomegaly, no splenomegaly EXT:  2 plus pulses throughout, trace ankle edema, no cyanosis no clubbing SKIN:  No rashes no nodules NEURO:  Cranial nerves II through XII grossly intact, motor grossly intact throughout PSYCH:  Cognitively intact, oriented to person place and time    EKG:  EKG is ordered today. The ekg ordered today demonstrates sinus rhythm, right bundle branch block, left anterior fascicular block, left axis deviation, left ventricular hypertrophy, no change from previous.  Recent Labs: 07/06/2016: ALT 20; BUN 10; Creatinine, Ser 0.87; Hemoglobin 14.1; Platelets 167; Potassium 3.7; Sodium 137    Lipid Panel    Component Value Date/Time   CHOL 158 05/02/2016 1001   TRIG 103 05/02/2016 1001   HDL 38 (L) 05/02/2016 1001   CHOLHDL 4.2 05/02/2016 1001   CHOLHDL 4.2 04/26/2015 1440   VLDL 24 04/26/2015 1440   LDLCALC 99 05/02/2016 1001      Wt Readings from Last 3 Encounters:  07/11/16 196 lb 4 oz (89 kg)  07/06/16 196 lb (88.9 kg)  05/02/16 193 lb (87.5 kg)      Other studies Reviewed: Additional studies/ records that were reviewed today include: None. Review of the above records demonstrates:  Please see elsewhere in the note.     ASSESSMENT AND PLAN:   NON ISCHEMIC CARDIOMYOPATHY:  He doesn't have any symptoms at this point. It has been many years and so I will check an echocardiogram.  HTN:  The blood pressure is at target. No change in medications is indicated. We will continue with therapeutic lifestyle changes (TLC).  ED:  I will give a limited prescription for Viagra.   RBBB:  No change in therapy.  This has been chronic.    Current medicines are reviewed at length with  the patient today.  The patient does not have concerns regarding medicines.  The following changes have been made:  no change  Labs/ tests ordered today include:   Orders Placed This Encounter  Procedures  . ECHOCARDIOGRAM COMPLETE     Disposition:   FU with me in two years.      Signed, Minus Breeding, MD  07/11/2016 6:43 PM    [facility]

## 2016-07-11 ENCOUNTER — Ambulatory Visit (INDEPENDENT_AMBULATORY_CARE_PROVIDER_SITE_OTHER): Payer: Medicare HMO | Admitting: Cardiology

## 2016-07-11 ENCOUNTER — Encounter: Payer: Self-pay | Admitting: Cardiology

## 2016-07-11 VITALS — BP 138/71 | HR 77 | Ht 74.0 in | Wt 196.2 lb

## 2016-07-11 DIAGNOSIS — I429 Cardiomyopathy, unspecified: Secondary | ICD-10-CM | POA: Diagnosis not present

## 2016-07-11 DIAGNOSIS — I10 Essential (primary) hypertension: Secondary | ICD-10-CM

## 2016-07-11 DIAGNOSIS — I4510 Unspecified right bundle-branch block: Secondary | ICD-10-CM | POA: Diagnosis not present

## 2016-07-11 DIAGNOSIS — R140 Abdominal distension (gaseous): Secondary | ICD-10-CM

## 2016-07-11 DIAGNOSIS — N529 Male erectile dysfunction, unspecified: Secondary | ICD-10-CM

## 2016-07-11 MED ORDER — SILDENAFIL CITRATE 50 MG PO TABS: 50.0000 mg | ORAL_TABLET | Freq: Every day | ORAL | 1 refills | Status: DC | PRN

## 2016-07-11 NOTE — Patient Instructions (Signed)
Medication Instructions:  Continue current medications  Labwork: None Ordered  Testing/Procedures: Your physician has requested that you have an echocardiogram. Echocardiography is a painless test that uses sound waves to create images of your heart. It provides your doctor with information about the size and shape of your heart and how well your heart's chambers and valves are working. This procedure takes approximately one hour. There are no restrictions for this procedure.  Follow-Up: Your physician wants you to follow-up in: 2 Years. You will receive a reminder letter in the mail two months in advance. If you don't receive a letter, please call our office to schedule the follow-up appointment.   Any Other Special Instructions Will Be Listed Below (If Applicable).   If you need a refill on your cardiac medications before your next appointment, please call your pharmacy.

## 2016-07-15 ENCOUNTER — Telehealth: Payer: Self-pay | Admitting: *Deleted

## 2016-07-15 NOTE — Telephone Encounter (Signed)
The patient is OK for the planned procedure.  He can hold the ASA.  Not sure why he needs to though?

## 2016-07-15 NOTE — Telephone Encounter (Signed)
Reviewed chart, pending echocardiogram 07/26/16.

## 2016-07-15 NOTE — Telephone Encounter (Signed)
Request for surgical clearance:  1. What type of surgery is being performed? colonoscopy  2. When is this surgery scheduled? 09/20/2016  3. Are there any medications that need to be held prior to surgery and how long? ASA  4. Name of physician performing surgery? Dr. Collene Mares  5. What is your office phone and fax number?   Phone: [phone] Fax: [phone]

## 2016-07-16 NOTE — Telephone Encounter (Signed)
Clearance faxed via Epic.  

## 2016-07-24 DIAGNOSIS — H5213 Myopia, bilateral: Secondary | ICD-10-CM | POA: Diagnosis not present

## 2016-07-26 ENCOUNTER — Ambulatory Visit (HOSPITAL_COMMUNITY): Payer: Medicare HMO | Attending: Cardiology

## 2016-07-26 ENCOUNTER — Other Ambulatory Visit: Payer: Self-pay

## 2016-07-26 DIAGNOSIS — I10 Essential (primary) hypertension: Secondary | ICD-10-CM | POA: Insufficient documentation

## 2016-07-26 DIAGNOSIS — I429 Cardiomyopathy, unspecified: Secondary | ICD-10-CM

## 2016-07-26 DIAGNOSIS — I081 Rheumatic disorders of both mitral and tricuspid valves: Secondary | ICD-10-CM | POA: Diagnosis not present

## 2016-07-26 DIAGNOSIS — I371 Nonrheumatic pulmonary valve insufficiency: Secondary | ICD-10-CM | POA: Insufficient documentation

## 2016-07-26 DIAGNOSIS — Z720 Tobacco use: Secondary | ICD-10-CM | POA: Insufficient documentation

## 2016-07-26 DIAGNOSIS — E785 Hyperlipidemia, unspecified: Secondary | ICD-10-CM | POA: Diagnosis not present

## 2016-08-12 DIAGNOSIS — Z79899 Other long term (current) drug therapy: Secondary | ICD-10-CM | POA: Diagnosis not present

## 2016-08-12 DIAGNOSIS — Z7982 Long term (current) use of aspirin: Secondary | ICD-10-CM | POA: Diagnosis not present

## 2016-08-12 DIAGNOSIS — E7800 Pure hypercholesterolemia, unspecified: Secondary | ICD-10-CM | POA: Diagnosis not present

## 2016-08-12 DIAGNOSIS — I10 Essential (primary) hypertension: Secondary | ICD-10-CM | POA: Diagnosis not present

## 2016-08-12 DIAGNOSIS — Z0000 Encounter for general adult medical examination without abnormal findings: Secondary | ICD-10-CM | POA: Diagnosis not present

## 2016-08-12 DIAGNOSIS — Z6826 Body mass index (BMI) 26.0-26.9, adult: Secondary | ICD-10-CM | POA: Diagnosis not present

## 2016-08-12 DIAGNOSIS — N529 Male erectile dysfunction, unspecified: Secondary | ICD-10-CM | POA: Diagnosis not present

## 2016-09-20 DIAGNOSIS — Z1211 Encounter for screening for malignant neoplasm of colon: Secondary | ICD-10-CM | POA: Diagnosis not present

## 2016-09-20 DIAGNOSIS — K635 Polyp of colon: Secondary | ICD-10-CM | POA: Diagnosis not present

## 2016-09-20 DIAGNOSIS — D120 Benign neoplasm of cecum: Secondary | ICD-10-CM | POA: Diagnosis not present

## 2016-09-20 DIAGNOSIS — D123 Benign neoplasm of transverse colon: Secondary | ICD-10-CM | POA: Diagnosis not present

## 2016-09-20 DIAGNOSIS — K5140 Inflammatory polyps of colon without complications: Secondary | ICD-10-CM | POA: Diagnosis not present

## 2016-09-20 DIAGNOSIS — D125 Benign neoplasm of sigmoid colon: Secondary | ICD-10-CM | POA: Diagnosis not present

## 2016-09-20 LAB — HM COLONOSCOPY

## 2016-10-09 DIAGNOSIS — H401113 Primary open-angle glaucoma, right eye, severe stage: Secondary | ICD-10-CM | POA: Diagnosis not present

## 2017-03-02 ENCOUNTER — Other Ambulatory Visit: Payer: Self-pay | Admitting: Urgent Care

## 2017-03-02 DIAGNOSIS — I10 Essential (primary) hypertension: Secondary | ICD-10-CM

## 2017-04-25 ENCOUNTER — Encounter: Payer: Medicare HMO | Admitting: Urgent Care

## 2017-05-06 ENCOUNTER — Ambulatory Visit: Payer: Medicare HMO

## 2017-05-09 ENCOUNTER — Encounter: Payer: Self-pay | Admitting: Physician Assistant

## 2017-05-09 ENCOUNTER — Encounter: Payer: Medicare HMO | Admitting: Urgent Care

## 2017-05-09 ENCOUNTER — Ambulatory Visit (INDEPENDENT_AMBULATORY_CARE_PROVIDER_SITE_OTHER): Payer: Medicare HMO | Admitting: Physician Assistant

## 2017-05-09 ENCOUNTER — Other Ambulatory Visit: Payer: Self-pay

## 2017-05-09 VITALS — BP 128/70 | HR 85 | Temp 98.4°F | Resp 18 | Ht 74.0 in | Wt 197.6 lb

## 2017-05-09 DIAGNOSIS — E785 Hyperlipidemia, unspecified: Secondary | ICD-10-CM | POA: Diagnosis not present

## 2017-05-09 DIAGNOSIS — E782 Mixed hyperlipidemia: Secondary | ICD-10-CM

## 2017-05-09 DIAGNOSIS — I10 Essential (primary) hypertension: Secondary | ICD-10-CM

## 2017-05-09 DIAGNOSIS — Z1159 Encounter for screening for other viral diseases: Secondary | ICD-10-CM | POA: Diagnosis not present

## 2017-05-09 MED ORDER — SIMVASTATIN 20 MG PO TABS: 20.0000 mg | ORAL_TABLET | Freq: Every day | ORAL | 3 refills | Status: DC

## 2017-05-09 MED ORDER — METOPROLOL SUCCINATE ER 50 MG PO TB24: 50.0000 mg | ORAL_TABLET | Freq: Every day | ORAL | 3 refills | Status: DC

## 2017-05-09 MED ORDER — ASPIRIN 81 MG PO TABS: 81.0000 mg | ORAL_TABLET | Freq: Every day | ORAL | 3 refills | Status: DC

## 2017-05-09 MED ORDER — AMLODIPINE BESYLATE 10 MG PO TABS: 10.0000 mg | ORAL_TABLET | Freq: Every day | ORAL | 0 refills | Status: DC

## 2017-05-09 MED ORDER — HYDROCHLOROTHIAZIDE 25 MG PO TABS: ORAL_TABLET | ORAL | 3 refills | Status: DC

## 2017-05-09 NOTE — Patient Instructions (Addendum)
  Please follow up with your heart doctor. Your medications have been sent to the pharmacy for a year. Please follow up with Philis Fendt, PA or Rosario Adie, PA in 3 months.   IF you received an x-ray today, you will receive an invoice from [facility]. Please contact [facility] at [phone] with questions or concerns regarding your invoice.   IF you received labwork today, you will receive an invoice from [facility]. Please contact [facility] at [phone] with questions or concerns regarding your invoice.   Our billing staff will not be able to assist you with questions regarding bills from these companies.  You will be contacted with the lab results as soon as they are available. The fastest way to get your results is to activate your My Chart account. Instructions are located on the last page of this paperwork. If you have not heard from [place] regarding the results in 2 weeks, please contact this office.

## 2017-05-09 NOTE — Progress Notes (Signed)
05/09/2017 8:26 AM   DOB: November 01, 1950 / MRN: [id number]  SUBJECTIVE:  Nathan Stafford is a 67 y.o. male presenting for chronic medication refills.  He feels well today and has no complaint.  Sees Rosario Adie most often here in the office.  Is taking multiple medications for blood pressure control along with simvastatin.  Receives eyedrops from his eye doctor.  Does have a history of cardiomyopathy.  Is taking metoprolol for this.  He is allergic to ace inhibitors.   He  has a past medical history of Cardiomyopathy secondary, GERD (gastroesophageal reflux disease), Glaucoma, Hematuria, HLD (hyperlipidemia), HTN (hypertension), and Perinephric hematoma.    He  reports that he has been smoking cigarettes.  He has a 15.00 pack-year smoking history. he has never used smokeless tobacco. He reports that he drinks about 0.6 - 1.2 oz of alcohol per week. He reports that he does not use drugs. He  reports that he currently engages in sexual activity. The patient  has a past surgical history that includes colonscopy; LASIK; and Eye surgery (Right, 1993).  His family history includes Diabetes in his father.  Review of Systems  Constitutional: Negative for chills, diaphoresis and fever.  Eyes: Negative.   Respiratory: Negative for cough, hemoptysis, sputum production, shortness of breath and wheezing.   Cardiovascular: Negative for chest pain, orthopnea and leg swelling.  Gastrointestinal: Negative for nausea.  Skin: Negative for rash.  Neurological: Negative for dizziness, sensory change, speech change, focal weakness and headaches.    The problem list and medications were reviewed and updated by myself where necessary and exist elsewhere in the encounter.   OBJECTIVE:  BP 128/70 (BP Location: Left Arm, Patient Position: Sitting, Cuff Size: Large)   Pulse 85   Temp 98.4 F (36.9 C) (Oral)   Resp 18   Ht 6\' 2"  (1.88 m)   Wt 197 lb 9.6 oz (89.6 kg)   SpO2 100%   BMI 25.37 kg/m   Physical Exam    Constitutional: He appears well-developed. He is active and cooperative.  Non-toxic appearance.  Cardiovascular: Normal rate, regular rhythm, S1 normal, S2 normal, normal heart sounds, intact distal pulses and normal pulses. Exam reveals no gallop and no friction rub.  No murmur heard. Pulmonary/Chest: Effort normal. No stridor. No tachypnea. No respiratory distress. He has no wheezes. He has no rales.  Abdominal: He exhibits no distension.  Musculoskeletal: He exhibits no edema.  Neurological: He is alert.  Skin: Skin is warm and dry. He is not diaphoretic. No pallor.  Vitals reviewed.   Lab Results  Component Value Date   WBC 9.7 07/06/2016   HGB 14.1 07/06/2016   HCT 42.5 07/06/2016   MCV 86.0 07/06/2016   PLT 167 07/06/2016    Lab Results  Component Value Date   CREATININE 0.87 07/06/2016   BUN 10 07/06/2016   NA 137 07/06/2016   K 3.7 07/06/2016   CL 102 07/06/2016   CO2 25 07/06/2016    Lab Results  Component Value Date   ALT 20 07/06/2016   AST 22 07/06/2016   ALKPHOS 74 07/06/2016   BILITOT 0.6 07/06/2016    Lab Results  Component Value Date   TSH 2.31 04/26/2015    Lab Results  Component Value Date   CHOL 158 05/02/2016   HDL 38 (L) 05/02/2016   LDLCALC 99 05/02/2016   TRIG 103 05/02/2016   CHOLHDL 4.2 05/02/2016     No results found for this or any previous visit (  from the past 72 hour(s)).  No results found.  ASSESSMENT AND PLAN:  Nathan Stafford was seen today for annual exam.  Diagnoses and all orders for this visit:  Hypertension, unspecified type -     Comprehensive metabolic panel -     CBC -     TSH  Need for hepatitis C screening test -     Hepatitis C antibody  Dyslipidemia -     Lipid panel  Essential hypertension -     amLODipine (NORVASC) 10 MG tablet; Take 1 tablet (10 mg total) by mouth daily. Office visit needed for refills -     hydrochlorothiazide (HYDRODIURIL) 25 MG tablet; Take one tablet by mouth once daily. -      metoprolol succinate (TOPROL-XL) 50 MG 24 hr tablet; Take 1 tablet (50 mg total) by mouth daily. Take with or immediately following a meal. -     aspirin 81 MG tablet; Take 1 tablet (81 mg total) by mouth daily.  Mixed hyperlipidemia -     simvastatin (ZOCOR) 20 MG tablet; Take 1 tablet (20 mg total) by mouth at bedtime.    The patient is advised to call or return to clinic if he does not see an improvement in symptoms, or to seek the care of the closest emergency department if he worsens with the above plan.   Philis Fendt, MHS, PA-C [facility] 05/09/2017 8:26 AM

## 2017-05-10 LAB — COMPREHENSIVE METABOLIC PANEL
ALBUMIN: 4.6 g/dL (ref 3.6–4.8)
ALK PHOS: 77 IU/L (ref 39–117)
ALT: 58 IU/L — ABNORMAL HIGH (ref 0–44)
AST: 60 IU/L — ABNORMAL HIGH (ref 0–40)
Albumin/Globulin Ratio: 1.6 (ref 1.2–2.2)
BUN/Creatinine Ratio: 10 (ref 10–24)
BUN: 8 mg/dL (ref 8–27)
Bilirubin Total: 0.5 mg/dL (ref 0.0–1.2)
CALCIUM: 9.9 mg/dL (ref 8.6–10.2)
CHLORIDE: 101 mmol/L (ref 96–106)
CO2: 25 mmol/L (ref 20–29)
Creatinine, Ser: 0.8 mg/dL (ref 0.76–1.27)
GFR calc non Af Amer: 92 mL/min/{1.73_m2} (ref >59)
GFR, EST AFRICAN AMERICAN: 107 mL/min/{1.73_m2} (ref >59)
GLUCOSE: 108 mg/dL — AB (ref 65–99)
Globulin, Total: 2.8 g/dL (ref 1.5–4.5)
POTASSIUM: 3.5 mmol/L (ref 3.5–5.2)
SODIUM: 140 mmol/L (ref 134–144)
Total Protein: 7.4 g/dL (ref 6.0–8.5)

## 2017-05-10 LAB — LIPID PANEL
CHOL/HDL RATIO: 3.6 ratio (ref 0.0–5.0)
Cholesterol, Total: 167 mg/dL (ref 100–199)
HDL: 46 mg/dL (ref >39)
LDL Calculated: 101 mg/dL — ABNORMAL HIGH (ref 0–99)
Triglycerides: 99 mg/dL (ref 0–149)
VLDL Cholesterol Cal: 20 mg/dL (ref 5–40)

## 2017-05-10 LAB — CBC
HEMATOCRIT: 44.5 % (ref 37.5–51.0)
HEMOGLOBIN: 14.9 g/dL (ref 13.0–17.7)
MCH: 29 pg (ref 26.6–33.0)
MCHC: 33.5 g/dL (ref 31.5–35.7)
MCV: 87 fL (ref 79–97)
Platelets: 173 10*3/uL (ref 150–379)
RBC: 5.14 x10E6/uL (ref 4.14–5.80)
RDW: 15.7 % — ABNORMAL HIGH (ref 12.3–15.4)
WBC: 7 10*3/uL (ref 3.4–10.8)

## 2017-05-10 LAB — HEPATITIS C ANTIBODY

## 2017-05-10 LAB — TSH: TSH: 1.25 u[IU]/mL (ref 0.450–4.500)

## 2017-05-15 ENCOUNTER — Encounter: Payer: Self-pay | Admitting: Physician Assistant

## 2017-05-15 ENCOUNTER — Other Ambulatory Visit: Payer: Self-pay | Admitting: Physician Assistant

## 2017-05-15 DIAGNOSIS — R7401 Elevation of levels of liver transaminase levels: Secondary | ICD-10-CM

## 2017-05-15 DIAGNOSIS — R740 Nonspecific elevation of levels of transaminase and lactic acid dehydrogenase [LDH]: Principal | ICD-10-CM

## 2017-05-22 ENCOUNTER — Telehealth: Payer: Self-pay | Admitting: Urgent Care

## 2017-05-22 NOTE — Telephone Encounter (Signed)
Copied from [place]. Topic: Quick Communication - See Telephone Encounter >> May 22, 2017  3:38 PM Ivar Drape wrote: CRM for notification. See Telephone encounter for: 05/22/17. Patient would like the results of the labs he took on 05/09/17

## 2017-05-23 NOTE — Telephone Encounter (Signed)
Phone call to patient. He states that he received letter in the mail, is aware of message from Legrand [place]. Reviewed message with patient, he verbalizes understanding of needing to come back in two months.   He has further questions about what he will be charged for lab only visit, will route call to billing to review and advise.

## 2017-06-03 DIAGNOSIS — R609 Edema, unspecified: Secondary | ICD-10-CM | POA: Diagnosis not present

## 2017-06-03 DIAGNOSIS — I10 Essential (primary) hypertension: Secondary | ICD-10-CM | POA: Diagnosis not present

## 2017-06-03 DIAGNOSIS — R69 Illness, unspecified: Secondary | ICD-10-CM | POA: Diagnosis not present

## 2017-06-03 DIAGNOSIS — H409 Unspecified glaucoma: Secondary | ICD-10-CM | POA: Diagnosis not present

## 2017-06-03 DIAGNOSIS — Z7982 Long term (current) use of aspirin: Secondary | ICD-10-CM | POA: Diagnosis not present

## 2017-06-03 DIAGNOSIS — E785 Hyperlipidemia, unspecified: Secondary | ICD-10-CM | POA: Diagnosis not present

## 2017-06-03 DIAGNOSIS — Z888 Allergy status to other drugs, medicaments and biological substances status: Secondary | ICD-10-CM | POA: Diagnosis not present

## 2017-06-03 DIAGNOSIS — Z833 Family history of diabetes mellitus: Secondary | ICD-10-CM | POA: Diagnosis not present

## 2017-06-13 DIAGNOSIS — H401113 Primary open-angle glaucoma, right eye, severe stage: Secondary | ICD-10-CM | POA: Diagnosis not present

## 2017-07-24 ENCOUNTER — Telehealth: Payer: Self-pay | Admitting: Urgent Care

## 2017-07-24 NOTE — Telephone Encounter (Signed)
Called and left VM for pt to call the office back. We will need to reschedule his appt with Jaynee Eagles on 08/12/17 for a 3 month F/U. Bess Harvest is unavailable this day.   Thanks!

## 2017-08-12 ENCOUNTER — Ambulatory Visit: Payer: Medicare HMO | Admitting: Urgent Care

## 2017-08-14 ENCOUNTER — Encounter: Payer: Self-pay | Admitting: Urgent Care

## 2017-08-14 ENCOUNTER — Ambulatory Visit (INDEPENDENT_AMBULATORY_CARE_PROVIDER_SITE_OTHER): Payer: Medicare HMO | Admitting: Urgent Care

## 2017-08-14 VITALS — BP 164/90 | HR 80 | Temp 97.6°F | Resp 16 | Ht 74.0 in | Wt 201.0 lb

## 2017-08-14 DIAGNOSIS — R7989 Other specified abnormal findings of blood chemistry: Secondary | ICD-10-CM

## 2017-08-14 DIAGNOSIS — R030 Elevated blood-pressure reading, without diagnosis of hypertension: Secondary | ICD-10-CM | POA: Diagnosis not present

## 2017-08-14 DIAGNOSIS — R945 Abnormal results of liver function studies: Secondary | ICD-10-CM | POA: Diagnosis not present

## 2017-08-14 DIAGNOSIS — F1090 Alcohol use, unspecified, uncomplicated: Secondary | ICD-10-CM

## 2017-08-14 DIAGNOSIS — I10 Essential (primary) hypertension: Secondary | ICD-10-CM

## 2017-08-14 DIAGNOSIS — Z7289 Other problems related to lifestyle: Secondary | ICD-10-CM

## 2017-08-14 DIAGNOSIS — Z789 Other specified health status: Secondary | ICD-10-CM

## 2017-08-14 DIAGNOSIS — R739 Hyperglycemia, unspecified: Secondary | ICD-10-CM | POA: Diagnosis not present

## 2017-08-14 LAB — HEMOGLOBIN A1C
Est. average glucose Bld gHb Est-mCnc: 120 mg/dL
Hgb A1c MFr Bld: 5.8 % — ABNORMAL HIGH (ref 4.8–5.6)

## 2017-08-14 NOTE — Progress Notes (Signed)
    MRN: [id number] DOB: [birth year]  Subjective:   Nathan Stafford is a 67 y.o. male presenting for follow up on recheck on lfts. Reports 1 month history of intermittent mild, lower abdominal discomfort that is relieved with belching.  States specifically that it is not pain but an uncomfortable bloating sensation and is relieved with belching.  Denies fever, chest pain, n/v, constipation, bloody stools. He does drink ~6 beers per week.  Had a colonoscopy last year, had ~3 polyps removed, has f/u in 3 years. Smokes cigarettes on occasion, is trying to quit.   Nathan Stafford has a current medication list which includes the following prescription(s): amlodipine, aspirin, brimonidine, dorzolamide-timolol, hydrochlorothiazide, latanoprost, metoprolol succinate, sildenafil, and simvastatin. Also is allergic to ace inhibitors.  Nathan Stafford  has a past medical history of Cardiomyopathy secondary, GERD (gastroesophageal reflux disease), Glaucoma, Hematuria, HLD (hyperlipidemia), HTN (hypertension), and Perinephric hematoma. Also  has a past surgical history that includes colonscopy; LASIK; and Eye surgery (Right, 1993).  Objective:   Vitals: BP (!) 164/90   Pulse 80   Temp 97.6 F (36.4 C) (Oral)   Resp 16   Ht 6\' 2"  (1.88 m)   Wt 201 lb (91.2 kg)   SpO2 99%   BMI 25.81 kg/m   BP Readings from Last 3 Encounters:  08/14/17 (!) 164/90  05/09/17 128/70  07/11/16 138/71    Physical Exam  Constitutional: He is oriented to person, place, and time. He appears well-developed and well-nourished.  HENT:  Mouth/Throat: Oropharynx is clear and moist.  Eyes: Right eye exhibits no discharge. Left eye exhibits no discharge. No scleral icterus.  Cardiovascular: Normal rate, regular rhythm and intact distal pulses. Exam reveals no gallop and no friction rub.  No murmur heard. Pulmonary/Chest: No respiratory distress. He has no wheezes. He has no rales.  Neurological: He is alert and oriented to person, place, and time.    Skin: Skin is warm and dry.  Psychiatric: He has a normal mood and affect.   Assessment and Plan :   Elevated LFTs - Plan: Hepatitis B surface antigen, Comprehensive metabolic panel  Essential hypertension  Elevated blood pressure reading  Alcohol use  Elevated blood sugar - Plan: Hemoglobin A1c  We will repeat labs today.  Patient states that he has been noncompliant with his diet regarding salt in his blood pressure.  He is taking his medications.  Counseled on lifestyle modifications including no longer drinking alcohol and avoidance of salt.  Labs pending.  Follow-up in 4 weeks for recheck on his blood pressure.  Patient is to monitor his blood pressure at home.  Jaynee Eagles, PA-C [facility] [phone] 08/14/2017 9:28 AM

## 2017-08-14 NOTE — Patient Instructions (Addendum)
Please check your blood pressure once weekly. Try to check the blood pressure around the same time each day that you check it after a period of resting in a seated position for 5-10 minutes. The readings should be between 427-062 systolic (top number), between 37-62 diastolic (bottom number). Avoid salt/sodium in your diet including processed meats, frozen meals, restaurants foods.     Hypertension Hypertension, commonly called high blood pressure, is when the force of blood pumping through the arteries is too strong. The arteries are the blood vessels that carry blood from the heart throughout the body. Hypertension forces the heart to work harder to pump blood and may cause arteries to become narrow or stiff. Having untreated or uncontrolled hypertension can cause heart attacks, strokes, kidney disease, and other problems. A blood pressure reading consists of a higher number over a lower number. Ideally, your blood pressure should be below 120/80. The first ("top") number is called the systolic pressure. It is a measure of the pressure in your arteries as your heart beats. The second ("bottom") number is called the diastolic pressure. It is a measure of the pressure in your arteries as the heart relaxes. What are the causes? The cause of this condition is not known. What increases the risk? Some risk factors for high blood pressure are under your control. Others are not. Factors you can change  Smoking.  Having type 2 diabetes mellitus, high cholesterol, or both.  Not getting enough exercise or physical activity.  Being overweight.  Having too much fat, sugar, calories, or salt (sodium) in your diet.  Drinking too much alcohol. Factors that are difficult or impossible to change  Having chronic kidney disease.  Having a family history of high blood pressure.  Age. Risk increases with age.  Race. You may be at higher risk if you are African-American.  Gender. Men are at higher risk  than women before age 32. After age 38, women are at higher risk than men.  Having obstructive sleep apnea.  Stress. What are the signs or symptoms? Extremely high blood pressure (hypertensive crisis) may cause:  Headache.  Anxiety.  Shortness of breath.  Nosebleed.  Nausea and vomiting.  Severe chest pain.  Jerky movements you cannot control (seizures).  How is this diagnosed? This condition is diagnosed by measuring your blood pressure while you are seated, with your arm resting on a surface. The cuff of the blood pressure monitor will be placed directly against the skin of your upper arm at the level of your heart. It should be measured at least twice using the same arm. Certain conditions can cause a difference in blood pressure between your right and left arms. Certain factors can cause blood pressure readings to be lower or higher than normal (elevated) for a short period of time:  When your blood pressure is higher when you are in a health care provider's office than when you are at home, this is called white coat hypertension. Most people with this condition do not need medicines.  When your blood pressure is higher at home than when you are in a health care provider's office, this is called masked hypertension. Most people with this condition may need medicines to control blood pressure.  If you have a high blood pressure reading during one visit or you have normal blood pressure with other risk factors:  You may be asked to return on a different day to have your blood pressure checked again.  You may be asked to  monitor your blood pressure at home for 1 week or longer.  If you are diagnosed with hypertension, you may have other blood or imaging tests to help your health care provider understand your overall risk for other conditions. How is this treated? This condition is treated by making healthy lifestyle changes, such as eating healthy foods, exercising more, and  reducing your alcohol intake. Your health care provider may prescribe medicine if lifestyle changes are not enough to get your blood pressure under control, and if:  Your systolic blood pressure is above 130.  Your diastolic blood pressure is above 80.  Your personal target blood pressure may vary depending on your medical conditions, your age, and other factors. Follow these instructions at home: Eating and drinking  Eat a diet that is high in fiber and potassium, and low in sodium, added sugar, and fat. An example eating plan is called the DASH (Dietary Approaches to Stop Hypertension) diet. To eat this way: ? Eat plenty of fresh fruits and vegetables. Try to fill half of your plate at each meal with fruits and vegetables. ? Eat whole grains, such as whole wheat pasta, brown rice, or whole grain bread. Fill about one quarter of your plate with whole grains. ? Eat or drink low-fat dairy products, such as skim milk or low-fat yogurt. ? Avoid fatty cuts of meat, processed or cured meats, and poultry with skin. Fill about one quarter of your plate with lean proteins, such as fish, chicken without skin, beans, eggs, and tofu. ? Avoid premade and processed foods. These tend to be higher in sodium, added sugar, and fat.  Reduce your daily sodium intake. Most people with hypertension should eat less than 1,500 mg of sodium a day.  Limit alcohol intake to no more than 1 drink a day for nonpregnant women and 2 drinks a day for men. One drink equals 12 oz of beer, 5 oz of wine, or 1 oz of hard liquor. Lifestyle  Work with your health care provider to maintain a healthy body weight or to lose weight. Ask what an ideal weight is for you.  Get at least 30 minutes of exercise that causes your heart to beat faster (aerobic exercise) most days of the week. Activities may include walking, swimming, or biking.  Include exercise to strengthen your muscles (resistance exercise), such as pilates or lifting  weights, as part of your weekly exercise routine. Try to do these types of exercises for 30 minutes at least 3 days a week.  Do not use any products that contain nicotine or tobacco, such as cigarettes and e-cigarettes. If you need help quitting, ask your health care provider.  Monitor your blood pressure at home as told by your health care provider.  Keep all follow-up visits as told by your health care provider. This is important. Medicines  Take over-the-counter and prescription medicines only as told by your health care provider. Follow directions carefully. Blood pressure medicines must be taken as prescribed.  Do not skip doses of blood pressure medicine. Doing this puts you at risk for problems and can make the medicine less effective.  Ask your health care provider about side effects or reactions to medicines that you should watch for. Contact a health care provider if:  You think you are having a reaction to a medicine you are taking.  You have headaches that keep coming back (recurring).  You feel dizzy.  You have swelling in your ankles.  You have trouble with your  vision. Get help right away if:  You develop a severe headache or confusion.  You have unusual weakness or numbness.  You feel faint.  You have severe pain in your chest or abdomen.  You vomit repeatedly.  You have trouble breathing. Summary  Hypertension is when the force of blood pumping through your arteries is too strong. If this condition is not controlled, it may put you at risk for serious complications.  Your personal target blood pressure may vary depending on your medical conditions, your age, and other factors. For most people, a normal blood pressure is less than 120/80.  Hypertension is treated with lifestyle changes, medicines, or a combination of both. Lifestyle changes include weight loss, eating a healthy, low-sodium diet, exercising more, and limiting alcohol. This information is  not intended to replace advice given to you by your health care provider. Make sure you discuss any questions you have with your health care provider. Document Released: 02/18/2005 Document Revised: 01/17/2016 Document Reviewed: 01/17/2016 Elsevier Interactive Patient Education  2018 [employer].     IF you received an x-ray today, you will receive an invoice from [facility]. Please contact [facility] at [phone] with questions or concerns regarding your invoice.   IF you received labwork today, you will receive an invoice from [facility]. Please contact [facility] at [phone] with questions or concerns regarding your invoice.   Our billing staff will not be able to assist you with questions regarding bills from these companies.  You will be contacted with the lab results as soon as they are available. The fastest way to get your results is to activate your My Chart account. Instructions are located on the last page of this paperwork. If you have not heard from [place] regarding the results in 2 weeks, please contact this office.

## 2017-08-15 LAB — COMPREHENSIVE METABOLIC PANEL
ALT: 27 IU/L (ref 0–44)
AST: 27 IU/L (ref 0–40)
Albumin/Globulin Ratio: 1.6 (ref 1.2–2.2)
Albumin: 4.7 g/dL (ref 3.6–4.8)
Alkaline Phosphatase: 83 IU/L (ref 39–117)
BILIRUBIN TOTAL: 0.4 mg/dL (ref 0.0–1.2)
BUN / CREAT RATIO: 10 (ref 10–24)
BUN: 9 mg/dL (ref 8–27)
CO2: 25 mmol/L (ref 20–29)
CREATININE: 0.91 mg/dL (ref 0.76–1.27)
Calcium: 9.9 mg/dL (ref 8.6–10.2)
Chloride: 102 mmol/L (ref 96–106)
GFR calc non Af Amer: 86 mL/min/{1.73_m2} (ref >59)
GFR, EST AFRICAN AMERICAN: 100 mL/min/{1.73_m2} (ref >59)
GLUCOSE: 88 mg/dL (ref 65–99)
Globulin, Total: 3 g/dL (ref 1.5–4.5)
Potassium: 4.3 mmol/L (ref 3.5–5.2)
Sodium: 141 mmol/L (ref 134–144)
TOTAL PROTEIN: 7.7 g/dL (ref 6.0–8.5)

## 2017-08-15 LAB — HEPATITIS B SURFACE ANTIGEN: Hepatitis B Surface Ag: NEGATIVE

## 2017-08-18 ENCOUNTER — Telehealth: Payer: Self-pay | Admitting: Urgent Care

## 2017-08-18 NOTE — Telephone Encounter (Signed)
Copied from [place] [id number]. Topic: Inquiry >> Aug 18, 2017  8:08 AM Margot Ables wrote: Reason for CRM: pt called stating he missed a call Saturday - per lab notes results were given to pt but he says he didn't speak to anyone and did not have voicemail. Please call to review.

## 2017-09-12 ENCOUNTER — Ambulatory Visit: Payer: Medicare HMO | Admitting: Urgent Care

## 2017-09-26 ENCOUNTER — Encounter: Payer: Self-pay | Admitting: Urgent Care

## 2017-09-26 ENCOUNTER — Other Ambulatory Visit: Payer: Self-pay

## 2017-09-26 ENCOUNTER — Ambulatory Visit (INDEPENDENT_AMBULATORY_CARE_PROVIDER_SITE_OTHER): Payer: Medicare HMO | Admitting: Urgent Care

## 2017-09-26 VITALS — BP 130/68 | HR 64 | Temp 97.9°F | Resp 16 | Ht 74.0 in | Wt 197.4 lb

## 2017-09-26 DIAGNOSIS — Z789 Other specified health status: Secondary | ICD-10-CM

## 2017-09-26 DIAGNOSIS — R7989 Other specified abnormal findings of blood chemistry: Secondary | ICD-10-CM

## 2017-09-26 DIAGNOSIS — R030 Elevated blood-pressure reading, without diagnosis of hypertension: Secondary | ICD-10-CM

## 2017-09-26 DIAGNOSIS — R69 Illness, unspecified: Secondary | ICD-10-CM | POA: Diagnosis not present

## 2017-09-26 DIAGNOSIS — Z7289 Other problems related to lifestyle: Secondary | ICD-10-CM

## 2017-09-26 DIAGNOSIS — R739 Hyperglycemia, unspecified: Secondary | ICD-10-CM | POA: Diagnosis not present

## 2017-09-26 DIAGNOSIS — I10 Essential (primary) hypertension: Secondary | ICD-10-CM | POA: Diagnosis not present

## 2017-09-26 DIAGNOSIS — R945 Abnormal results of liver function studies: Secondary | ICD-10-CM

## 2017-09-26 DIAGNOSIS — F17200 Nicotine dependence, unspecified, uncomplicated: Secondary | ICD-10-CM

## 2017-09-26 MED ORDER — AMLODIPINE BESYLATE 10 MG PO TABS: 10.0000 mg | ORAL_TABLET | Freq: Every day | ORAL | 1 refills | Status: DC

## 2017-09-26 NOTE — Progress Notes (Signed)
    MRN: [id number] DOB: 06/16/1950  Subjective:   Nathan Stafford is a 67 y.o. male presenting for follow up on Hypertension. Currently managed with amlodipine 10 mg, hydrochlorothiazide 25 mg, metoprolol XL 50 mg once daily. Patient is checking blood pressure at home, generally 505W to 979Y systolic. Avoids salt in diet, is not exercising. Reports that he has cut back on his smoking and is down to 1 to 1.5 packs/day.  He has also not been drinking alcohol at all given his elevated liver enzymes.  Denies dizziness, chronic headache, blurred vision, chest pain, shortness of breath, heart racing, palpitations, nausea, vomiting, abdominal pain, hematuria, lower leg swelling.   Nathan Stafford has a current medication list which includes the following prescription(s): amlodipine, aspirin, brimonidine, dorzolamide-timolol, hydrochlorothiazide, latanoprost, metoprolol succinate, sildenafil, and simvastatin. Also is allergic to ace inhibitors.  Nathan Stafford  has a past medical history of Cardiomyopathy secondary, GERD (gastroesophageal reflux disease), Glaucoma, Hematuria, HLD (hyperlipidemia), HTN (hypertension), and Perinephric hematoma. Also  has a past surgical history that includes colonscopy; LASIK; and Eye surgery (Right, 1993).  Objective:   Vitals: BP 130/68   Pulse 64   Temp 97.9 F (36.6 C)   Resp 16   Ht 6\' 2"  (1.88 m)   Wt 197 lb 6.4 oz (89.5 kg)   SpO2 99%   BMI 25.34 kg/m   BP Readings from Last 3 Encounters:  09/26/17 130/68  08/14/17 (!) 164/90  05/09/17 128/70    Physical Exam  Constitutional: He is oriented to person, place, and time. He appears well-developed and well-nourished.  Cardiovascular: Normal rate.  Pulmonary/Chest: Effort normal.  Neurological: He is alert and oriented to person, place, and time.   Assessment and Plan :   Essential hypertension - Plan: amLODipine (NORVASC) 10 MG tablet  Elevated blood pressure reading  Elevated LFTs  Alcohol use  Elevated blood  sugar  Tobacco use disorder  Patient is doing very well.  I encouraged him to continue his efforts at healthier lifestyle including cutting back on his smoking.  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  We also reviewed diet friendly foods for his blood pressure and prediabetes.  Patient is going to monitor his blood pressure at home and come back in 3 months for recheck or sooner if necessary.  Refilled his amlodipine today.  He does not need refills for his other medications.  Jaynee Eagles, PA-C [facility] [phone] 09/26/2017  8:17 AM

## 2017-09-26 NOTE — Patient Instructions (Addendum)
Please check your blood pressure once weekly at the pharmacy, write your readings and bring them to your next office visit. Try to check the blood pressure around the same time each day that you check it after a period of resting in a seated position for 5-10 minutes. The readings should be between 712-458 systolic (top number), between 09-98 diastolic (bottom number). If your blood pressure falls outside this range consistently (i.e. 3-4 weeks in a row), then come back prior to your 3 month follow up so that we can adjust your medications.     Salads - kale, spinach, cabbage, spring mix; use seeds like pumpkin seeds or sunflower seeds; you can also use 1-2 hard boiled eggs. Fruits - avocadoes, berries (blueberries, raspberries, blackberries), apples, oranges, pomegranate, grapefruit, coconut, occasional banana Seeds - quinoa, chia seeds; you can also incorporate oatmeal Vegetables - aspargus, cauliflower, broccoli, green beans, brussel spouts, bell peppers; stay away from starchy vegetables like potatoes, carrots, peas  Brussel sprouts - Cut off stems. Place in a mixing bowl that has a lid. Pour in a 1/4-1/2 cup olive oil, spices, use a light amount of parmesan. Place on a baking sheet. Bake for 10 minutes at 400F. Take it out, eat the brussel chips. Place for another 5-10 minutes.   Mashed cauliflower - Boil a bunch of cauliflower in a pot of water. Blend in a food processor with 1-2 tablespoons of butter.  Spaghetti squash -  Cut the squash in half very carefully, clean out seeds from the middle. Place 1/2 face down in a microwave safe dish with at least 2 inches of water. Make 4-6 slits on outside of spaghetti squash and microwave for 10-12 minutes. Take out the spaghetti using a metal spoon. Repeat for the other half.   Vega protein is good protein powder, make sure you use ~6 ice cubes to give it smoothie consistency together with ~4-6 ounces of vanilla soy milk. Throw cinnamon into your  shake, use peanut butter. You can also use the fruits that I listed above. Throw spinach or kale into the shake.       Hypertension Hypertension, commonly called high blood pressure, is when the force of blood pumping through the arteries is too strong. The arteries are the blood vessels that carry blood from the heart throughout the body. Hypertension forces the heart to work harder to pump blood and may cause arteries to become narrow or stiff. Having untreated or uncontrolled hypertension can cause heart attacks, strokes, kidney disease, and other problems. A blood pressure reading consists of a higher number over a lower number. Ideally, your blood pressure should be below 120/80. The first ("top") number is called the systolic pressure. It is a measure of the pressure in your arteries as your heart beats. The second ("bottom") number is called the diastolic pressure. It is a measure of the pressure in your arteries as the heart relaxes. What are the causes? The cause of this condition is not known. What increases the risk? Some risk factors for high blood pressure are under your control. Others are not. Factors you can change  Smoking.  Having type 2 diabetes mellitus, high cholesterol, or both.  Not getting enough exercise or physical activity.  Being overweight.  Having too much fat, sugar, calories, or salt (sodium) in your diet.  Drinking too much alcohol. Factors that are difficult or impossible to change  Having chronic kidney disease.  Having a family history of high blood pressure.  Age. Risk  increases with age.  Race. You may be at higher risk if you are African-American.  Gender. Men are at higher risk than women before age 38. After age 53, women are at higher risk than men.  Having obstructive sleep apnea.  Stress. What are the signs or symptoms? Extremely high blood pressure (hypertensive crisis) may cause:  Headache.  Anxiety.  Shortness of  breath.  Nosebleed.  Nausea and vomiting.  Severe chest pain.  Jerky movements you cannot control (seizures).  How is this diagnosed? This condition is diagnosed by measuring your blood pressure while you are seated, with your arm resting on a surface. The cuff of the blood pressure monitor will be placed directly against the skin of your upper arm at the level of your heart. It should be measured at least twice using the same arm. Certain conditions can cause a difference in blood pressure between your right and left arms. Certain factors can cause blood pressure readings to be lower or higher than normal (elevated) for a short period of time:  When your blood pressure is higher when you are in a health care provider's office than when you are at home, this is called white coat hypertension. Most people with this condition do not need medicines.  When your blood pressure is higher at home than when you are in a health care provider's office, this is called masked hypertension. Most people with this condition may need medicines to control blood pressure.  If you have a high blood pressure reading during one visit or you have normal blood pressure with other risk factors:  You may be asked to return on a different day to have your blood pressure checked again.  You may be asked to monitor your blood pressure at home for 1 week or longer.  If you are diagnosed with hypertension, you may have other blood or imaging tests to help your health care provider understand your overall risk for other conditions. How is this treated? This condition is treated by making healthy lifestyle changes, such as eating healthy foods, exercising more, and reducing your alcohol intake. Your health care provider may prescribe medicine if lifestyle changes are not enough to get your blood pressure under control, and if:  Your systolic blood pressure is above 130.  Your diastolic blood pressure is above  80.  Your personal target blood pressure may vary depending on your medical conditions, your age, and other factors. Follow these instructions at home: Eating and drinking  Eat a diet that is high in fiber and potassium, and low in sodium, added sugar, and fat. An example eating plan is called the DASH (Dietary Approaches to Stop Hypertension) diet. To eat this way: ? Eat plenty of fresh fruits and vegetables. Try to fill half of your plate at each meal with fruits and vegetables. ? Eat whole grains, such as whole wheat pasta, brown rice, or whole grain bread. Fill about one quarter of your plate with whole grains. ? Eat or drink low-fat dairy products, such as skim milk or low-fat yogurt. ? Avoid fatty cuts of meat, processed or cured meats, and poultry with skin. Fill about one quarter of your plate with lean proteins, such as fish, chicken without skin, beans, eggs, and tofu. ? Avoid premade and processed foods. These tend to be higher in sodium, added sugar, and fat.  Reduce your daily sodium intake. Most people with hypertension should eat less than 1,500 mg of sodium a day.  Limit alcohol  intake to no more than 1 drink a day for nonpregnant women and 2 drinks a day for men. One drink equals 12 oz of beer, 5 oz of wine, or 1 oz of hard liquor. Lifestyle  Work with your health care provider to maintain a healthy body weight or to lose weight. Ask what an ideal weight is for you.  Get at least 30 minutes of exercise that causes your heart to beat faster (aerobic exercise) most days of the week. Activities may include walking, swimming, or biking.  Include exercise to strengthen your muscles (resistance exercise), such as pilates or lifting weights, as part of your weekly exercise routine. Try to do these types of exercises for 30 minutes at least 3 days a week.  Do not use any products that contain nicotine or tobacco, such as cigarettes and e-cigarettes. If you need help quitting, ask  your health care provider.  Monitor your blood pressure at home as told by your health care provider.  Keep all follow-up visits as told by your health care provider. This is important. Medicines  Take over-the-counter and prescription medicines only as told by your health care provider. Follow directions carefully. Blood pressure medicines must be taken as prescribed.  Do not skip doses of blood pressure medicine. Doing this puts you at risk for problems and can make the medicine less effective.  Ask your health care provider about side effects or reactions to medicines that you should watch for. Contact a health care provider if:  You think you are having a reaction to a medicine you are taking.  You have headaches that keep coming back (recurring).  You feel dizzy.  You have swelling in your ankles.  You have trouble with your vision. Get help right away if:  You develop a severe headache or confusion.  You have unusual weakness or numbness.  You feel faint.  You have severe pain in your chest or abdomen.  You vomit repeatedly.  You have trouble breathing. Summary  Hypertension is when the force of blood pumping through your arteries is too strong. If this condition is not controlled, it may put you at risk for serious complications.  Your personal target blood pressure may vary depending on your medical conditions, your age, and other factors. For most people, a normal blood pressure is less than 120/80.  Hypertension is treated with lifestyle changes, medicines, or a combination of both. Lifestyle changes include weight loss, eating a healthy, low-sodium diet, exercising more, and limiting alcohol. This information is not intended to replace advice given to you by your health care provider. Make sure you discuss any questions you have with your health care provider. Document Released: 02/18/2005 Document Revised: 01/17/2016 Document Reviewed: 01/17/2016 Elsevier  Interactive Patient Education  2018 [employer].     IF you received an x-ray today, you will receive an invoice from [facility]. Please contact [facility] at [phone] with questions or concerns regarding your invoice.   IF you received labwork today, you will receive an invoice from [place]. Please contact [facility] at [phone] with questions or concerns regarding your invoice.   Our billing staff will not be able to assist you with questions regarding bills from these companies.  You will be contacted with the lab results as soon as they are available. The fastest way to get your results is to activate your My Chart account. Instructions are located on the last page of this paperwork. If you have not heard from [place]  regarding the results in 2 weeks, please contact this office.

## 2017-10-24 ENCOUNTER — Telehealth: Payer: Self-pay | Admitting: Urgent Care

## 2017-11-18 DIAGNOSIS — H5213 Myopia, bilateral: Secondary | ICD-10-CM | POA: Diagnosis not present

## 2017-12-25 NOTE — Telephone Encounter (Signed)
done

## 2017-12-26 ENCOUNTER — Ambulatory Visit: Payer: Medicare HMO | Admitting: Urgent Care

## 2018-05-21 DIAGNOSIS — H401113 Primary open-angle glaucoma, right eye, severe stage: Secondary | ICD-10-CM | POA: Diagnosis not present

## 2018-05-29 ENCOUNTER — Ambulatory Visit: Payer: Medicare HMO | Admitting: Emergency Medicine

## 2018-05-29 ENCOUNTER — Other Ambulatory Visit: Payer: Self-pay

## 2018-05-29 ENCOUNTER — Telehealth (INDEPENDENT_AMBULATORY_CARE_PROVIDER_SITE_OTHER): Payer: Medicare HMO | Admitting: Emergency Medicine

## 2018-05-29 DIAGNOSIS — Z7689 Persons encountering health services in other specified circumstances: Secondary | ICD-10-CM

## 2018-05-29 DIAGNOSIS — I119 Hypertensive heart disease without heart failure: Secondary | ICD-10-CM

## 2018-05-29 DIAGNOSIS — I10 Essential (primary) hypertension: Secondary | ICD-10-CM

## 2018-05-29 NOTE — Progress Notes (Signed)
Telemedicine Encounter- SOAP NOTE Established Patient  This telephone encounter was conducted with the patient's (or proxy's) verbal consent via audio telecommunications: yes/no: Yes Patient was instructed to have this encounter in a suitably private space; and to only have persons present to whom they give permission to participate. In addition, patient identity was confirmed by use of name plus two identifiers (DOB and address).  I discussed the limitations, risks, security and privacy concerns of performing an evaluation and management service by telephone and the availability of in person appointments. I also discussed with the patient that there may be a patient responsible charge related to this service. The patient expressed understanding and agreed to proceed.  I spent a total of TIME; 0 MIN TO 60 MIN: 15 minutes talking with the patient or their proxy.  No chief complaint on file. Follow-up on chronic medical problems  Subjective   Nathan Stafford is a 68 y.o. male established patient.  First visit with me.  Used to see PA [employer].  Telephone visit today for follow-up of hypertension, hypertensive heart disease.  Has no complaints or medical concerns today.   BP Readings from Last 3 Encounters:  09/26/17 130/68  08/14/17 (!) 164/90  05/09/17 128/70    HPI   Patient Active Problem List   Diagnosis Date Noted  . Erectile dysfunction 07/11/2016  . RBBB 07/11/2016  . 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 Abnormal EKG 07/11/2011  . HTN (hypertension) 07/11/2011  . Tobacco abuse 07/11/2011  . Dyslipidemia 07/11/2011    Past Medical History:  Diagnosis Date  . Cardiomyopathy secondary    likely HTN cardiomyopathy;  Echocardiogram was obtained 07/18/11: Moderate LVH, EF 84-69%, grade 1 diastolic dysfunction, mild MR, mild RAE.;  cardiac cath in 2008 with normal coronary arteries.  Marland Kitchen GERD (gastroesophageal reflux disease)   . Glaucoma   . Hematuria   . HLD  (hyperlipidemia)   . HTN (hypertension)   . Perinephric hematoma     Current Outpatient Medications  Medication Sig Dispense Refill  . amLODipine (NORVASC) 10 MG tablet Take 1 tablet (10 mg total) by mouth daily. 90 tablet 1  . aspirin 81 MG tablet Take 1 tablet (81 mg total) by mouth daily. 90 tablet 3  . brimonidine (ALPHAGAN) 0.2 % ophthalmic solution Place 1 drop into both eyes 2 (two) times daily.     . dorzolamide-timolol (COSOPT) 22.3-6.8 MG/ML ophthalmic solution Place 1 drop into both eyes 2 (two) times daily.     . hydrochlorothiazide (HYDRODIURIL) 25 MG tablet Take one tablet by mouth once daily. 90 tablet 3  . latanoprost (XALATAN) 0.005 % ophthalmic solution Place 1 drop into both eyes at bedtime.     . metoprolol succinate (TOPROL-XL) 50 MG 24 hr tablet Take 1 tablet (50 mg total) by mouth daily. Take with or immediately following a meal. 90 tablet 3  . sildenafil (VIAGRA) 50 MG tablet Take 1 tablet (50 mg total) by mouth daily as needed for erectile dysfunction. 30 tablet 1  . simvastatin (ZOCOR) 20 MG tablet Take 1 tablet (20 mg total) by mouth at bedtime. 90 tablet 3   No current facility-administered medications for this visit.     Allergies  Allergen Reactions  . Ace Inhibitors Swelling    Social History   Socioeconomic History  . Marital status: Married    Spouse name: Not on file  . Number of children: 1  . Years of education: Not on file  . Highest education level:  Not on file  Occupational History  . Occupation: Receiving Armed forces operational officer: [employer]  . Financial resource strain: Not on file  . Food insecurity:    Worry: Not on file    Inability: Not on file  . Transportation needs:    Medical: Not on file    Non-medical: Not on file  Tobacco Use  . Smoking status: Current Every Day Smoker    Packs/day: 0.50    Years: 30.00    Pack years: 15.00    Types: Cigarettes  . Smokeless tobacco: Never Used  Substance and Sexual Activity  .  Alcohol use: Yes    Alcohol/week: 1.0 - 2.0 standard drinks    Types: 1 - 2 Cans of beer per week    Comment: occ  . Drug use: No  . Sexual activity: Yes  Lifestyle  . Physical activity:    Days per week: Not on file    Minutes per session: Not on file  . Stress: Not on file  Relationships  . Social connections:    Talks on phone: Not on file    Gets together: Not on file    Attends religious service: Not on file    Active member of club or organization: Not on file    Attends meetings of clubs or organizations: Not on file    Relationship status: Not on file  . Intimate partner violence:    Fear of current or ex partner: Not on file    Emotionally abused: Not on file    Physically abused: Not on file    Forced sexual activity: Not on file  Other Topics Concern  . Not on file  Social History Narrative   Lives with his wife.    Review of Systems  Constitutional: Negative.  Negative for chills and fever.  HENT: Negative for congestion and sore throat.   Eyes: Negative for discharge and redness.  Respiratory: Negative for cough and shortness of breath.   Cardiovascular: Negative for chest pain and palpitations.  Gastrointestinal: Negative for abdominal pain, diarrhea, nausea and vomiting.  Musculoskeletal: Negative for myalgias.  Skin: Negative for rash.  Neurological: Negative for dizziness and headaches.  Endo/Heme/Allergies: Negative.   All other systems reviewed and are negative.   Objective   Vitals as reported by the patient: None available There were no vitals filed for this visit.  There are no diagnoses linked to this encounter. Diagnoses and all orders for this visit:  Hypertensive heart disease, unspecified whether heart failure present  Essential hypertension  Encounter to establish care    No medical concerns identified on this visit. Continue present medications. Follow-up in 3 months.  I discussed the assessment and treatment plan with the  patient. The patient was provided an opportunity to ask questions and all were answered. The patient agreed with the plan and demonstrated an understanding of the instructions.   The patient was advised to call back or seek an in-person evaluation if the symptoms worsen or if the condition fails to improve as anticipated.  I provided 15 minutes of non-face-to-face time during this encounter.  Horald Pollen, MD  Primary Care at [facility]

## 2018-05-29 NOTE — Progress Notes (Signed)
Transition of care from [facility]

## 2018-06-18 ENCOUNTER — Telehealth: Payer: Self-pay

## 2018-06-18 NOTE — Telephone Encounter (Signed)
Virtual Visit Pre-Appointment Phone Call  Steps For Call:  1. Confirm consent - "In the setting of the current Covid19 crisis, you are scheduled for a (phone or video) visit with your provider on (date) at (time).  Just as we do with many in-office visits, in order for you to participate in this visit, we must obtain consent.  If you'd like, I can send this to your mychart (if signed up) or email for you to review.  Otherwise, I can obtain your verbal consent now.  All virtual visits are billed to your insurance company just like a normal visit would be.  By agreeing to a virtual visit, we'd like you to understand that the technology does not allow for your provider to perform an examination, and thus may limit your provider's ability to fully assess your condition.  Finally, though the technology is pretty good, we cannot assure that it will always work on either your or our end, and in the setting of a video visit, we may have to convert it to a phone-only visit.  In either situation, we cannot ensure that we have a secure connection.  Are you willing to proceed?" STAFF: Did the patient verbally acknowledge consent to telehealth visit? Document YES/NO here: Patient provided verbal consent  2. Confirm the BEST phone number to call the day of the visit by including in appointment notes  3. Give patient instructions for WebEx/MyChart download to smartphone as below or Doximity/Doxy.me if video visit (depending on what platform provider is using)  4. Advise patient to be prepared with their blood pressure, heart rate, weight, any heart rhythm information, their current medicines, and a piece of paper and pen handy for any instructions they may receive the day of their visit  5. Inform patient they will receive a phone call 15 minutes prior to their appointment time (may be from unknown caller ID) so they should be prepared to answer  6. Confirm that appointment type is correct in Epic appointment  notes (VIDEO vs PHONE)     TELEPHONE CALL NOTE  Nathan Stafford has been deemed a candidate for a follow-up tele-health visit to limit community exposure during the Covid-19 pandemic. I spoke with the patient via phone to ensure availability of phone/video source, confirm preferred email & phone number, and discuss instructions and expectations.  I reminded Dane Kopke to be prepared with any vital sign and/or heart rhythm information that could potentially be obtained via home monitoring, at the time of his visit. I reminded Ocean Schildt to expect a phone call at the time of his visit if his visit.  Mitzie Na, [place] 06/18/2018 2:25 PM   INSTRUCTIONS FOR DOWNLOADING THE [place] APP TO SMARTPHONE  - If Apple, ask patient to go to [employer] and type in WebEx in the search bar. [employer], the blue/green circle. If Android, go to [employer] and type in [employer] in the search bar. The app is free but as with any other app downloads, their phone may require them to verify saved payment information or Apple/Android password.  - The patient does NOT have to create an account. - On the day of the visit, the assist will walk the patient through joining the meeting with the meeting number/password.  INSTRUCTIONS FOR DOWNLOADING THE MYCHART APP TO SMARTPHONE  - The patient must first make sure to have activated MyChart and know their login information - If Apple, go to [employer] and type  in [place] in the search bar and download the app. If Android, ask patient to go to [employer] and type in [place] in the search bar and download the app. The app is free but as with any other app downloads, their phone may require them to verify saved payment information or Apple/Android password.  - The patient will need to then log into the app with their MyChart username and password, and select [facility] as their healthcare provider to link the account. When it is time for your  visit, go to the MyChart app, find appointments, and click Begin Video Visit. Be sure to Select Allow for your device to access the Microphone and Camera for your visit. You will then be connected, and your provider will be with you shortly.  **If they have any issues connecting, or need assistance please contact MyChart service desk [phone])**  **If using a computer, in order to ensure the best quality for their visit they will need to use either of the following Internet Browsers: [employer], or Google Chrome**  IF USING DOXIMITY or DOXY.ME - The patient will receive a link just prior to their visit, either by text or email (to be determined day of appointment depending on if it's doxy.me or Doximity).     FULL LENGTH CONSENT FOR TELE-HEALTH VISIT   I hereby voluntarily request, consent and authorize [facility] and its employed or contracted physicians, physician assistants, nurse practitioners or other licensed health care professionals (the Practitioner), to provide me with telemedicine health care services (the "Services") as deemed necessary by the treating Practitioner. I acknowledge and consent to receive the Services by the Practitioner via telemedicine. I understand that the telemedicine visit will involve communicating with the Practitioner through live audiovisual communication technology and the disclosure of certain medical information by electronic transmission. I acknowledge that I have been given the opportunity to request an in-person assessment or other available alternative prior to the telemedicine visit and am voluntarily participating in the telemedicine visit.  I understand that I have the right to withhold or withdraw my consent to the use of telemedicine in the course of my care at any time, without affecting my right to future care or treatment, and that the Practitioner or I may terminate the telemedicine visit at any time. I understand that I have  the right to inspect all information obtained and/or recorded in the course of the telemedicine visit and may receive copies of available information for a reasonable fee.  I understand that some of the potential risks of receiving the Services via telemedicine include:  Marland Kitchen Delay or interruption in medical evaluation due to technological equipment failure or disruption; . Information transmitted may not be sufficient (e.g. poor resolution of images) to allow for appropriate medical decision making by the Practitioner; and/or  . In rare instances, security protocols could fail, causing a breach of personal health information.  Furthermore, I acknowledge that it is my responsibility to provide information about my medical history, conditions and care that is complete and accurate to the best of my ability. I acknowledge that Practitioner's advice, recommendations, and/or decision may be based on factors not within their control, such as incomplete or inaccurate data provided by me or distortions of diagnostic images or specimens that may result from electronic transmissions. I understand that the practice of medicine is not an exact science and that Practitioner makes no warranties or guarantees regarding treatment outcomes. I acknowledge that I will receive a  copy of this consent concurrently upon execution via email to the email address I last provided but may also request a printed copy by calling the office of [place].    I understand that my insurance will be billed for this visit.   I have read or had this consent read to me. . I understand the contents of this consent, which adequately explains the benefits and risks of the Services being provided via telemedicine.  . I have been provided ample opportunity to ask questions regarding this consent and the Services and have had my questions answered to my satisfaction. . I give my informed consent for the services to be provided through the use  of telemedicine in my medical care  By participating in this telemedicine visit I agree to the above.

## 2018-06-19 ENCOUNTER — Telehealth: Payer: Self-pay | Admitting: Cardiology

## 2018-06-19 NOTE — Telephone Encounter (Signed)
No smartphone/no computer/consent obtained/pre reg complete/dc/04.17.2020

## 2018-06-21 NOTE — Progress Notes (Signed)
Virtual Visit via Telephone Note   This visit type was conducted due to national recommendations for restrictions regarding the COVID-19 Pandemic (e.g. social distancing) in an effort to limit this patient's exposure and mitigate transmission in our community.  Due to his co-morbid illnesses, this patient is at least at moderate risk for complications without adequate follow up.  This format is felt to be most appropriate for this patient at this time.  The patient did not have access to video technology/had technical difficulties with video requiring transitioning to audio format only (telephone).  All issues noted in this document were discussed and addressed.  No physical exam could be performed with this format.  Please refer to the patient's chart for his  consent to telehealth for [facility].   Evaluation Performed:  Follow-up visit  Date:  06/22/2018   ID:  Nathan Stafford, DOB Jan 04, 1951, MRN [id number]  Patient Location: Home Provider Location: Home  PCP:  Patient, No Pcp Per  Cardiologist:  Minus Breeding, MD  Electrophysiologist:  None   Chief Complaint:  Cardiomyopathy  History of Present Illness:    Nathan Stafford is a 68 y.o. male who presents for follow up of NICM.  This was thought to be secondary to HTN.  He had normal coronaries on cath in 2008.  The last EF in 2013 was 45%. It was about the same or slightly better when he was last seen in 2018.  I reviewed that echocardiogram for this visit.  He denies any ongoing cardiovascular symptoms such as chest pressure, neck or arm discomfort.  He has had no new palpitations, presyncope or syncope.  He is a part-time job.  With this he denies any cardiovascular symptoms.  He says he is very good about taking his medicines.   The patient does not have symptoms concerning for COVID-19 infection (fever, chills, cough, or new shortness of breath).    Past Medical History:  Diagnosis Date  . 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  Marland Kitchen GERD (gastroesophageal reflux disease)   . Glaucoma   . Hematuria   . HLD (hyperlipidemia)   . HTN (hypertension)   . Perinephric hematoma    Past Surgical History:  Procedure Laterality Date  . colonscopy    . EYE SURGERY Right 1993  . LASIK       Current Meds  Medication Sig  . amLODipine (NORVASC) 10 MG tablet Take 1 tablet (10 mg total) by mouth daily.  Marland Kitchen aspirin 81 MG tablet Take 1 tablet (81 mg total) by mouth daily.  . brimonidine (ALPHAGAN) 0.2 % ophthalmic solution Place 1 drop into both eyes 2 (two) times daily.   . dorzolamide-timolol (COSOPT) 22.3-6.8 MG/ML ophthalmic solution Place 1 drop into both eyes 2 (two) times daily.   . hydrochlorothiazide (HYDRODIURIL) 25 MG tablet Take one tablet by mouth once daily.  . metoprolol succinate (TOPROL-XL) 50 MG 24 hr tablet Take 1 tablet (50 mg total) by mouth daily. Take with or immediately following a meal.  . sildenafil (VIAGRA) 50 MG tablet Take 1 tablet (50 mg total) by mouth daily as needed for erectile dysfunction.  . simvastatin (ZOCOR) 20 MG tablet Take 1 tablet (20 mg total) by mouth at bedtime.     Allergies:   Ace inhibitors   Social History   Tobacco Use  .  Smoking status: Current Every Day Smoker    Packs/day: 0.50    Years: 30.00    Pack years: 15.00    Types: Cigarettes  . Smokeless tobacco: Never Used  Substance Use Topics  . Alcohol use: Yes    Alcohol/week: 1.0 - 2.0 standard drinks    Types: 1 - 2 Cans of beer per week    Comment: occ  . Drug use: No     Family Hx: The patient's family history includes Diabetes in his father.  ROS:   Please see the history of present illness.    Positive for "gas" All other systems reviewed and are negative.   Prior CV studies:   The following studies were reviewed today:     Labs/Other Tests and Data Reviewed:    EKG:  No ECG reviewed.  Recent Labs: 08/14/2017: ALT 27; BUN 9; Creatinine, Ser 0.91; Potassium 4.3; Sodium 141   Recent Lipid Panel Lab Results  Component Value Date/Time   CHOL 167 05/09/2017 10:11 AM   TRIG 99 05/09/2017 10:11 AM   HDL 46 05/09/2017 10:11 AM   CHOLHDL 3.6 05/09/2017 10:11 AM   CHOLHDL 4.2 04/26/2015 02:40 PM   LDLCALC 101 (H) 05/09/2017 10:11 AM    Wt Readings from Last 3 Encounters:  09/26/17 197 lb 6.4 oz (89.5 kg)  08/14/17 201 lb (91.2 kg)  05/09/17 197 lb 9.6 oz (89.6 kg)     Objective:    Vital Signs:  There were no vitals taken for this visit.     ASSESSMENT & PLAN:    NON ISCHEMIC CARDIOMYOPATHY:   His ejection fraction was stable if not slightly improved in May 2018 but he has no symptoms.  I do not think to be any indication for further imaging.  At this point no further testing is indicated.  He will continue with aggressive risk reduction.   HTN:  The blood pressure was at target at the last visit.  He is going to look into getting a BP cuff so that we can follow this.  No change in therapy. He will send to me the BP readings.   ED:     I am OK with giving a limited Viagra prescription if requested.   RBBB:   This has been chronic.  No change in therapy.    COVID-19 Education: The signs and symptoms of COVID-19 were discussed with the patient and how to seek care for testing (follow up with PCP or arrange E-visit).  The importance of social distancing was discussed today.  Time:   Today, I have spent 21 minutes with the patient with telehealth technology discussing the above problems.     Medication Adjustments/Labs and Tests Ordered: Current medicines are reviewed at length with the patient today.  Concerns regarding medicines are outlined above.   Tests Ordered: No orders of the defined types were placed in this encounter.   Medication Changes: No orders of the defined types were  placed in this encounter.   Disposition:  Follow up 18 monthsl  Signed, Minus Breeding, MD  06/22/2018 3:01 PM    [facility]

## 2018-06-22 ENCOUNTER — Telehealth (INDEPENDENT_AMBULATORY_CARE_PROVIDER_SITE_OTHER): Payer: Medicare HMO | Admitting: Cardiology

## 2018-06-22 DIAGNOSIS — I10 Essential (primary) hypertension: Secondary | ICD-10-CM

## 2018-06-22 DIAGNOSIS — I429 Cardiomyopathy, unspecified: Secondary | ICD-10-CM | POA: Diagnosis not present

## 2018-06-22 NOTE — Patient Instructions (Signed)
.  Medication Instructions:  Dr Percival Spanish recommends that you continue on your current medications as directed. Please refer to the Current Medication list given to you today.  If you need a refill on your cardiac medications before your next appointment, please call your pharmacy.   Follow-Up: Dr Percival Spanish recommends that you schedule a follow-up appointment in 18 months. You will receive a reminder letter in the mail two months in advance. If you don't receive a letter, please call our office to schedule the follow-up appointment.

## 2018-06-23 ENCOUNTER — Encounter: Payer: Self-pay | Admitting: Cardiology

## 2018-07-02 ENCOUNTER — Ambulatory Visit: Payer: Medicare HMO | Admitting: Cardiology

## 2018-07-20 ENCOUNTER — Other Ambulatory Visit: Payer: Self-pay | Admitting: Physician Assistant

## 2018-07-20 DIAGNOSIS — I10 Essential (primary) hypertension: Secondary | ICD-10-CM

## 2018-07-23 NOTE — Telephone Encounter (Signed)
Yes

## 2018-07-23 NOTE — Telephone Encounter (Signed)
Pt calling to check on this.  States he is completely out of medication.

## 2018-07-24 ENCOUNTER — Other Ambulatory Visit: Payer: Self-pay | Admitting: Cardiology

## 2018-07-24 DIAGNOSIS — I10 Essential (primary) hypertension: Secondary | ICD-10-CM

## 2018-07-24 DIAGNOSIS — E782 Mixed hyperlipidemia: Secondary | ICD-10-CM

## 2018-07-24 MED ORDER — SIMVASTATIN 20 MG PO TABS: 20.0000 mg | ORAL_TABLET | Freq: Every day | ORAL | 3 refills | Status: DC

## 2018-07-24 MED ORDER — HYDROCHLOROTHIAZIDE 25 MG PO TABS: ORAL_TABLET | ORAL | 3 refills | Status: DC

## 2018-07-24 MED ORDER — METOPROLOL SUCCINATE ER 50 MG PO TB24: 50.0000 mg | ORAL_TABLET | Freq: Every day | ORAL | 3 refills | Status: DC

## 2018-07-24 MED ORDER — AMLODIPINE BESYLATE 10 MG PO TABS: 10.0000 mg | ORAL_TABLET | Freq: Every day | ORAL | 3 refills | Status: DC

## 2018-07-24 NOTE — Telephone Encounter (Signed)
Called patient back and told him I did not see any refill requests. Apologized for inconvenience and asked which medications he is needing. All cardiac meds refilled to [facility] on [address].

## 2018-07-24 NOTE — Telephone Encounter (Signed)
Pt called and left message asking for his blood pressure medications and cholesterol medications to be refilled and sent to his pharmacy, pt states that he is out of his medications and would like for someone to give him a call back at [phone]. Please address

## 2018-07-29 ENCOUNTER — Ambulatory Visit: Payer: Self-pay

## 2018-08-03 ENCOUNTER — Other Ambulatory Visit: Payer: Self-pay

## 2018-08-03 ENCOUNTER — Ambulatory Visit (INDEPENDENT_AMBULATORY_CARE_PROVIDER_SITE_OTHER): Payer: Medicare HMO | Admitting: Emergency Medicine

## 2018-08-03 VITALS — BP 130/68 | Ht 74.0 in | Wt 197.0 lb

## 2018-08-03 DIAGNOSIS — Z0000 Encounter for general adult medical examination without abnormal findings: Secondary | ICD-10-CM

## 2018-08-03 NOTE — Progress Notes (Addendum)
Presents today for [employer] Visit   Date of last exam: 05/29/2018  Interpreter used for this visit? No  I connected with  Nathan Stafford on 08/03/18 by a  telephone confirmed that I am speaking with the correct person using two identifiers.   . The patient expressed understanding and agreed to proceed.   Patient Care Team: Patient, No Pcp Per as PCP - General (General Practice) Minus Breeding, MD as PCP - Cardiology (Cardiology)   O ther items to address today:   Discussed immunizations TDAP declined due to insurance Discussed Eye/dental yearly exams Patient has Glaucoma sees eye doctor regularly Patient will schedule 3 month follow up in July     Other Screening: Last screening for diabetes: 08/14/2017 Last lipid screening: 05-09-2017  ADVANCE DIRECTIVES: Discussed: yes On File: no Materials Provided: yes (mailed)  Immunization status:  Immunization History  Administered Date(s) Administered  . Pneumococcal Conjugate-13 04/26/2015     There are no preventive care reminders to display for this patient.   Functional Status Survey: Is the patient deaf or have difficulty hearing?: No Does the patient have difficulty seeing, even when wearing glasses/contacts?: Yes(glaucoma) Does the patient have difficulty concentrating, remembering, or making decisions?: No Does the patient have difficulty walking or climbing stairs?: No Does the patient have difficulty dressing or bathing?: No Does the patient have difficulty doing errands alone such as visiting a doctor's office or shopping?: No   6CIT Screen 08/03/2018  What Year? 0 points  What month? 0 points  What time? 0 points  Count back from 20 0 points  Months in reverse 0 points  Repeat phrase 0 points  Total Score 0         Home Environment:   Lives in senior living one story home by himself  No trouble climbing stairs No scattered rugs Grab bars in bathroom.   Patient Active  Problem List   Diagnosis Date Noted  . Erectile dysfunction 07/11/2016  . RBBB 07/11/2016  . 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 Abnormal EKG 07/11/2011  . HTN (hypertension) 07/11/2011  . Tobacco abuse 07/11/2011  . Dyslipidemia 07/11/2011     Past Medical History:  Diagnosis Date  . 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  Marland Kitchen GERD (gastroesophageal reflux disease)   . Glaucoma   . Hematuria   . HLD (hyperlipidemia)   . HTN (hypertension)   . Perinephric hematoma      Past Surgical History:  Procedure Laterality Date  . colonscopy    . EYE SURGERY Right 1993  . LASIK       Family History  Problem Relation Age of Onset  . Diabetes Father      Social History   Socioeconomic History  . Marital status: Married    Spouse name: Not on file  . Number of children: 1  . Years of education: Not on file  . Highest education level: Not on file  Occupational History  . Occupation: Receiving Armed forces operational officer: [employer]  . Financial resource strain: Not on file  . Food insecurity:    Worry: Not on file    Inability: Not on file  . Transportation needs:    Medical: Not on file    Non-medical: Not on file  Tobacco Use  .  Smoking status: Current Every Day Smoker    Packs/day: 0.50    Years: 30.00    Pack years: 15.00    Types: Cigarettes  . Smokeless tobacco: Never Used  Substance and Sexual Activity  . Alcohol use: Yes    Alcohol/week: 1.0 - 2.0 standard drinks    Types: 1 - 2 Cans of beer per week    Comment: occ  . Drug use: No  . Sexual activity: Yes  Lifestyle  . Physical activity:    Days per week: Not on file    Minutes per session: Not on file  . Stress: Not on file  Relationships  . Social connections:    Talks on phone: Not on file    Gets  together: Not on file    Attends religious service: Not on file    Active member of club or organization: Not on file    Attends meetings of clubs or organizations: Not on file    Relationship status: Not on file  . Intimate partner violence:    Fear of current or ex partner: Not on file    Emotionally abused: Not on file    Physically abused: Not on file    Forced sexual activity: Not on file  Other Topics Concern  . Not on file  Social History Narrative   Lives with his wife.     Allergies  Allergen Reactions  . Ace Inhibitors Swelling     Prior to Admission medications   Medication Sig Start Date End Date Taking? Authorizing Provider  amLODipine (NORVASC) 10 MG tablet Take 1 tablet (10 mg total) by mouth daily. 07/24/18  Yes Minus Breeding, MD  aspirin 81 MG tablet Take 1 tablet (81 mg total) by mouth daily. 05/09/17  Yes Tereasa Coop, PA-C  brimonidine (ALPHAGAN) 0.2 % ophthalmic solution Place 1 drop into both eyes 2 (two) times daily.    Yes [provider]  dorzolamide-timolol (COSOPT) 22.3-6.8 MG/ML ophthalmic solution Place 1 drop into both eyes 2 (two) times daily.    Yes [provider]  hydrochlorothiazide (HYDRODIURIL) 25 MG tablet Take one tablet by mouth once daily. 07/24/18  Yes Minus Breeding, MD  metoprolol succinate (TOPROL-XL) 50 MG 24 hr tablet Take 1 tablet (50 mg total) by mouth daily. Take with or immediately following a meal. 07/24/18  Yes Hochrein, Jeneen Rinks, MD  simvastatin (ZOCOR) 20 MG tablet Take 1 tablet (20 mg total) by mouth at bedtime. 07/24/18  Yes Minus Breeding, MD  sildenafil (VIAGRA) 50 MG tablet Take 1 tablet (50 mg total) by mouth daily as needed for erectile dysfunction. Patient not taking: Reported on 08/03/2018 07/11/16   Minus Breeding, MD     Depression screen [facility] 2/9 08/03/2018 05/29/2018 09/26/2017 08/14/2017 05/09/2017  Decreased Interest 0 0 0 0 0  Down, Depressed, Hopeless 0 0 0 0 0  PHQ - 2 Score 0 0 0 0 0     Fall  Risk  08/03/2018 05/29/2018 09/26/2017 08/14/2017 05/09/2017  Falls in the past year? 0 0 No No No  Number falls in past yr: 0 0 - - -  Injury with Fall? 0 0 - - -      PHYSICAL EXAM: BP 130/68   Ht 6\' 2"  (1.88 m)   Wt 197 lb (89.4 kg)   BMI 25.29 kg/m    Wt Readings from Last 3 Encounters:  08/03/18 197 lb (89.4 kg)  09/26/17 197 lb 6.4 oz (89.5 kg)  08/14/17 201 lb (  91.2 kg)     No exam data present    Physical Exam   Education/Counseling provided regarding diet and exercise, prevention of chronic diseases, smoking/tobacco cessation, if applicable, and reviewed "Covered Medicare Preventive Services."   ASSESSMENT/PLAN: 1. Medicare annual wellness visit, subsequent

## 2018-08-03 NOTE — Patient Instructions (Signed)
Thank you for taking time to come for your Medicare Wellness Visit. I appreciate your ongoing commitment to your health goals. Please review the following plan we discussed and let me know if I can assist you in the future.  Nathan Reinig LPN  Healthy Eating Following a healthy eating pattern may help you to achieve and maintain a healthy body weight, reduce the risk of chronic disease, and live a long and productive life. It is important to follow a healthy eating pattern at an appropriate calorie level for your body. Your nutritional needs should be met primarily through food by choosing a variety of nutrient-rich foods. What are tips for following this plan? Reading food labels  Read labels and choose the following: ? Reduced or low sodium. ? Juices with 100% fruit juice. ? Foods with low saturated fats and high polyunsaturated and monounsaturated fats. ? Foods with whole grains, such as whole wheat, cracked wheat, brown rice, and wild rice. ? Whole grains that are fortified with folic acid. This is recommended for women who are pregnant or who want to become pregnant.  Read labels and avoid the following: ? Foods with a lot of added sugars. These include foods that contain brown sugar, corn sweetener, corn syrup, dextrose, fructose, glucose, high-fructose corn syrup, honey, invert sugar, lactose, malt syrup, maltose, molasses, raw sugar, sucrose, trehalose, or turbinado sugar.  Do not eat more than the following amounts of added sugar per day:  6 teaspoons (25 g) for women.  9 teaspoons (38 g) for men. ? Foods that contain processed or refined starches and grains. ? Refined grain products, such as white flour, degermed cornmeal, white bread, and white rice. Shopping  Choose nutrient-rich snacks, such as vegetables, whole fruits, and nuts. Avoid high-calorie and high-sugar snacks, such as potato chips, fruit snacks, and candy.  Use oil-based dressings and spreads on foods instead of  solid fats such as butter, stick margarine, or cream cheese.  Limit pre-made sauces, mixes, and "instant" products such as flavored rice, instant noodles, and ready-made pasta.  Try more plant-protein sources, such as tofu, tempeh, black beans, edamame, lentils, nuts, and seeds.  Explore eating plans such as the Mediterranean diet or vegetarian diet. Cooking  Use oil to saut or stir-fry foods instead of solid fats such as butter, stick margarine, or lard.  Try baking, boiling, grilling, or broiling instead of frying.  Remove the fatty part of meats before cooking.  Steam vegetables in water or broth. Meal planning   At meals, imagine dividing your plate into fourths: ? One-half of your plate is fruits and vegetables. ? One-fourth of your plate is whole grains. ? One-fourth of your plate is protein, especially lean meats, poultry, eggs, tofu, beans, or nuts.  Include low-fat dairy as part of your daily diet. Lifestyle  Choose healthy options in all settings, including home, work, school, restaurants, or stores.  Prepare your food safely: ? Wash your hands after handling raw meats. ? Keep food preparation surfaces clean by regularly washing with hot, soapy water. ? Keep raw meats separate from ready-to-eat foods, such as fruits and vegetables. ? Cook seafood, meat, poultry, and eggs to the recommended internal temperature. ? Store foods at safe temperatures. In general:  Keep cold foods at 40F (4.4C) or below.  Keep hot foods at 140F (60C) or above.  Keep your freezer at 0F (-17.8C) or below.  Foods are no longer safe to eat when they have been between the temperatures of 40-140F (4.4-60C) for   for more than 2 hours. What foods should I eat? Fruits Aim to eat 2 cup-equivalents of fresh, canned (in natural juice), or frozen fruits each day. Examples of 1 cup-equivalent of fruit include 1 small apple, 8 large strawberries, 1 cup canned fruit,  cup dried fruit, or 1  cup 100% juice. Vegetables Aim to eat 2-3 cup-equivalents of fresh and frozen vegetables each day, including different varieties and colors. Examples of 1 cup-equivalent of vegetables include 2 medium carrots, 2 cups raw, leafy greens, 1 cup chopped vegetable (raw or cooked), or 1 medium baked potato. Grains Aim to eat 6 ounce-equivalents of whole grains each day. Examples of 1 ounce-equivalent of grains include 1 slice of bread, 1 cup ready-to-eat cereal, 3 cups popcorn, or  cup cooked rice, pasta, or cereal. Meats and other proteins Aim to eat 5-6 ounce-equivalents of protein each day. Examples of 1 ounce-equivalent of protein include 1 egg, 1/2 cup nuts or seeds, or 1 tablespoon (16 g) peanut butter. A cut of meat or fish that is the size of a deck of cards is about 3-4 ounce-equivalents.  Of the protein you eat each week, try to have at least 8 ounces come from seafood. This includes salmon, trout, herring, and anchovies. Dairy Aim to eat 3 cup-equivalents of fat-free or low-fat dairy each day. Examples of 1 cup-equivalent of dairy include 1 cup (240 mL) milk, 8 ounces (250 g) yogurt, 1 ounces (44 g) natural cheese, or 1 cup (240 mL) fortified soy milk. Fats and oils  Aim for about 5 teaspoons (21 g) per day. Choose monounsaturated fats, such as canola and olive oils, avocados, peanut butter, and most nuts, or polyunsaturated fats, such as sunflower, corn, and soybean oils, walnuts, pine nuts, sesame seeds, sunflower seeds, and flaxseed. Beverages  Aim for six 8-oz glasses of water per day. Limit coffee to three to five 8-oz cups per day.  Limit caffeinated beverages that have added calories, such as soda and energy drinks.  Limit alcohol intake to no more than 1 drink a day for nonpregnant women and 2 drinks a day for men. One drink equals 12 oz of beer (355 mL), 5 oz of wine (148 mL), or 1 oz of hard liquor (44 mL). Seasoning and other foods  Avoid adding excess amounts of salt to  your foods. Try flavoring foods with herbs and spices instead of salt.  Avoid adding sugar to foods.  Try using oil-based dressings, sauces, and spreads instead of solid fats. This information is based on general [place] nutrition guidelines. For more information, visit [employer]. Exact amounts may vary based on your nutrition needs. Summary  A healthy eating plan may help you to maintain a healthy weight, reduce the risk of chronic diseases, and stay active throughout your life.  Plan your meals. Make sure you eat the right portions of a variety of nutrient-rich foods.  Try baking, boiling, grilling, or broiling instead of frying.  Choose healthy options in all settings, including home, work, school, restaurants, or stores. This information is not intended to replace advice given to you by your health care provider. Make sure you discuss any questions you have with your health care provider. Document Released: 06/02/2017 Document Revised: 06/02/2017 Document Reviewed: 06/02/2017 Elsevier Interactive Patient Education  2019 [employer] with Quitting Smoking  Quitting smoking is a physical and mental challenge. You will face cravings, withdrawal symptoms, and temptation. Before quitting, work with your health care provider to make a plan that can help  you cope. Preparation can help you quit and keep you from giving in. How can I cope with cravings? Cravings usually last for 5-10 minutes. If you get through it, the craving will pass. Consider taking the following actions to help you cope with cravings:  Keep your mouth busy: ? Chew sugar-free gum. ? Suck on hard candies or a straw. ? Brush your teeth.  Keep your hands and body busy: ? Immediately change to a different activity when you feel a craving. ? Squeeze or play with a ball. ? Do an activity or a hobby, like making bead jewelry, practicing needlepoint, or working with wood. ? Mix up your normal routine. ? Take a  short exercise break. Go for a quick walk or run up and down stairs. ? Spend time in public places where smoking is not allowed.  Focus on doing something kind or helpful for someone else.  Call a friend or family member to talk during a craving.  Join a support group.  Call a quit line, such as 1-800-QUIT-NOW.  Talk with your health care provider about medicines that might help you cope with cravings and make quitting easier for you. How can I deal with withdrawal symptoms? Your body may experience negative effects as it tries to get used to not having nicotine in the system. These effects are called withdrawal symptoms. They may include:  Feeling hungrier than normal.  Trouble concentrating.  Irritability.  Trouble sleeping.  Feeling depressed.  Restlessness and agitation.  Craving a cigarette. To manage withdrawal symptoms:  Avoid places, people, and activities that trigger your cravings.  Remember why you want to quit.  Get plenty of sleep.  Avoid coffee and other caffeinated drinks. These may worsen some of your symptoms. How can I handle social situations? Social situations can be difficult when you are quitting smoking, especially in the first few weeks. To manage this, you can:  Avoid parties, bars, and other social situations where people might be smoking.  Avoid alcohol.  Leave right away if you have the urge to smoke.  Explain to your family and friends that you are quitting smoking. Ask for understanding and support.  Plan activities with friends or family where smoking is not an option. What are some ways I can cope with stress? Wanting to smoke may cause stress, and stress can make you want to smoke. Find ways to manage your stress. Relaxation techniques can help. For example:  Breathe slowly and deeply, in through your nose and out through your mouth.  Listen to soothing, relaxing music.  Talk with a family member or friend about your stress.   Light a candle.  Soak in a bath or take a shower.  Think about a peaceful place. What are some ways I can prevent weight gain? Be aware that many people gain weight after they quit smoking. However, not everyone does. To keep from gaining weight, have a plan in place before you quit and stick to the plan after you quit. Your plan should include:  Having healthy snacks. When you have a craving, it may help to: ? Eat plain popcorn, crunchy carrots, celery, or other cut vegetables. ? Chew sugar-free gum.  Changing how you eat: ? Eat small portion sizes at meals. ? Eat 4-6 small meals throughout the day instead of 1-2 large meals a day. ? Be mindful when you eat. Do not watch television or do other things that might distract you as you eat.  Exercising regularly: ?  Make time to exercise each day. If you do not have time for a long workout, do short bouts of exercise for 5-10 minutes several times a day. ? Do some form of strengthening exercise, like weight lifting, and some form of aerobic exercise, like running or swimming.  Drinking plenty of water or other low-calorie or no-calorie drinks. Drink 6-8 glasses of water daily, or as much as instructed by your health care provider. Summary  Quitting smoking is a physical and mental challenge. You will face cravings, withdrawal symptoms, and temptation to smoke again. Preparation can help you as you go through these challenges.  You can cope with cravings by keeping your mouth busy (such as by chewing gum), keeping your body and hands busy, and making calls to family, friends, or a helpline for people who want to quit smoking.  You can cope with withdrawal symptoms by avoiding places where people smoke, avoiding drinks with caffeine, and getting plenty of rest.  Ask your health care provider about the different ways to prevent weight gain, avoid stress, and handle social situations. This information is not intended to replace advice given to  you by your health care provider. Make sure you discuss any questions you have with your health care provider. Document Released: 02/16/2016 Document Revised: 02/16/2016 Document Reviewed: 02/16/2016 Elsevier Interactive Patient Education  2019 [employer].

## 2018-08-27 DIAGNOSIS — H401113 Primary open-angle glaucoma, right eye, severe stage: Secondary | ICD-10-CM | POA: Diagnosis not present

## 2018-10-02 DIAGNOSIS — H401113 Primary open-angle glaucoma, right eye, severe stage: Secondary | ICD-10-CM | POA: Diagnosis not present

## 2018-10-02 DIAGNOSIS — H401121 Primary open-angle glaucoma, left eye, mild stage: Secondary | ICD-10-CM | POA: Diagnosis not present

## 2018-11-19 DIAGNOSIS — H524 Presbyopia: Secondary | ICD-10-CM | POA: Diagnosis not present

## 2019-01-18 DIAGNOSIS — Z833 Family history of diabetes mellitus: Secondary | ICD-10-CM | POA: Diagnosis not present

## 2019-01-18 DIAGNOSIS — N529 Male erectile dysfunction, unspecified: Secondary | ICD-10-CM | POA: Diagnosis not present

## 2019-01-18 DIAGNOSIS — Z8249 Family history of ischemic heart disease and other diseases of the circulatory system: Secondary | ICD-10-CM | POA: Diagnosis not present

## 2019-01-18 DIAGNOSIS — E785 Hyperlipidemia, unspecified: Secondary | ICD-10-CM | POA: Diagnosis not present

## 2019-01-18 DIAGNOSIS — I10 Essential (primary) hypertension: Secondary | ICD-10-CM | POA: Diagnosis not present

## 2019-01-18 DIAGNOSIS — Z008 Encounter for other general examination: Secondary | ICD-10-CM | POA: Diagnosis not present

## 2019-01-18 DIAGNOSIS — R69 Illness, unspecified: Secondary | ICD-10-CM | POA: Diagnosis not present

## 2019-01-18 DIAGNOSIS — Z888 Allergy status to other drugs, medicaments and biological substances status: Secondary | ICD-10-CM | POA: Diagnosis not present

## 2019-01-18 DIAGNOSIS — H409 Unspecified glaucoma: Secondary | ICD-10-CM | POA: Diagnosis not present

## 2019-03-30 DIAGNOSIS — H401113 Primary open-angle glaucoma, right eye, severe stage: Secondary | ICD-10-CM | POA: Diagnosis not present

## 2019-04-12 ENCOUNTER — Other Ambulatory Visit: Payer: Self-pay

## 2019-04-12 ENCOUNTER — Encounter (HOSPITAL_BASED_OUTPATIENT_CLINIC_OR_DEPARTMENT_OTHER): Payer: Self-pay | Admitting: *Deleted

## 2019-04-12 ENCOUNTER — Emergency Department (HOSPITAL_BASED_OUTPATIENT_CLINIC_OR_DEPARTMENT_OTHER)
Admission: EM | Admit: 2019-04-12 | Discharge: 2019-04-12 | Disposition: A | Discharge status: Home / Self Care | Payer: Medicare HMO | Attending: Emergency Medicine | Admitting: Emergency Medicine

## 2019-04-12 DIAGNOSIS — Z7982 Long term (current) use of aspirin: Secondary | ICD-10-CM | POA: Insufficient documentation

## 2019-04-12 DIAGNOSIS — I428 Other cardiomyopathies: Secondary | ICD-10-CM | POA: Diagnosis not present

## 2019-04-12 DIAGNOSIS — Z888 Allergy status to other drugs, medicaments and biological substances status: Secondary | ICD-10-CM | POA: Insufficient documentation

## 2019-04-12 DIAGNOSIS — M6281 Muscle weakness (generalized): Secondary | ICD-10-CM | POA: Insufficient documentation

## 2019-04-12 DIAGNOSIS — F17210 Nicotine dependence, cigarettes, uncomplicated: Secondary | ICD-10-CM | POA: Diagnosis not present

## 2019-04-12 DIAGNOSIS — Z79899 Other long term (current) drug therapy: Secondary | ICD-10-CM | POA: Insufficient documentation

## 2019-04-12 DIAGNOSIS — R69 Illness, unspecified: Secondary | ICD-10-CM | POA: Diagnosis not present

## 2019-04-12 DIAGNOSIS — R29898 Other symptoms and signs involving the musculoskeletal system: Secondary | ICD-10-CM

## 2019-04-12 DIAGNOSIS — I4510 Unspecified right bundle-branch block: Secondary | ICD-10-CM | POA: Insufficient documentation

## 2019-04-12 DIAGNOSIS — I10 Essential (primary) hypertension: Secondary | ICD-10-CM | POA: Insufficient documentation

## 2019-04-12 NOTE — ED Triage Notes (Signed)
69yo male presents with  Left hip / left leg pain, states he has fallen approx 6 times in the past 6 months. States "my left leg just gives out"

## 2019-04-12 NOTE — Discharge Instructions (Signed)
Your exam is normal tonight, however you need to follow-up with your regular doctor regarding your intermittent symptoms.  If you develop weakness in your arms or your legs or have difficulty walking, talking, or with balance any symptoms last for more than 10 or 15 minutes, you should call 911 and come immediately back to the hospital.

## 2019-04-12 NOTE — ED Provider Notes (Signed)
[facility] EMERGENCY DEPARTMENT Provider Note   CSN: [id number] Arrival date & time: 04/12/19  1758     History Chief Complaint  Patient presents with  . Leg Pain    Nathan Stafford is a 69 y.o. male.  Patient with history of cardiomyopathy presents the emergency department with complaint of falls.  Patient states that over the past 6 months or so he had approximately 6 falls.  2 days ago he had another fall and needed assistance to get up.  He states that his legs just get weak.  He states that he has trouble getting up.  He states that the weakness lasts for a few minutes before it gets better.  He presents tonight to be checked.  States that he cannot get in with his doctor because of Covid.  Patient denies any back or leg pain and denies warning symptoms of back pain including: fecal incontinence, urinary retention or overflow incontinence, night sweats, waking from sleep with back pain, unexplained fevers or weight loss, h/o cancer, IVDU, recent trauma.  Patient denies signs of stroke including: facial droop, slurred speech, aphasia, weakness/numbness in unilateral extremity, imbalance/trouble walking.          Past Medical History:  Diagnosis Date  . 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  Marland Kitchen GERD (gastroesophageal reflux disease)   . Glaucoma   . Hematuria   . HLD (hyperlipidemia)   . HTN (hypertension)   . Perinephric hematoma     Patient Active Problem List   Diagnosis Date Noted  . Erectile dysfunction 07/11/2016  . RBBB 07/11/2016  . 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 Abnormal EKG 07/11/2011  . HTN (hypertension) 07/11/2011  . Tobacco abuse 07/11/2011  . Dyslipidemia 07/11/2011    Past Surgical History:  Procedure Laterality Date  . colonscopy    . EYE  SURGERY Right 1993  . LASIK         Family History  Problem Relation Age of Onset  . Diabetes Father     Social History   Tobacco Use  . Smoking status: Current Every Day Smoker    Packs/day: 0.50    Years: 30.00    Pack years: 15.00    Types: Cigarettes  . Smokeless tobacco: Never Used  Substance Use Topics  . Alcohol use: Yes    Alcohol/week: 1.0 - 2.0 standard drinks    Types: 1 - 2 Cans of beer per week    Comment: occ  . Drug use: No    Home Medications Prior to Admission medications   Medication Sig Start Date End Date Taking? Authorizing Provider  amLODipine (NORVASC) 10 MG tablet Take 1 tablet (10 mg total) by mouth daily. 07/24/18   Minus Breeding, MD  aspirin 81 MG tablet Take 1 tablet (81 mg total) by mouth daily. 05/09/17   Tereasa Coop, PA-C  brimonidine (ALPHAGAN) 0.2 % ophthalmic solution Place 1 drop into both eyes 2 (two) times daily.     [provider]  dorzolamide-timolol (COSOPT) 22.3-6.8 MG/ML ophthalmic solution Place 1 drop into both eyes 2 (two) times daily.     [provider]  hydrochlorothiazide (HYDRODIURIL) 25 MG tablet Take one tablet by mouth once daily. 07/24/18   Minus Breeding, MD  metoprolol succinate (TOPROL-XL) 50 MG 24 hr tablet Take 1 tablet (50 mg  total) by mouth daily. Take with or immediately following a meal. 07/24/18   Minus Breeding, MD  simvastatin (ZOCOR) 20 MG tablet Take 1 tablet (20 mg total) by mouth at bedtime. 07/24/18   Minus Breeding, MD  sildenafil (VIAGRA) 50 MG tablet Take 1 tablet (50 mg total) by mouth daily as needed for erectile dysfunction. Patient not taking: Reported on 08/03/2018 07/11/16 04/12/19  Minus Breeding, MD    Allergies    Ace inhibitors  Review of Systems   Review of Systems  Constitutional: Negative for fever.  HENT: Negative for congestion, dental problem, rhinorrhea and sinus pressure.   Eyes: Negative for photophobia, discharge, redness and visual disturbance.    Respiratory: Negative for shortness of breath.   Cardiovascular: Negative for chest pain.  Gastrointestinal: Negative for nausea and vomiting.  Musculoskeletal: Negative for gait problem, neck pain and neck stiffness.  Skin: Negative for rash.  Neurological: Positive for weakness (Legs). Negative for syncope, speech difficulty, light-headedness, numbness and headaches.  Psychiatric/Behavioral: Negative for confusion.    Physical Exam Updated Vital Signs BP (!) 149/90 (BP Location: Right Arm)   Pulse 80   Temp 98.5 F (36.9 C) (Oral)   Resp 18   Ht 6\' 1"  (1.854 m)   Wt 90.7 kg   SpO2 99%   BMI 26.39 kg/m   Physical Exam Vitals and nursing note reviewed.  Constitutional:      Appearance: He is well-developed.  HENT:     Head: Normocephalic and atraumatic.     Right Ear: Tympanic membrane, ear canal and external ear normal.     Left Ear: Tympanic membrane, ear canal and external ear normal.     Nose: Nose normal.     Mouth/Throat:     Pharynx: Uvula midline.  Eyes:     General: Lids are normal.     Conjunctiva/sclera: Conjunctivae normal.     Pupils: Pupils are equal, round, and reactive to light.  Cardiovascular:     Rate and Rhythm: Normal rate and regular rhythm.  Pulmonary:     Effort: Pulmonary effort is normal.     Breath sounds: Normal breath sounds.  Abdominal:     Palpations: Abdomen is soft.     Tenderness: There is no abdominal tenderness.  Musculoskeletal:        General: Normal range of motion.     Cervical back: Normal range of motion and neck supple. No tenderness or bony tenderness.  Skin:    General: Skin is warm and dry.  Neurological:     Mental Status: He is alert and oriented to person, place, and time.     GCS: GCS eye subscore is 4. GCS verbal subscore is 5. GCS motor subscore is 6.     Cranial Nerves: No cranial nerve deficit.     Sensory: No sensory deficit.     Motor: No abnormal muscle tone.     Coordination: Coordination normal.      Gait: Gait normal.     Deep Tendon Reflexes: Reflexes are normal and symmetric.     ED Results / Procedures / Treatments   Labs (all labs ordered are listed, but only abnormal results are displayed) Labs Reviewed - No data to display  EKG None  Radiology No results found.  Procedures Procedures (including critical care time)  Medications Ordered in ED Medications - No data to display  ED Course  I have reviewed the triage vital signs and the nursing notes.  Pertinent labs & imaging  results that were available during my care of the patient were reviewed by me and considered in my medical decision making (see chart for details).  Patient seen and examined.  Patient has a completely normal neurological exam and has ambulated to the bathroom without any difficulties.  Patient discussed with Dr. Rogene Houston.  At this time I do not feel that he requires any further work-up.  Patient does not have any red flag signs and symptoms of back pain or for stroke.  He is not sure if he has weakness in just left leg or both legs.  Vital signs reviewed and are as follows: BP (!) 149/90 (BP Location: Right Arm)   Pulse 76   Temp 98.5 F (36.9 C) (Oral)   Resp 18   Ht 6\' 1"  (1.854 m)   Wt 90.7 kg   SpO2 99%   BMI 26.39 kg/m   Patient endorses that this weakness and falls typically occurs in the setting of heavy alcohol use.  He states that "maybe I should think about cutting back on drinking".  Discussed with patient that if he were to have symptoms that persist for more than 10 minutes, he needs to call 911.  Otherwise, follow-up with his PCP for further evaluation.     MDM Rules/Calculators/A&P                       Final Clinical Impression(s) / ED Diagnoses Final diagnoses:  Weakness of both lower extremities    Rx / DC Orders ED Discharge Orders    None       Carlisle Cater, Hershal Coria 04/12/19 2032    Fredia Sorrow, MD 04/12/19 2047

## 2019-04-30 ENCOUNTER — Telehealth: Payer: Self-pay | Admitting: Cardiology

## 2019-04-30 DIAGNOSIS — I429 Cardiomyopathy, unspecified: Secondary | ICD-10-CM

## 2019-04-30 NOTE — Telephone Encounter (Signed)
Pt c/o medication issue:  1. Name of Medication: sildenafil(VIAGRA)50MG  tablet  2. How are you currently taking this medication (dosage and times per day)? As needed  3. Are you having a reaction (difficulty breathing--STAT)? no  4. What is your medication issue? Patient is requesting a new prescription for the medication.

## 2019-05-01 NOTE — Telephone Encounter (Signed)
OK to refill x 1 

## 2019-05-03 MED ORDER — SILDENAFIL CITRATE 50 MG PO TABS: 50.0000 mg | ORAL_TABLET | Freq: Every day | ORAL | 0 refills | Status: DC | PRN

## 2019-05-03 NOTE — Telephone Encounter (Signed)
Sildenafil refilled x1 per Dr. Percival Spanish.

## 2019-08-15 ENCOUNTER — Other Ambulatory Visit: Payer: Self-pay | Admitting: Cardiology

## 2019-08-15 DIAGNOSIS — E782 Mixed hyperlipidemia: Secondary | ICD-10-CM

## 2019-09-04 ENCOUNTER — Encounter (HOSPITAL_BASED_OUTPATIENT_CLINIC_OR_DEPARTMENT_OTHER): Payer: Self-pay | Admitting: Emergency Medicine

## 2019-09-04 ENCOUNTER — Other Ambulatory Visit: Payer: Self-pay

## 2019-09-04 ENCOUNTER — Emergency Department (HOSPITAL_BASED_OUTPATIENT_CLINIC_OR_DEPARTMENT_OTHER): Payer: Medicare HMO

## 2019-09-04 ENCOUNTER — Emergency Department (HOSPITAL_BASED_OUTPATIENT_CLINIC_OR_DEPARTMENT_OTHER)
Admission: EM | Admit: 2019-09-04 | Discharge: 2019-09-04 | Disposition: A | Discharge status: Home / Self Care | Payer: Medicare HMO | Attending: Emergency Medicine | Admitting: Emergency Medicine

## 2019-09-04 DIAGNOSIS — I10 Essential (primary) hypertension: Secondary | ICD-10-CM | POA: Diagnosis not present

## 2019-09-04 DIAGNOSIS — F17210 Nicotine dependence, cigarettes, uncomplicated: Secondary | ICD-10-CM | POA: Insufficient documentation

## 2019-09-04 DIAGNOSIS — Y999 Unspecified external cause status: Secondary | ICD-10-CM | POA: Insufficient documentation

## 2019-09-04 DIAGNOSIS — Z7982 Long term (current) use of aspirin: Secondary | ICD-10-CM | POA: Insufficient documentation

## 2019-09-04 DIAGNOSIS — M47892 Other spondylosis, cervical region: Secondary | ICD-10-CM | POA: Diagnosis not present

## 2019-09-04 DIAGNOSIS — M542 Cervicalgia: Secondary | ICD-10-CM | POA: Diagnosis not present

## 2019-09-04 DIAGNOSIS — M549 Dorsalgia, unspecified: Secondary | ICD-10-CM | POA: Diagnosis not present

## 2019-09-04 DIAGNOSIS — Y92410 Unspecified street and highway as the place of occurrence of the external cause: Secondary | ICD-10-CM | POA: Insufficient documentation

## 2019-09-04 DIAGNOSIS — S161XXA Strain of muscle, fascia and tendon at neck level, initial encounter: Secondary | ICD-10-CM | POA: Diagnosis not present

## 2019-09-04 DIAGNOSIS — M47812 Spondylosis without myelopathy or radiculopathy, cervical region: Secondary | ICD-10-CM

## 2019-09-04 DIAGNOSIS — Z79899 Other long term (current) drug therapy: Secondary | ICD-10-CM | POA: Insufficient documentation

## 2019-09-04 DIAGNOSIS — Y939 Activity, unspecified: Secondary | ICD-10-CM | POA: Diagnosis not present

## 2019-09-04 DIAGNOSIS — M47814 Spondylosis without myelopathy or radiculopathy, thoracic region: Secondary | ICD-10-CM

## 2019-09-04 DIAGNOSIS — M47816 Spondylosis without myelopathy or radiculopathy, lumbar region: Secondary | ICD-10-CM

## 2019-09-04 DIAGNOSIS — M546 Pain in thoracic spine: Secondary | ICD-10-CM | POA: Diagnosis not present

## 2019-09-04 DIAGNOSIS — R69 Illness, unspecified: Secondary | ICD-10-CM | POA: Diagnosis not present

## 2019-09-04 DIAGNOSIS — M545 Low back pain: Secondary | ICD-10-CM | POA: Diagnosis not present

## 2019-09-04 DIAGNOSIS — S199XXA Unspecified injury of neck, initial encounter: Secondary | ICD-10-CM | POA: Diagnosis present

## 2019-09-04 DIAGNOSIS — M47896 Other spondylosis, lumbar region: Secondary | ICD-10-CM | POA: Diagnosis not present

## 2019-09-04 MED ORDER — CYCLOBENZAPRINE HCL 10 MG PO TABS: 10.0000 mg | ORAL_TABLET | Freq: Two times a day (BID) | ORAL | 0 refills | Status: DC | PRN

## 2019-09-04 MED ORDER — LIDOCAINE 5 % EX PTCH: 1.0000 | MEDICATED_PATCH | CUTANEOUS | 0 refills | Status: DC
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
## 2019-09-21 ENCOUNTER — Other Ambulatory Visit: Payer: Self-pay

## 2019-09-21 ENCOUNTER — Encounter: Payer: Self-pay | Admitting: Emergency Medicine

## 2019-09-21 ENCOUNTER — Ambulatory Visit (INDEPENDENT_AMBULATORY_CARE_PROVIDER_SITE_OTHER): Payer: Medicare HMO | Admitting: Emergency Medicine

## 2019-09-21 VITALS — BP 139/75 | HR 74 | Temp 97.7°F | Ht 73.0 in | Wt 201.0 lb

## 2019-09-21 DIAGNOSIS — S39012D Strain of muscle, fascia and tendon of lower back, subsequent encounter: Secondary | ICD-10-CM | POA: Diagnosis not present

## 2019-09-21 DIAGNOSIS — S300XXD Contusion of lower back and pelvis, subsequent encounter: Secondary | ICD-10-CM

## 2019-09-21 DIAGNOSIS — M549 Dorsalgia, unspecified: Secondary | ICD-10-CM | POA: Diagnosis not present
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
## 2019-09-21 NOTE — Patient Instructions (Addendum)
Continue over-the-counter analgesics, Tylenol and/or Advil as needed. Follow-up here if no better or worse during the next several weeks.    If you have lab work done today you will be contacted with your lab results within the next 2 weeks.  If you have not heard from [place] then please contact us. The fastest way to get your results is to register for My Chart.   IF you received an x-ray today, you will receive an invoice from [facility]. Please contact [facility] at [phone] with questions or concerns regarding your invoice.   IF you received labwork today, you will receive an invoice from [facility]. Please contact [facility] at [phone] with questions or concerns regarding your invoice.   Our billing staff will not be able to assist you with questions regarding bills from these companies.  You will be contacted with the lab results as soon as they are available. The fastest way to get your results is to activate your My Chart account. Instructions are located on the last page of this paperwork. If you have not heard from [place] regarding the results in 2 weeks, please contact this office.      Acute Back Pain, Adult Acute back pain is sudden and usually short-lived. It is often caused by an injury to the muscles and tissues in the back. The injury may result from:  A muscle or ligament getting overstretched or torn (strained). Ligaments are tissues that connect bones to each other. Lifting something improperly can cause a back strain.  Wear and tear (degeneration) of the spinal disks. Spinal disks are circular tissue that provides cushioning between the bones of the spine (vertebrae).  Twisting motions, such as while playing sports or doing yard work.  A hit to the back.  Arthritis. You may have a physical exam, lab tests, and imaging tests to find the cause of your pain. Acute back pain usually goes away with rest and home care. Follow these instructions at  home: Managing pain, stiffness, and swelling  Take over-the-counter and prescription medicines only as told by your health care provider.  Your health care provider may recommend applying ice during the first 24-48 hours after your pain starts. To do this: ? Put ice in a plastic bag. ? Place a towel between your skin and the bag. ? Leave the ice on for 20 minutes, 2-3 times a day.  If directed, apply heat to the affected area as often as told by your health care provider. Use the heat source that your health care provider recommends, such as a moist heat pack or a heating pad. ? Place a towel between your skin and the heat source. ? Leave the heat on for 20-30 minutes. ? Remove the heat if your skin turns bright red. This is especially important if you are unable to feel pain, heat, or cold. You have a greater risk of getting burned. Activity   Do not stay in bed. Staying in bed for more than 1-2 days can delay your recovery.  Sit up and stand up straight. Avoid leaning forward when you sit, or hunching over when you stand. ? If you work at a desk, sit close to it so you do not need to lean over. Keep your chin tucked in. Keep your neck drawn back, and keep your elbows bent at a right angle. Your arms should look like the letter "L." ? Sit high and close to the steering wheel when you drive. Add lower back (lumbar)  support to your car seat, if needed.  Take short walks on even surfaces as soon as you are able. Try to increase the length of time you walk each day.  Do not sit, drive, or stand in one place for more than 30 minutes at a time. Sitting or standing for long periods of time can put stress on your back.  Do not drive or use heavy machinery while taking prescription pain medicine.  Use proper lifting techniques. When you bend and lift, use positions that put less stress on your back: ? Liborio Negrin Torres your knees. ? Keep the load close to your body. ? Avoid twisting.  Exercise regularly  as told by your health care provider. Exercising helps your back heal faster and helps prevent back injuries by keeping muscles strong and flexible.  Work with a physical therapist to make a safe exercise program, as recommended by your health care provider. Do any exercises as told by your physical therapist. Lifestyle  Maintain a healthy weight. Extra weight puts stress on your back and makes it difficult to have good posture.  Avoid activities or situations that make you feel anxious or stressed. Stress and anxiety increase muscle tension and can make back pain worse. Learn ways to manage anxiety and stress, such as through exercise. General instructions  Sleep on a firm mattress in a comfortable position. Try lying on your side with your knees slightly bent. If you lie on your back, put a pillow under your knees.  Follow your treatment plan as told by your health care provider. This may include: ? Cognitive or behavioral therapy. ? Acupuncture or massage therapy. ? Meditation or yoga. Contact a health care provider if:  You have pain that is not relieved with rest or medicine.  You have increasing pain going down into your legs or buttocks.  Your pain does not improve after 2 weeks.  You have pain at night.  You lose weight without trying.  You have a fever or chills. Get help right away if:  You develop new bowel or bladder control problems.  You have unusual weakness or numbness in your arms or legs.  You develop nausea or vomiting.  You develop abdominal pain.  You feel faint. Summary  Acute back pain is sudden and usually short-lived.  Use proper lifting techniques. When you bend and lift, use positions that put less stress on your back.  Take over-the-counter and prescription medicines and apply heat or ice as directed by your health care provider. This information is not intended to replace advice given to you by your health care provider. Make sure you  discuss any questions you have with your health care provider. Document Revised: 06/09/2018 Document Reviewed: 10/02/2016 Elsevier Patient Education  [place].

## 2019-09-24 ENCOUNTER — Other Ambulatory Visit: Payer: Self-pay | Admitting: Cardiology

## 2019-09-24 DIAGNOSIS — I10 Essential (primary) hypertension: Secondary | ICD-10-CM

## 2019-09-29 ENCOUNTER — Other Ambulatory Visit: Payer: Self-pay | Admitting: Cardiology

## 2019-09-29 DIAGNOSIS — I10 Essential (primary) hypertension: Secondary | ICD-10-CM

## 2019-10-01 ENCOUNTER — Telehealth: Payer: Self-pay | Admitting: Emergency Medicine

## 2019-10-01 ENCOUNTER — Other Ambulatory Visit: Payer: Self-pay | Admitting: Cardiology

## 2019-10-01 ENCOUNTER — Other Ambulatory Visit: Payer: Self-pay

## 2019-10-01 DIAGNOSIS — I10 Essential (primary) hypertension: Secondary | ICD-10-CM

## 2019-10-01 MED ORDER — AMLODIPINE BESYLATE 10 MG PO TABS: 10.0000 mg | ORAL_TABLET | Freq: Every day | ORAL | 3 refills | Status: DC

## 2019-10-01 MED ORDER — HYDROCHLOROTHIAZIDE 25 MG PO TABS: ORAL_TABLET | ORAL | 3 refills | Status: DC

## 2019-10-01 NOTE — Telephone Encounter (Signed)
Medication sent and pt has been notified.

## 2019-10-01 NOTE — Telephone Encounter (Signed)
Pt called in requesting bp medication. [facility] [address]. Pt last seen on 09/21/19

## 2019-10-09 DIAGNOSIS — Z833 Family history of diabetes mellitus: Secondary | ICD-10-CM | POA: Diagnosis not present

## 2019-10-09 DIAGNOSIS — E785 Hyperlipidemia, unspecified: Secondary | ICD-10-CM | POA: Diagnosis not present

## 2019-10-09 DIAGNOSIS — H409 Unspecified glaucoma: Secondary | ICD-10-CM | POA: Diagnosis not present

## 2019-10-09 DIAGNOSIS — R69 Illness, unspecified: Secondary | ICD-10-CM | POA: Diagnosis not present

## 2019-10-09 DIAGNOSIS — I10 Essential (primary) hypertension: Secondary | ICD-10-CM | POA: Diagnosis not present

## 2019-11-19 DIAGNOSIS — H5213 Myopia, bilateral: Secondary | ICD-10-CM | POA: Diagnosis not present

## 2019-12-13 ENCOUNTER — Ambulatory Visit (INDEPENDENT_AMBULATORY_CARE_PROVIDER_SITE_OTHER): Payer: Medicare HMO | Admitting: Emergency Medicine

## 2019-12-13 VITALS — BP 139/75 | Ht 73.0 in | Wt 201.0 lb

## 2019-12-13 DIAGNOSIS — Z0000 Encounter for general adult medical examination without abnormal findings: Secondary | ICD-10-CM

## 2019-12-13 NOTE — Progress Notes (Signed)
Presents today for [employer] Visit   Date of last exam: 09/21/2019  Interpreter used for this visit? No  I connected with  Nathan Stafford on 12/13/19 by a telephone enabled  and verified that I am speaking with the correct person using two identifiers.   I discussed the limitations of evaluation and management by telemedicine. The patient expressed understanding and agreed to proceed.  Patient location: home  Provider location: in office  I provided 20 minutes of non face - to - face time during this encounter.  Patient Care Team: Horald Pollen, MD as PCP - General (Internal Medicine) Minus Breeding, MD as PCP - Cardiology (Cardiology)   Other items to address today:   Discussed Immunizations Discussed Eye/Dental   Other Screening: Last screening for diabetes: 05/09/2017 Last lipid screening:05/09/2017  ADVANCE DIRECTIVES: Discussed: yes On File: no Materials Provided: yes  Immunization status:  Immunization History  Administered Date(s) Administered  . Influenza-Unspecified 12/07/2019  . PFIZER SARS-COV-2 Vaccination 05/28/2019, 06/18/2019  . Pneumococcal Conjugate-13 04/26/2015     Health Maintenance Due  Topic Date Due  . TETANUS/TDAP  Never done  . PNA vac Low Risk Adult (2 of 2 - PPSV23) 04/25/2016     Functional Status Survey: Is the patient deaf or have difficulty hearing?: No Does the patient have difficulty seeing, even when wearing glasses/contacts?: No Does the patient have difficulty concentrating, remembering, or making decisions?: No Does the patient have difficulty walking or climbing stairs?: No Does the patient have difficulty dressing or bathing?: No Does the patient have difficulty doing errands alone such as visiting a doctor's office or shopping?: No   6CIT Screen 12/13/2019 08/03/2018  What Year? 0 points 0 points  What month? 0 points 0 points  What time? 0 points 0 points  Count back from 20 0 points 0  points  Months in reverse 0 points 0 points  Repeat phrase 0 points 0 points  Total Score 0 0        Clinical Support from 12/13/2019 in Primary Care at [facility]  AUDIT-C Score 0       Home Environment:   No trouble trouble climbing stairs No scattered rugs  yes grab bars Adequate lighting/ no clutter Lives in two story home  Works Part-time     Patient Active Problem List   Diagnosis Date Noted  . Erectile dysfunction 07/11/2016  . RBBB 07/11/2016  . 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 Abnormal EKG 07/11/2011  . HTN (hypertension) 07/11/2011  . Tobacco abuse 07/11/2011  . Dyslipidemia 07/11/2011     Past Medical History:  Diagnosis Date  . Cardiomyopathy secondary    likely HTN cardiomyopathy;  Echocardiogram was obtained 07/18/11: Moderate LVH, EF 54-27%, grade 1 diastolic dysfunction, mild MR, mild RAE.;  cardiac cath in 2008 with normal coronary arteries.  Marland Kitchen GERD (gastroesophageal reflux disease)   . Glaucoma   . Hematuria   . HLD (hyperlipidemia)   . HTN (hypertension)   . Perinephric hematoma      Past Surgical History:  Procedure Laterality Date  . colonscopy    . EYE SURGERY Right 1993  . LASIK       Family History  Problem Relation Age of Onset  . Diabetes Father      Social History   Socioeconomic History  . Marital status: Married    Spouse name: Not on file  . Number of children: 1  . Years of education: Not on file  .  Highest education level: Not on file  Occupational History  . Occupation: Receiving [employer]    Employer: [employer]  Tobacco Use  . Smoking status: Current Every Day Smoker    Packs/day: 0.50    Years: 30.00    Pack years: 15.00    Types: Cigarettes  . Smokeless tobacco: Never Used  Substance and Sexual Activity  . Alcohol use: Yes    Alcohol/week: 1.0 - 2.0 standard drink    Types: 1 - 2 Cans of beer per week    Comment: occ  . Drug use: No  . Sexual activity: Yes  Other Topics Concern  . Not on  file  Social History Narrative   Lives with his wife.   Social Determinants of Health   Financial Resource Strain:   . Difficulty of Paying Living Expenses: Not on file  Food Insecurity:   . Worried About Charity fundraiser in the Last Year: Not on file  . Ran Out of Food in the Last Year: Not on file  Transportation Needs:   . Lack of Transportation (Medical): Not on file  . Lack of Transportation (Non-Medical): Not on file  Physical Activity:   . Days of Exercise per Week: Not on file  . Minutes of Exercise per Session: Not on file  Stress:   . Feeling of Stress : Not on file  Social Connections:   . Frequency of Communication with Friends and Family: Not on file  . Frequency of Social Gatherings with Friends and Family: Not on file  . Attends Religious Services: Not on file  . Active Member of Clubs or Organizations: Not on file  . Attends Archivist Meetings: Not on file  . Marital Status: Not on file  Intimate Partner Violence:   . Fear of Current or Ex-Partner: Not on file  . Emotionally Abused: Not on file  . Physically Abused: Not on file  . Sexually Abused: Not on file     Allergies  Allergen Reactions  . Ace Inhibitors Swelling     Prior to Admission medications   Medication Sig Start Date End Date Taking? Authorizing Provider  amLODipine (NORVASC) 10 MG tablet Take 1 tablet (10 mg total) by mouth daily. 10/01/19  Yes SagardiaInes Bloomer, MD  aspirin 81 MG tablet Take 1 tablet (81 mg total) by mouth daily. 05/09/17  Yes Tereasa Coop, PA-C  brimonidine (ALPHAGAN) 0.2 % ophthalmic solution Place 1 drop into both eyes 2 (two) times daily.    Yes [provider]  dorzolamide-timolol (COSOPT) 22.3-6.8 MG/ML ophthalmic solution Place 1 drop into both eyes 2 (two) times daily.    Yes [provider]  hydrochlorothiazide (HYDRODIURIL) 25 MG tablet Take one tablet by mouth once daily. 10/01/19  Yes Sagardia, Ines Bloomer, MD  metoprolol  succinate (TOPROL-XL) 50 MG 24 hr tablet TAKE 1 TABLET BY MOUTH ONCE DAILY WITH MEALS OR  IMMEDIATELY  FOLLOWING 09/24/19  Yes Minus Breeding, MD  simvastatin (ZOCOR) 20 MG tablet TAKE 1 TABLET BY MOUTH AT BEDTIME 08/16/19  Yes Minus Breeding, MD  cyclobenzaprine (FLEXERIL) 10 MG tablet Take 1 tablet (10 mg total) by mouth 2 (two) times daily as needed for muscle spasms. Patient not taking: Reported on 12/13/2019 09/04/19   Gareth Morgan, MD  lidocaine (LIDODERM) 5 % Place 1 patch onto the skin daily. Remove & Discard patch within 12 hours or as directed by MD Patient not taking: Reported on 09/21/2019 09/04/19   Gareth Morgan, MD  sildenafil (VIAGRA) 50 MG tablet Take 1 tablet (50 mg total) by mouth daily as needed for erectile dysfunction. Patient not taking: Reported on 09/21/2019 05/03/19   Minus Breeding, MD     Depression screen [facility] 2/9 12/13/2019 08/03/2018 05/29/2018 09/26/2017 08/14/2017  Decreased Interest 0 0 0 0 0  Down, Depressed, Hopeless 0 0 0 0 0  PHQ - 2 Score 0 0 0 0 0     Fall Risk  12/13/2019 09/21/2019 08/03/2018 05/29/2018 09/26/2017  Falls in the past year? 1 0 0 0 No  Number falls in past yr: 0 0 0 0 -  Comment not sure why fell - - - -  Injury with Fall? 0 0 0 0 -  Follow up Falls evaluation completed;Education provided Falls evaluation completed - - -      PHYSICAL EXAM: BP 139/75 Comment: taken from a previous visit/not in clinic  Ht 6\' 1"  (1.854 m)   Wt 201 lb (91.2 kg)   BMI 26.52 kg/m    Wt Readings from Last 3 Encounters:  12/13/19 201 lb (91.2 kg)  09/21/19 201 lb (91.2 kg)  04/12/19 200 lb (90.7 kg)      Education/Counseling provided regarding diet and exercise, prevention of chronic diseases, smoking/tobacco cessation, if applicable, and reviewed "Covered Medicare Preventive Services."

## 2019-12-13 NOTE — Patient Instructions (Addendum)
Thank you for taking time to come for your Medicare Wellness Visit. I appreciate your ongoing commitment to your health goals. Please review the following plan we discussed and let me know if I can assist you in the future.  Gredmarie Delange LPN  Preventive Care 70 Years and Older, Male Preventive care refers to lifestyle choices and visits with your health care provider that can promote health and wellness. This includes:  A yearly physical exam. This is also called an annual well check.  Regular dental and eye exams.  Immunizations.  Screening for certain conditions.  Healthy lifestyle choices, such as diet and exercise. What can I expect for my preventive care visit? Physical exam Your health care provider will check:  Height and weight. These may be used to calculate body mass index (BMI), which is a measurement that tells if you are at a healthy weight.  Heart rate and blood pressure.  Your skin for abnormal spots. Counseling Your health care provider may ask you questions about:  Alcohol, tobacco, and drug use.  Emotional well-being.  Home and relationship well-being.  Sexual activity.  Eating habits.  History of falls.  Memory and ability to understand (cognition).  Work and work environment. What immunizations do I need?  Influenza (flu) vaccine  This is recommended every year. Tetanus, diphtheria, and pertussis (Tdap) vaccine  You may need a Td booster every 10 years. Varicella (chickenpox) vaccine  You may need this vaccine if you have not already been vaccinated. Zoster (shingles) vaccine  You may need this after age 60. Pneumococcal conjugate (PCV13) vaccine  One dose is recommended after age 70. Pneumococcal polysaccharide (PPSV23) vaccine  One dose is recommended after age 70. Measles, mumps, and rubella (MMR) vaccine  You may need at least one dose of MMR if you were born in 1957 or later. You may also need a second dose. Meningococcal  conjugate (MenACWY) vaccine  You may need this if you have certain conditions. Hepatitis A vaccine  You may need this if you have certain conditions or if you travel or work in places where you may be exposed to hepatitis A. Hepatitis B vaccine  You may need this if you have certain conditions or if you travel or work in places where you may be exposed to hepatitis B. Haemophilus influenzae type b (Hib) vaccine  You may need this if you have certain conditions. You may receive vaccines as individual doses or as more than one vaccine together in one shot (combination vaccines). Talk with your health care provider about the risks and benefits of combination vaccines. What tests do I need? Blood tests  Lipid and cholesterol levels. These may be checked every 5 years, or more frequently depending on your overall health.  Hepatitis C test.  Hepatitis B test. Screening  Lung cancer screening. You may have this screening every year starting at age 55 if you have a 30-pack-year history of smoking and currently smoke or have quit within the past 15 years.  Colorectal cancer screening. All adults should have this screening starting at age 50 and continuing until age 75. Your health care provider may recommend screening at age 45 if you are at increased risk. You will have tests every 1-10 years, depending on your results and the type of screening test.  Prostate cancer screening. Recommendations will vary depending on your family history and other risks.  Diabetes screening. This is done by checking your blood sugar (glucose) after you have not eaten for   a while (fasting). You may have this done every 1-3 years.  Abdominal aortic aneurysm (AAA) screening. You may need this if you are a current or former smoker.  Sexually transmitted disease (STD) testing. Follow these instructions at home: Eating and drinking  Eat a diet that includes fresh fruits and vegetables, whole grains, lean  protein, and low-fat dairy products. Limit your intake of foods with high amounts of sugar, saturated fats, and salt.  Take vitamin and mineral supplements as recommended by your health care provider.  Do not drink alcohol if your health care provider tells you not to drink.  If you drink alcohol: ? Limit how much you have to 0-2 drinks a day. ? Be aware of how much alcohol is in your drink. In the [place] one drink equals one 12 oz bottle of beer (355 mL), one 5 oz glass of wine (148 mL), or one 1 oz glass of hard liquor (44 mL). Lifestyle  Take daily care of your teeth and gums.  Stay active. Exercise for at least 30 minutes on 5 or more days each week.  Do not use any products that contain nicotine or tobacco, such as cigarettes, e-cigarettes, and chewing tobacco. If you need help quitting, ask your health care provider.  If you are sexually active, practice safe sex. Use a condom or other form of protection to prevent STIs (sexually transmitted infections).  Talk with your health care provider about taking a low-dose aspirin or statin. What's next?  Visit your health care provider once a year for a well check visit.  Ask your health care provider how often you should have your eyes and teeth checked.  Stay up to date on all vaccines. This information is not intended to replace advice given to you by your health care provider. Make sure you discuss any questions you have with your health care provider. Document Revised: 02/12/2018 Document Reviewed: 02/12/2018 Elsevier Patient Education  2020 [employer].

## 2019-12-23 ENCOUNTER — Other Ambulatory Visit: Payer: Self-pay | Admitting: Cardiology

## 2019-12-23 DIAGNOSIS — I10 Essential (primary) hypertension: Secondary | ICD-10-CM

## 2020-01-22 ENCOUNTER — Other Ambulatory Visit: Payer: Self-pay | Admitting: Cardiology

## 2020-01-22 DIAGNOSIS — I10 Essential (primary) hypertension: Secondary | ICD-10-CM

## 2020-03-08 ENCOUNTER — Telehealth (INDEPENDENT_AMBULATORY_CARE_PROVIDER_SITE_OTHER): Payer: Medicare HMO | Admitting: Emergency Medicine

## 2020-03-08 ENCOUNTER — Other Ambulatory Visit: Payer: Self-pay

## 2020-03-08 ENCOUNTER — Encounter: Payer: Self-pay | Admitting: Emergency Medicine

## 2020-03-08 DIAGNOSIS — I429 Cardiomyopathy, unspecified: Secondary | ICD-10-CM | POA: Diagnosis not present

## 2020-03-08 DIAGNOSIS — I10 Essential (primary) hypertension: Secondary | ICD-10-CM

## 2020-03-08 MED ORDER — METOPROLOL SUCCINATE ER 50 MG PO TB24: 50.0000 mg | ORAL_TABLET | Freq: Every day | ORAL | 3 refills | Status: DC

## 2020-03-08 MED ORDER — SILDENAFIL CITRATE 50 MG PO TABS: 50.0000 mg | ORAL_TABLET | Freq: Every day | ORAL | 3 refills | Status: DC | PRN

## 2020-03-08 NOTE — Progress Notes (Signed)
Telemedicine Encounter- SOAP NOTE Established Patient Patient: Home  Provider: Office     This telephone encounter was conducted with the patient's (or proxy's) verbal consent via audio telecommunications: yes/no: Yes Patient was instructed to have this encounter in a suitably private space; and to only have persons present to whom they give permission to participate. In addition, patient identity was confirmed by use of name plus two identifiers (DOB and address).  I discussed the limitations, risks, security and privacy concerns of performing an evaluation and management service by telephone and the availability of in person appointments. I also discussed with the patient that there may be a patient responsible charge related to this service. The patient expressed understanding and agreed to proceed.  I spent a total of TIME; 0 MIN TO 60 MIN: 20 minutes talking with the patient or their proxy.  Chief Complaint  Patient presents with  . Dizziness    Per patient he thinks it is from drinking alcohol too much stopped July 17 of last year, dizziness happens at night when he turns over in bed.    Subjective   Nathan Stafford is a 70 y.o. male established patient. Telephone visit today complaining of short lived episodes of dizziness, "swimmers head", on and off since last summer but much improved now.  Episodes last about 5 seconds and not associated with any other symptoms.  Denies syncope.  Stopped drinking last summer and stopped smoking 3 weeks ago.  Dizziness gets worse with turning head and episodes were much worse when he was drinking alcohol.  No other complaints or medical concerns today.  HPI   Patient Active Problem List   Diagnosis Date Noted  . Erectile dysfunction 07/11/2016  . RBBB 07/11/2016  . 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 Abnormal EKG 07/11/2011  . HTN (hypertension) 07/11/2011  . Tobacco abuse 07/11/2011  . Dyslipidemia 07/11/2011    Past  Medical History:  Diagnosis Date  . Cardiomyopathy secondary    likely HTN cardiomyopathy;  Echocardiogram was obtained 07/18/11: Moderate LVH, EF 40-45%, grade 1 diastolic dysfunction, mild MR, mild RAE.;  cardiac cath in 2008 with normal coronary arteries.  Marland Kitchen GERD (gastroesophageal reflux disease)   . Glaucoma   . Hematuria   . HLD (hyperlipidemia)   . HTN (hypertension)   . Perinephric hematoma     Current Outpatient Medications  Medication Sig Dispense Refill  . amLODipine (NORVASC) 10 MG tablet Take 1 tablet (10 mg total) by mouth daily. 90 tablet 3  . aspirin 81 MG tablet Take 1 tablet (81 mg total) by mouth daily. 90 tablet 3  . brimonidine (ALPHAGAN) 0.2 % ophthalmic solution Place 1 drop into both eyes 2 (two) times daily.     . dorzolamide-timolol (COSOPT) 22.3-6.8 MG/ML ophthalmic solution Place 1 drop into both eyes 2 (two) times daily.     . hydrochlorothiazide (HYDRODIURIL) 25 MG tablet Take one tablet by mouth once daily. 90 tablet 3  . metoprolol succinate (TOPROL-XL) 50 MG 24 hr tablet TAKE 1 TABLET BY MOUTH ONCE DAILY WITH MEALS OR  IMMEDIATELY  FOLLOWING  (NEED  APPT  FOR  FURTHER  REFILLS  1ST  ATTEMPT) 30 tablet 1  . Omega-3 Fatty Acids (FISH OIL PO) Take by mouth as needed.    . simvastatin (ZOCOR) 20 MG tablet TAKE 1 TABLET BY MOUTH AT BEDTIME 90 tablet 1  . cyclobenzaprine (FLEXERIL) 10 MG tablet Take 1 tablet (10 mg total) by mouth 2 (two) times daily  as needed for muscle spasms. (Patient not taking: Reported on 03/08/2020) 20 tablet 0  . lidocaine (LIDODERM) 5 % Place 1 patch onto the skin daily. Remove & Discard patch within 12 hours or as directed by MD (Patient not taking: Reported on 03/08/2020) 10 patch 0  . sildenafil (VIAGRA) 50 MG tablet Take 1 tablet (50 mg total) by mouth daily as needed for erectile dysfunction. (Patient not taking: Reported on 03/08/2020) 30 tablet 0   No current facility-administered medications for this visit.    Allergies  Allergen  Reactions  . Ace Inhibitors Swelling    Social History   Socioeconomic History  . Marital status: Married    Spouse name: Not on file  . Number of children: 1  . Years of education: Not on file  . Highest education level: Not on file  Occupational History  . Occupation: Receiving [employer]    Employer: [employer]  Tobacco Use  . Smoking status: Current Every Day Smoker    Packs/day: 0.50    Years: 30.00    Pack years: 15.00    Types: Cigarettes  . Smokeless tobacco: Never Used  Substance and Sexual Activity  . Alcohol use: Yes    Alcohol/week: 1.0 - 2.0 standard drink    Types: 1 - 2 Cans of beer per week    Comment: occ  . Drug use: No  . Sexual activity: Yes  Other Topics Concern  . Not on file  Social History Narrative   Lives with his wife.   Social Determinants of Health   Financial Resource Strain: Not on file  Food Insecurity: Not on file  Transportation Needs: Not on file  Physical Activity: Not on file  Stress: Not on file  Social Connections: Not on file  Intimate Partner Violence: Not on file    ROS  Objective  Alert and oriented x3 in no apparent respiratory distress. Vitals as reported by the patient: Today's Vitals   03/08/20 1438  Weight: 202 lb (91.6 kg)  Height: 6\' 2"  (1.88 m)    Nathan Stafford was seen today for dizziness.  Diagnoses and all orders for this visit:  Essential hypertension  Cardiomyopathy, unspecified type (HCC)  Clinically stable.  No medical concerns identified during this visit. Continue present medications.  No changes. We will schedule office visit sometime in the next 2 to 3 months.   I discussed the assessment and treatment plan with the patient. The patient was provided an opportunity to ask questions and all were answered. The patient agreed with the plan and demonstrated an understanding of the instructions.   The patient was advised to call back or seek an in-person evaluation if the symptoms worsen or if the condition  fails to improve as anticipated.  I provided 20 minutes of non-face-to-face time during this encounter.  , MD  [facility]

## 2020-03-17 DIAGNOSIS — H401133 Primary open-angle glaucoma, bilateral, severe stage: Secondary | ICD-10-CM | POA: Diagnosis not present

## 2020-05-11 ENCOUNTER — Encounter: Payer: Self-pay | Admitting: Emergency Medicine

## 2020-05-11 ENCOUNTER — Ambulatory Visit (INDEPENDENT_AMBULATORY_CARE_PROVIDER_SITE_OTHER): Payer: Medicare HMO | Admitting: Emergency Medicine

## 2020-05-11 ENCOUNTER — Other Ambulatory Visit: Payer: Self-pay

## 2020-05-11 ENCOUNTER — Other Ambulatory Visit: Payer: Self-pay | Admitting: Cardiology

## 2020-05-11 VITALS — BP 138/72 | HR 62 | Temp 97.6°F | Resp 16 | Ht 73.0 in | Wt 205.0 lb

## 2020-05-11 DIAGNOSIS — I429 Cardiomyopathy, unspecified: Secondary | ICD-10-CM

## 2020-05-11 DIAGNOSIS — I10 Essential (primary) hypertension: Secondary | ICD-10-CM | POA: Diagnosis not present

## 2020-05-11 DIAGNOSIS — E782 Mixed hyperlipidemia: Secondary | ICD-10-CM

## 2020-05-11 DIAGNOSIS — I5042 Chronic combined systolic (congestive) and diastolic (congestive) heart failure: Secondary | ICD-10-CM | POA: Diagnosis not present

## 2020-05-11 DIAGNOSIS — F17200 Nicotine dependence, unspecified, uncomplicated: Secondary | ICD-10-CM

## 2020-05-11 DIAGNOSIS — I110 Hypertensive heart disease with heart failure: Secondary | ICD-10-CM | POA: Insufficient documentation

## 2020-05-11 DIAGNOSIS — E785 Hyperlipidemia, unspecified: Secondary | ICD-10-CM | POA: Diagnosis not present

## 2020-05-11 DIAGNOSIS — R69 Illness, unspecified: Secondary | ICD-10-CM | POA: Diagnosis not present

## 2020-05-11 DIAGNOSIS — R7303 Prediabetes: Secondary | ICD-10-CM

## 2020-05-11 LAB — COMPREHENSIVE METABOLIC PANEL
ALT: 9 IU/L (ref 0–44)
AST: 16 IU/L (ref 0–40)
Albumin/Globulin Ratio: 1.7 (ref 1.2–2.2)
Albumin: 4.8 g/dL (ref 3.8–4.8)
Alkaline Phosphatase: 88 IU/L (ref 44–121)
BUN/Creatinine Ratio: 10 (ref 10–24)
BUN: 9 mg/dL (ref 8–27)
Bilirubin Total: 0.4 mg/dL (ref 0.0–1.2)
CO2: 24 mmol/L (ref 20–29)
Calcium: 9.9 mg/dL (ref 8.6–10.2)
Chloride: 103 mmol/L (ref 96–106)
Creatinine, Ser: 0.91 mg/dL (ref 0.76–1.27)
Globulin, Total: 2.8 g/dL (ref 1.5–4.5)
Glucose: 90 mg/dL (ref 65–99)
Potassium: 4.1 mmol/L (ref 3.5–5.2)
Sodium: 142 mmol/L (ref 134–144)
Total Protein: 7.6 g/dL (ref 6.0–8.5)
eGFR: 91 mL/min/{1.73_m2} (ref >59)

## 2020-05-11 LAB — LIPID PANEL
Chol/HDL Ratio: 3.9 ratio (ref 0.0–5.0)
Cholesterol, Total: 128 mg/dL (ref 100–199)
HDL: 33 mg/dL — ABNORMAL LOW (ref >39)
LDL Chol Calc (NIH): 79 mg/dL (ref 0–99)
Triglycerides: 83 mg/dL (ref 0–149)
VLDL Cholesterol Cal: 16 mg/dL (ref 5–40)

## 2020-05-11 LAB — HEMOGLOBIN A1C
Est. average glucose Bld gHb Est-mCnc: 131 mg/dL
Hgb A1c MFr Bld: 6.2 % — ABNORMAL HIGH (ref 4.8–5.6)

## 2020-05-11 MED ORDER — SIMVASTATIN 20 MG PO TABS: 20.0000 mg | ORAL_TABLET | Freq: Every day | ORAL | 3 refills | Status: DC

## 2020-05-11 NOTE — Patient Instructions (Addendum)
   If you have lab work done today you will be contacted with your lab results within the next 2 weeks.  If you have not heard from us then please contact us. The fastest way to get your results is to register for My Chart.   IF you received an x-ray today, you will receive an invoice from [facility]. Please contact [facility] at [phone] with questions or concerns regarding your invoice.   IF you received labwork today, you will receive an invoice from [facility]. Please contact [facility] at [phone] with questions or concerns regarding your invoice.   Our billing staff will not be able to assist you with questions regarding bills from these companies.  You will be contacted with the lab results as soon as they are available. The fastest way to get your results is to activate your My Chart account. Instructions are located on the last page of this paperwork. If you have not heard from us regarding the results in 2 weeks, please contact this office.     Health Maintenance After Age 65 After age 65, you are at a higher risk for certain long-term diseases and infections as well as injuries from falls. Falls are a major cause of broken bones and head injuries in people who are older than age 65. Getting regular preventive care can help to keep you healthy and well. Preventive care includes getting regular testing and making lifestyle changes as recommended by your health care provider. Talk with your health care provider about:  Which screenings and tests you should have. A screening is a test that checks for a disease when you have no symptoms.  A diet and exercise plan that is right for you. What should I know about screenings and tests to prevent falls? Screening and testing are the best ways to find a health problem early. Early diagnosis and treatment give you the best chance of managing medical conditions that are common after age 65. Certain conditions and  lifestyle choices may make you more likely to have a fall. Your health care provider may recommend:  Regular vision checks. Poor vision and conditions such as cataracts can make you more likely to have a fall. If you wear glasses, make sure to get your prescription updated if your vision changes.  Medicine review. Work with your health care provider to regularly review all of the medicines you are taking, including over-the-counter medicines. Ask your health care provider about any side effects that may make you more likely to have a fall. Tell your health care provider if any medicines that you take make you feel dizzy or sleepy.  Osteoporosis screening. Osteoporosis is a condition that causes the bones to get weaker. This can make the bones weak and cause them to break more easily.  Blood pressure screening. Blood pressure changes and medicines to control blood pressure can make you feel dizzy.  Strength and balance checks. Your health care provider may recommend certain tests to check your strength and balance while standing, walking, or changing positions.  Foot health exam. Foot pain and numbness, as well as not wearing proper footwear, can make you more likely to have a fall.  Depression screening. You may be more likely to have a fall if you have a fear of falling, feel emotionally low, or feel unable to do activities that you used to do.  Alcohol use screening. Using too much alcohol can affect your balance and may make you more likely to   have a fall. What actions can I take to lower my risk of falls? General instructions  Talk with your health care provider about your risks for falling. Tell your health care provider if: ? You fall. Be sure to tell your health care provider about all falls, even ones that seem minor. ? You feel dizzy, sleepy, or off-balance.  Take over-the-counter and prescription medicines only as told by your health care provider. These include any  supplements.  Eat a healthy diet and maintain a healthy weight. A healthy diet includes low-fat dairy products, low-fat (lean) meats, and fiber from whole grains, beans, and lots of fruits and vegetables. Home safety  Remove any tripping hazards, such as rugs, cords, and clutter.  Install safety equipment such as grab bars in bathrooms and safety rails on stairs.  Keep rooms and walkways well-lit. Activity  Follow a regular exercise program to stay fit. This will help you maintain your balance. Ask your health care provider what types of exercise are appropriate for you.  If you need a cane or walker, use it as recommended by your health care provider.  Wear supportive shoes that have nonskid soles.   Lifestyle  Do not drink alcohol if your health care provider tells you not to drink.  If you drink alcohol, limit how much you have: ? 0-1 drink a day for women. ? 0-2 drinks a day for men.  Be aware of how much alcohol is in your drink. In the [place] one drink equals one typical bottle of beer (12 oz), one-half glass of wine (5 oz), or one shot of hard liquor (1 oz).  Do not use any products that contain nicotine or tobacco, such as cigarettes and e-cigarettes. If you need help quitting, ask your health care provider. Summary  Having a healthy lifestyle and getting preventive care can help to protect your health and wellness after age 34.  Screening and testing are the best way to find a health problem early and help you avoid having a fall. Early diagnosis and treatment give you the best chance for managing medical conditions that are more common for people who are older than age 61.  Falls are a major cause of broken bones and head injuries in people who are older than age 84. Take precautions to prevent a fall at home.  Work with your health care provider to learn what changes you can make to improve your health and wellness and to prevent falls. This information is not intended  to replace advice given to you by your health care provider. Make sure you discuss any questions you have with your health care provider. Document Revised: 06/11/2018 Document Reviewed: 01/01/2017 Elsevier Patient Education  2021 [place].  Hypertension, Adult High blood pressure (hypertension) is when the force of blood pumping through the arteries is too strong. The arteries are the blood vessels that carry blood from the heart throughout the body. Hypertension forces the heart to work harder to pump blood and may cause arteries to become narrow or stiff. Untreated or uncontrolled hypertension can cause a heart attack, heart failure, a stroke, kidney disease, and other problems. A blood pressure reading consists of a higher number over a lower number. Ideally, your blood pressure should be below 120/80. The first ("top") number is called the systolic pressure. It is a measure of the pressure in your arteries as your heart beats. The second ("bottom") number is called the diastolic pressure. It is a measure of the pressure  in your arteries as the heart relaxes. What are the causes? The exact cause of this condition is not known. There are some conditions that result in or are related to high blood pressure. What increases the risk? Some risk factors for high blood pressure are under your control. The following factors may make you more likely to develop this condition:  Smoking.  Having type 2 diabetes mellitus, high cholesterol, or both.  Not getting enough exercise or physical activity.  Being overweight.  Having too much fat, sugar, calories, or salt (sodium) in your diet.  Drinking too much alcohol. Some risk factors for high blood pressure may be difficult or impossible to change. Some of these factors include:  Having chronic kidney disease.  Having a family history of high blood pressure.  Age. Risk increases with age.  Race. You may be at higher risk if you are African  American.  Gender. Men are at higher risk than women before age 76. After age 79, women are at higher risk than men.  Having obstructive sleep apnea.  Stress. What are the signs or symptoms? High blood pressure may not cause symptoms. Very high blood pressure (hypertensive crisis) may cause:  Headache.  Anxiety.  Shortness of breath.  Nosebleed.  Nausea and vomiting.  Vision changes.  Severe chest pain.  Seizures. How is this diagnosed? This condition is diagnosed by measuring your blood pressure while you are seated, with your arm resting on a flat surface, your legs uncrossed, and your feet flat on the floor. The cuff of the blood pressure monitor will be placed directly against the skin of your upper arm at the level of your heart. It should be measured at least twice using the same arm. Certain conditions can cause a difference in blood pressure between your right and left arms. Certain factors can cause blood pressure readings to be lower or higher than normal for a short period of time:  When your blood pressure is higher when you are in a health care provider's office than when you are at home, this is called white coat hypertension. Most people with this condition do not need medicines.  When your blood pressure is higher at home than when you are in a health care provider's office, this is called masked hypertension. Most people with this condition may need medicines to control blood pressure. If you have a high blood pressure reading during one visit or you have normal blood pressure with other risk factors, you may be asked to:  Return on a different day to have your blood pressure checked again.  Monitor your blood pressure at home for 1 week or longer. If you are diagnosed with hypertension, you may have other blood or imaging tests to help your health care provider understand your overall risk for other conditions. How is this treated? This condition is treated by  making healthy lifestyle changes, such as eating healthy foods, exercising more, and reducing your alcohol intake. Your health care provider may prescribe medicine if lifestyle changes are not enough to get your blood pressure under control, and if:  Your systolic blood pressure is above 130.  Your diastolic blood pressure is above 80. Your personal target blood pressure may vary depending on your medical conditions, your age, and other factors. Follow these instructions at home: Eating and drinking  Eat a diet that is high in fiber and potassium, and low in sodium, added sugar, and fat. An example eating plan is called the DASH (  Dietary Approaches to Stop Hypertension) diet. To eat this way: ? Eat plenty of fresh fruits and vegetables. Try to fill one half of your plate at each meal with fruits and vegetables. ? Eat whole grains, such as whole-wheat pasta, brown rice, or whole-grain bread. Fill about one fourth of your plate with whole grains. ? Eat or drink low-fat dairy products, such as skim milk or low-fat yogurt. ? Avoid fatty cuts of meat, processed or cured meats, and poultry with skin. Fill about one fourth of your plate with lean proteins, such as fish, chicken without skin, beans, eggs, or tofu. ? Avoid pre-made and processed foods. These tend to be higher in sodium, added sugar, and fat.  Reduce your daily sodium intake. Most people with hypertension should eat less than 1,500 mg of sodium a day.  Do not drink alcohol if: ? Your health care provider tells you not to drink. ? You are pregnant, may be pregnant, or are planning to become pregnant.  If you drink alcohol: ? Limit how much you use to:  0-1 drink a day for women.  0-2 drinks a day for men. ? Be aware of how much alcohol is in your drink. In the [place] one drink equals one 12 oz bottle of beer (355 mL), one 5 oz glass of wine (148 mL), or one 1 oz glass of hard liquor (44 mL).   Lifestyle  Work with your health  care provider to maintain a healthy body weight or to lose weight. Ask what an ideal weight is for you.  Get at least 30 minutes of exercise most days of the week. Activities may include walking, swimming, or biking.  Include exercise to strengthen your muscles (resistance exercise), such as Pilates or lifting weights, as part of your weekly exercise routine. Try to do these types of exercises for 30 minutes at least 3 days a week.  Do not use any products that contain nicotine or tobacco, such as cigarettes, e-cigarettes, and chewing tobacco. If you need help quitting, ask your health care provider.  Monitor your blood pressure at home as told by your health care provider.  Keep all follow-up visits as told by your health care provider. This is important.   Medicines  Take over-the-counter and prescription medicines only as told by your health care provider. Follow directions carefully. Blood pressure medicines must be taken as prescribed.  Do not skip doses of blood pressure medicine. Doing this puts you at risk for problems and can make the medicine less effective.  Ask your health care provider about side effects or reactions to medicines that you should watch for. Contact a health care provider if you:  Think you are having a reaction to a medicine you are taking.  Have headaches that keep coming back (recurring).  Feel dizzy.  Have swelling in your ankles.  Have trouble with your vision. Get help right away if you:  Develop a severe headache or confusion.  Have unusual weakness or numbness.  Feel faint.  Have severe pain in your chest or abdomen.  Vomit repeatedly.  Have trouble breathing. Summary  Hypertension is when the force of blood pumping through your arteries is too strong. If this condition is not controlled, it may put you at risk for serious complications.  Your personal target blood pressure may vary depending on your medical conditions, your age, and  other factors. For most people, a normal blood pressure is less than 120/80.  Hypertension is treated with  lifestyle changes, medicines, or a combination of both. Lifestyle changes include losing weight, eating a healthy, low-sodium diet, exercising more, and limiting alcohol. This information is not intended to replace advice given to you by your health care provider. Make sure you discuss any questions you have with your health care provider. Document Revised: 10/29/2017 Document Reviewed: 10/29/2017 Elsevier Patient Education  2021 [employer].

## 2020-05-11 NOTE — Progress Notes (Signed)
Nathan Stafford 70 y.o.   Chief Complaint  Patient presents with  . Hypertension    Follow up 2 month    HISTORY OF PRESENT ILLNESS: This is a 70 y.o. male with history of hypertension and hypertensive heart disease here for follow-up and medication refill. Doing well.  Has no complaints or medical concerns today. Not drinking any alcohol and smoking much less.  HPI   Prior to Admission medications   Medication Sig Start Date End Date Taking? Authorizing Provider  amLODipine (NORVASC) 10 MG tablet Take 1 tablet (10 mg total) by mouth daily. 10/01/19   Horald Pollen, MD  aspirin 81 MG tablet Take 1 tablet (81 mg total) by mouth daily. 05/09/17   Tereasa Coop, PA-C  brimonidine (ALPHAGAN) 0.2 % ophthalmic solution Place 1 drop into both eyes 2 (two) times daily.     [provider]  dorzolamide-timolol (COSOPT) 22.3-6.8 MG/ML ophthalmic solution Place 1 drop into both eyes 2 (two) times daily.     [provider]  hydrochlorothiazide (HYDRODIURIL) 25 MG tablet Take one tablet by mouth once daily. 10/01/19   Horald Pollen, MD  lidocaine (LIDODERM) 5 % Place 1 patch onto the skin daily. Remove & Discard patch within 12 hours or as directed by MD Patient not taking: Reported on 03/08/2020 09/04/19   Gareth Morgan, MD  metoprolol succinate (TOPROL-XL) 50 MG 24 hr tablet Take 1 tablet (50 mg total) by mouth daily. Take with or immediately following a meal. 03/08/20 06/06/20  Horald Pollen, MD  Omega-3 Fatty Acids (FISH OIL PO) Take by mouth as needed.    [provider]  sildenafil (VIAGRA) 50 MG tablet Take 1 tablet (50 mg total) by mouth daily as needed for erectile dysfunction. 03/08/20   Horald Pollen, MD  simvastatin (ZOCOR) 20 MG tablet TAKE 1 TABLET BY MOUTH AT BEDTIME 08/16/19   Minus Breeding, MD    Allergies  Allergen Reactions  . Ace Inhibitors Swelling    Patient Active Problem List   Diagnosis Date Noted  . Erectile  dysfunction 07/11/2016  . RBBB 07/11/2016  . 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 Abnormal EKG 07/11/2011  . HTN (hypertension) 07/11/2011  . Tobacco abuse 07/11/2011  . Dyslipidemia 07/11/2011    Past Medical History:  Diagnosis Date  . Cardiomyopathy secondary    likely HTN cardiomyopathy;  Echocardiogram was obtained 07/18/11: Moderate LVH, EF 32-12%, grade 1 diastolic dysfunction, mild MR, mild RAE.;  cardiac cath in 2008 with normal coronary arteries.  Marland Kitchen GERD (gastroesophageal reflux disease)   . Glaucoma   . Hematuria   . HLD (hyperlipidemia)   . HTN (hypertension)   . Perinephric hematoma     Past Surgical History:  Procedure Laterality Date  . colonscopy    . EYE SURGERY Right 1993  . LASIK      Social History   Socioeconomic History  . Marital status: Married    Spouse name: Not on file  . Number of children: 1  . Years of education: Not on file  . Highest education level: Not on file  Occupational History  . Occupation: Receiving [employer]    Employer: [employer]  Tobacco Use  . Smoking status: Current Every Day Smoker    Packs/day: 0.50    Years: 30.00    Pack years: 15.00    Types: Cigarettes  . Smokeless tobacco: Never Used  Substance and Sexual Activity  . Alcohol use: Yes    Alcohol/week: 1.0 -  2.0 standard drink    Types: 1 - 2 Cans of beer per week    Comment: occ  . Drug use: No  . Sexual activity: Yes  Other Topics Concern  . Not on file  Social History Narrative   Lives with his wife.   Social Determinants of Health   Financial Resource Strain: Not on file  Food Insecurity: Not on file  Transportation Needs: Not on file  Physical Activity: Not on file  Stress: Not on file  Social Connections: Not on file  Intimate Partner Violence: Not on file    Family History  Problem Relation Age of Onset  . Diabetes Father      Review of Systems  Constitutional: Negative.  Negative for chills and fever.  HENT: Positive for hearing  loss (Right ear). Negative for congestion and sore throat.   Respiratory: Negative.  Negative for cough and shortness of breath.   Cardiovascular: Negative.  Negative for chest pain and palpitations.  Gastrointestinal: Negative.  Negative for abdominal pain, blood in stool, diarrhea, melena, nausea and vomiting.  Genitourinary: Negative.  Negative for dysuria and hematuria.  Musculoskeletal: Negative.  Negative for back pain, myalgias and neck pain.  Skin: Negative.  Negative for rash.  Neurological: Positive for dizziness (Occasional bouts of dizziness). Negative for headaches.  All other systems reviewed and are negative.  Today's Vitals   05/11/20 0936  BP: (!) 152/72  Pulse: 62  Resp: 16  Temp: 97.6 F (36.4 C)  TempSrc: Temporal  SpO2: 99%  Weight: 205 lb (93 kg)  Height: 6\' 1"  (1.854 m)   Body mass index is 27.05 kg/m. Wt Readings from Last 3 Encounters:  05/11/20 205 lb (93 kg)  03/08/20 202 lb (91.6 kg)  12/13/19 201 lb (91.2 kg)     Physical Exam Vitals reviewed.  Constitutional:      Appearance: Normal appearance.  HENT:     Head: Normocephalic.     Right Ear: There is impacted cerumen.     Left Ear: Tympanic membrane, ear canal and external ear normal.  Eyes:     Extraocular Movements: Extraocular movements intact.     Conjunctiva/sclera: Conjunctivae normal.     Pupils: Pupils are equal, round, and reactive to light.  Cardiovascular:     Rate and Rhythm: Regular rhythm.     Pulses: Normal pulses.     Heart sounds: Normal heart sounds.  Pulmonary:     Effort: Pulmonary effort is normal.     Breath sounds: Normal breath sounds.  Musculoskeletal:        General: Normal range of motion.     Cervical back: Normal range of motion and neck supple.  Skin:    General: Skin is warm and dry.     Capillary Refill: Capillary refill takes less than 2 seconds.  Neurological:     General: No focal deficit present.     Mental Status: He is alert and oriented to  person, place, and time.  Psychiatric:        Mood and Affect: Mood normal.        Behavior: Behavior normal.    A total of 30 minutes was spent with the patient, greater than 50% of which was in counseling/coordination of care regarding hypertension and cardiovascular risks associated with this condition, review of all medications, smoking cessation advice and risks associated with smoking, education on nutrition, review of most recent office visit notes, review of most recent blood work results, health maintenance  items, prognosis, documentation and need for follow-up.   ASSESSMENT & PLAN: Clinically stable.  No medical concerns identified during this visit. Continue present medications.  No changes. Smoking cessation advice given. Follow-up in 6 months. Nathan Stafford was seen today for hypertension.  Diagnoses and all orders for this visit:  Hypertensive heart disease with chronic combined systolic and diastolic congestive heart failure (HCC) -     Lipid panel -     Comprehensive metabolic panel  Cardiomyopathy, unspecified type (HCC)  Essential hypertension  Prediabetes -     Hemoglobin A1c  Dyslipidemia -     simvastatin (ZOCOR) 20 MG tablet; Take 1 tablet (20 mg total) by mouth at bedtime.  Current smoker    Patient Instructions       If you have lab work done today you will be contacted with your lab results within the next 2 weeks.  If you have not heard from [place] then please contact us. The fastest way to get your results is to register for My Chart.   IF you received an x-ray today, you will receive an invoice from [facility]. Please contact [facility] at [phone] with questions or concerns regarding your invoice.   IF you received labwork today, you will receive an invoice from [place]. Please contact [facility] at [phone] with questions or concerns regarding your invoice.   Our billing staff will not be able to assist you with questions  regarding bills from these companies.  You will be contacted with the lab results as soon as they are available. The fastest way to get your results is to activate your My Chart account. Instructions are located on the last page of this paperwork. If you have not heard from [place] regarding the results in 2 weeks, please contact this office.     Health Maintenance After Age 67 After age 52, you are at a higher risk for certain long-term diseases and infections as well as injuries from falls. Falls are a major cause of broken bones and head injuries in people who are older than age 19. Getting regular preventive care can help to keep you healthy and well. Preventive care includes getting regular testing and making lifestyle changes as recommended by your health care provider. Talk with your health care provider about:  Which screenings and tests you should have. A screening is a test that checks for a disease when you have no symptoms.  A diet and exercise plan that is right for you. What should I know about screenings and tests to prevent falls? Screening and testing are the best ways to find a health problem early. Early diagnosis and treatment give you the best chance of managing medical conditions that are common after age 37. Certain conditions and lifestyle choices may make you more likely to have a fall. Your health care provider may recommend:  Regular vision checks. Poor vision and conditions such as cataracts can make you more likely to have a fall. If you wear glasses, make sure to get your prescription updated if your vision changes.  Medicine review. Work with your health care provider to regularly review all of the medicines you are taking, including over-the-counter medicines. Ask your health care provider about any side effects that may make you more likely to have a fall. Tell your health care provider if any medicines that you take make you feel dizzy or sleepy.  Osteoporosis  screening. Osteoporosis is a condition that causes the bones to get weaker. This can  make the bones weak and cause them to break more easily.  Blood pressure screening. Blood pressure changes and medicines to control blood pressure can make you feel dizzy.  Strength and balance checks. Your health care provider may recommend certain tests to check your strength and balance while standing, walking, or changing positions.  Foot health exam. Foot pain and numbness, as well as not wearing proper footwear, can make you more likely to have a fall.  Depression screening. You may be more likely to have a fall if you have a fear of falling, feel emotionally low, or feel unable to do activities that you used to do.  Alcohol use screening. Using too much alcohol can affect your balance and may make you more likely to have a fall. What actions can I take to lower my risk of falls? General instructions  Talk with your health care provider about your risks for falling. Tell your health care provider if: ? You fall. Be sure to tell your health care provider about all falls, even ones that seem minor. ? You feel dizzy, sleepy, or off-balance.  Take over-the-counter and prescription medicines only as told by your health care provider. These include any supplements.  Eat a healthy diet and maintain a healthy weight. A healthy diet includes low-fat dairy products, low-fat (lean) meats, and fiber from whole grains, beans, and lots of fruits and vegetables. Home safety  Remove any tripping hazards, such as rugs, cords, and clutter.  Install safety equipment such as grab bars in bathrooms and safety rails on stairs.  Keep rooms and walkways well-lit. Activity  Follow a regular exercise program to stay fit. This will help you maintain your balance. Ask your health care provider what types of exercise are appropriate for you.  If you need a cane or walker, use it as recommended by your health care  provider.  Wear supportive shoes that have nonskid soles.   Lifestyle  Do not drink alcohol if your health care provider tells you not to drink.  If you drink alcohol, limit how much you have: ? 0-1 drink a day for women. ? 0-2 drinks a day for men.  Be aware of how much alcohol is in your drink. In the [place] one drink equals one typical bottle of beer (12 oz), one-half glass of wine (5 oz), or one shot of hard liquor (1 oz).  Do not use any products that contain nicotine or tobacco, such as cigarettes and e-cigarettes. If you need help quitting, ask your health care provider. Summary  Having a healthy lifestyle and getting preventive care can help to protect your health and wellness after age 12.  Screening and testing are the best way to find a health problem early and help you avoid having a fall. Early diagnosis and treatment give you the best chance for managing medical conditions that are more common for people who are older than age 78.  Falls are a major cause of broken bones and head injuries in people who are older than age 65. Take precautions to prevent a fall at home.  Work with your health care provider to learn what changes you can make to improve your health and wellness and to prevent falls. This information is not intended to replace advice given to you by your health care provider. Make sure you discuss any questions you have with your health care provider. Document Revised: 06/11/2018 Document Reviewed: 01/01/2017 Elsevier Patient Education  2021 [employer].  Hypertension, Adult  High blood pressure (hypertension) is when the force of blood pumping through the arteries is too strong. The arteries are the blood vessels that carry blood from the heart throughout the body. Hypertension forces the heart to work harder to pump blood and may cause arteries to become narrow or stiff. Untreated or uncontrolled hypertension can cause a heart attack, heart failure, a stroke,  kidney disease, and other problems. A blood pressure reading consists of a higher number over a lower number. Ideally, your blood pressure should be below 120/80. The first ("top") number is called the systolic pressure. It is a measure of the pressure in your arteries as your heart beats. The second ("bottom") number is called the diastolic pressure. It is a measure of the pressure in your arteries as the heart relaxes. What are the causes? The exact cause of this condition is not known. There are some conditions that result in or are related to high blood pressure. What increases the risk? Some risk factors for high blood pressure are under your control. The following factors may make you more likely to develop this condition:  Smoking.  Having type 2 diabetes mellitus, high cholesterol, or both.  Not getting enough exercise or physical activity.  Being overweight.  Having too much fat, sugar, calories, or salt (sodium) in your diet.  Drinking too much alcohol. Some risk factors for high blood pressure may be difficult or impossible to change. Some of these factors include:  Having chronic kidney disease.  Having a family history of high blood pressure.  Age. Risk increases with age.  Race. You may be at higher risk if you are African American.  Gender. Men are at higher risk than women before age 47. After age 93, women are at higher risk than men.  Having obstructive sleep apnea.  Stress. What are the signs or symptoms? High blood pressure may not cause symptoms. Very high blood pressure (hypertensive crisis) may cause:  Headache.  Anxiety.  Shortness of breath.  Nosebleed.  Nausea and vomiting.  Vision changes.  Severe chest pain.  Seizures. How is this diagnosed? This condition is diagnosed by measuring your blood pressure while you are seated, with your arm resting on a flat surface, your legs uncrossed, and your feet flat on the floor. The cuff of the blood  pressure monitor will be placed directly against the skin of your upper arm at the level of your heart. It should be measured at least twice using the same arm. Certain conditions can cause a difference in blood pressure between your right and left arms. Certain factors can cause blood pressure readings to be lower or higher than normal for a short period of time:  When your blood pressure is higher when you are in a health care provider's office than when you are at home, this is called white coat hypertension. Most people with this condition do not need medicines.  When your blood pressure is higher at home than when you are in a health care provider's office, this is called masked hypertension. Most people with this condition may need medicines to control blood pressure. If you have a high blood pressure reading during one visit or you have normal blood pressure with other risk factors, you may be asked to:  Return on a different day to have your blood pressure checked again.  Monitor your blood pressure at home for 1 week or longer. If you are diagnosed with hypertension, you may have other blood or imaging  tests to help your health care provider understand your overall risk for other conditions. How is this treated? This condition is treated by making healthy lifestyle changes, such as eating healthy foods, exercising more, and reducing your alcohol intake. Your health care provider may prescribe medicine if lifestyle changes are not enough to get your blood pressure under control, and if:  Your systolic blood pressure is above 130.  Your diastolic blood pressure is above 80. Your personal target blood pressure may vary depending on your medical conditions, your age, and other factors. Follow these instructions at home: Eating and drinking  Eat a diet that is high in fiber and potassium, and low in sodium, added sugar, and fat. An example eating plan is called the DASH (Dietary Approaches  to Stop Hypertension) diet. To eat this way: ? Eat plenty of fresh fruits and vegetables. Try to fill one half of your plate at each meal with fruits and vegetables. ? Eat whole grains, such as whole-wheat pasta, brown rice, or whole-grain bread. Fill about one fourth of your plate with whole grains. ? Eat or drink low-fat dairy products, such as skim milk or low-fat yogurt. ? Avoid fatty cuts of meat, processed or cured meats, and poultry with skin. Fill about one fourth of your plate with lean proteins, such as fish, chicken without skin, beans, eggs, or tofu. ? Avoid pre-made and processed foods. These tend to be higher in sodium, added sugar, and fat.  Reduce your daily sodium intake. Most people with hypertension should eat less than 1,500 mg of sodium a day.  Do not drink alcohol if: ? Your health care provider tells you not to drink. ? You are pregnant, may be pregnant, or are planning to become pregnant.  If you drink alcohol: ? Limit how much you use to:  0-1 drink a day for women.  0-2 drinks a day for men. ? Be aware of how much alcohol is in your drink. In the [place] one drink equals one 12 oz bottle of beer (355 mL), one 5 oz glass of wine (148 mL), or one 1 oz glass of hard liquor (44 mL).   Lifestyle  Work with your health care provider to maintain a healthy body weight or to lose weight. Ask what an ideal weight is for you.  Get at least 30 minutes of exercise most days of the week. Activities may include walking, swimming, or biking.  Include exercise to strengthen your muscles (resistance exercise), such as Pilates or lifting weights, as part of your weekly exercise routine. Try to do these types of exercises for 30 minutes at least 3 days a week.  Do not use any products that contain nicotine or tobacco, such as cigarettes, e-cigarettes, and chewing tobacco. If you need help quitting, ask your health care provider.  Monitor your blood pressure at home as told by your  health care provider.  Keep all follow-up visits as told by your health care provider. This is important.   Medicines  Take over-the-counter and prescription medicines only as told by your health care provider. Follow directions carefully. Blood pressure medicines must be taken as prescribed.  Do not skip doses of blood pressure medicine. Doing this puts you at risk for problems and can make the medicine less effective.  Ask your health care provider about side effects or reactions to medicines that you should watch for. Contact a health care provider if you:  Think you are having a reaction to a medicine  you are taking.  Have headaches that keep coming back (recurring).  Feel dizzy.  Have swelling in your ankles.  Have trouble with your vision. Get help right away if you:  Develop a severe headache or confusion.  Have unusual weakness or numbness.  Feel faint.  Have severe pain in your chest or abdomen.  Vomit repeatedly.  Have trouble breathing. Summary  Hypertension is when the force of blood pumping through your arteries is too strong. If this condition is not controlled, it may put you at risk for serious complications.  Your personal target blood pressure may vary depending on your medical conditions, your age, and other factors. For most people, a normal blood pressure is less than 120/80.  Hypertension is treated with lifestyle changes, medicines, or a combination of both. Lifestyle changes include losing weight, eating a healthy, low-sodium diet, exercising more, and limiting alcohol. This information is not intended to replace advice given to you by your health care provider. Make sure you discuss any questions you have with your health care provider. Document Revised: 10/29/2017 Document Reviewed: 10/29/2017 Elsevier Patient Education  2021 Elsevier Inc.     Agustina Caroli, MD [facility]

## 2020-05-31 DIAGNOSIS — Z7982 Long term (current) use of aspirin: Secondary | ICD-10-CM | POA: Diagnosis not present

## 2020-05-31 DIAGNOSIS — N529 Male erectile dysfunction, unspecified: Secondary | ICD-10-CM | POA: Diagnosis not present

## 2020-05-31 DIAGNOSIS — R69 Illness, unspecified: Secondary | ICD-10-CM | POA: Diagnosis not present

## 2020-05-31 DIAGNOSIS — E785 Hyperlipidemia, unspecified: Secondary | ICD-10-CM | POA: Diagnosis not present

## 2020-05-31 DIAGNOSIS — Z833 Family history of diabetes mellitus: Secondary | ICD-10-CM | POA: Diagnosis not present

## 2020-05-31 DIAGNOSIS — Z008 Encounter for other general examination: Secondary | ICD-10-CM | POA: Diagnosis not present

## 2020-05-31 DIAGNOSIS — H409 Unspecified glaucoma: Secondary | ICD-10-CM | POA: Diagnosis not present

## 2020-05-31 DIAGNOSIS — I10 Essential (primary) hypertension: Secondary | ICD-10-CM | POA: Diagnosis not present
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
## 2020-07-27 DIAGNOSIS — H401133 Primary open-angle glaucoma, bilateral, severe stage: Secondary | ICD-10-CM | POA: Diagnosis not present

## 2020-10-13 ENCOUNTER — Other Ambulatory Visit: Payer: Self-pay | Admitting: Emergency Medicine

## 2020-10-13 DIAGNOSIS — I10 Essential (primary) hypertension: Secondary | ICD-10-CM

## 2020-11-13 ENCOUNTER — Other Ambulatory Visit: Payer: Self-pay

## 2020-11-13 ENCOUNTER — Encounter: Payer: Self-pay | Admitting: Internal Medicine

## 2020-11-13 ENCOUNTER — Ambulatory Visit: Payer: Medicare HMO | Admitting: Emergency Medicine

## 2020-11-13 ENCOUNTER — Ambulatory Visit (INDEPENDENT_AMBULATORY_CARE_PROVIDER_SITE_OTHER): Payer: Medicare HMO | Admitting: Internal Medicine

## 2020-11-13 VITALS — BP 146/82 | HR 84 | Temp 98.7°F | Resp 16 | Ht 73.0 in | Wt 196.0 lb

## 2020-11-13 DIAGNOSIS — I110 Hypertensive heart disease with heart failure: Secondary | ICD-10-CM

## 2020-11-13 DIAGNOSIS — H6121 Impacted cerumen, right ear: Secondary | ICD-10-CM | POA: Diagnosis not present

## 2020-11-13 DIAGNOSIS — N400 Enlarged prostate without lower urinary tract symptoms: Secondary | ICD-10-CM

## 2020-11-13 DIAGNOSIS — Z0001 Encounter for general adult medical examination with abnormal findings: Secondary | ICD-10-CM | POA: Diagnosis not present

## 2020-11-13 DIAGNOSIS — I429 Cardiomyopathy, unspecified: Secondary | ICD-10-CM | POA: Diagnosis not present

## 2020-11-13 DIAGNOSIS — R7303 Prediabetes: Secondary | ICD-10-CM | POA: Diagnosis not present

## 2020-11-13 DIAGNOSIS — E785 Hyperlipidemia, unspecified: Secondary | ICD-10-CM | POA: Diagnosis not present

## 2020-11-13 DIAGNOSIS — I5042 Chronic combined systolic (congestive) and diastolic (congestive) heart failure: Secondary | ICD-10-CM | POA: Diagnosis not present

## 2020-11-13 DIAGNOSIS — I10 Essential (primary) hypertension: Secondary | ICD-10-CM | POA: Diagnosis not present

## 2020-11-13 DIAGNOSIS — R69 Illness, unspecified: Secondary | ICD-10-CM | POA: Diagnosis not present

## 2020-11-13 DIAGNOSIS — F17200 Nicotine dependence, unspecified, uncomplicated: Secondary | ICD-10-CM

## 2020-11-13 DIAGNOSIS — I4510 Unspecified right bundle-branch block: Secondary | ICD-10-CM

## 2020-11-13 DIAGNOSIS — Z23 Encounter for immunization: Secondary | ICD-10-CM

## 2020-11-13 LAB — URINALYSIS, ROUTINE W REFLEX MICROSCOPIC
Bilirubin Urine: NEGATIVE
Hgb urine dipstick: NEGATIVE
Ketones, ur: NEGATIVE
Leukocytes,Ua: NEGATIVE
Nitrite: NEGATIVE
RBC / HPF: NONE SEEN (ref 0–?)
Specific Gravity, Urine: 1.02 (ref 1.000–1.030)
Urine Glucose: NEGATIVE
Urobilinogen, UA: 0.2 (ref 0.0–1.0)
pH: 6 (ref 5.0–8.0)

## 2020-11-13 LAB — BASIC METABOLIC PANEL
BUN: 15 mg/dL (ref 6–23)
CO2: 29 mEq/L (ref 19–32)
Calcium: 10.3 mg/dL (ref 8.4–10.5)
Chloride: 100 mEq/L (ref 96–112)
Creatinine, Ser: 0.82 mg/dL (ref 0.40–1.50)
GFR: 88.42 mL/min (ref >60.00)
Glucose, Bld: 86 mg/dL (ref 70–99)
Potassium: 4 mEq/L (ref 3.5–5.1)
Sodium: 139 mEq/L (ref 135–145)

## 2020-11-13 LAB — TSH: TSH: 1.4 u[IU]/mL (ref 0.35–5.50)

## 2020-11-13 LAB — PSA: PSA: 1.88 ng/mL (ref 0.10–4.00)

## 2020-11-13 LAB — HEMOGLOBIN A1C: Hgb A1c MFr Bld: 5.9 % (ref 4.6–6.5)

## 2020-11-13 MED ORDER — BOOSTRIX 5-2.5-18.5 LF-MCG/0.5 IM SUSP: 0.5000 mL | Freq: Once | INTRAMUSCULAR | 0 refills | Status: AC

## 2020-11-13 MED ORDER — SIMVASTATIN 20 MG PO TABS: 20.0000 mg | ORAL_TABLET | Freq: Every day | ORAL | 1 refills | Status: DC

## 2020-11-13 MED ORDER — AMLODIPINE BESYLATE 10 MG PO TABS: 10.0000 mg | ORAL_TABLET | Freq: Every day | ORAL | 0 refills | Status: DC
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
## 2020-11-13 NOTE — Patient Instructions (Signed)
Health Maintenance, Male Adopting a healthy lifestyle and getting preventive care are important in promoting health and wellness. Ask your health care provider about: The right schedule for you to have regular tests and exams. Things you can do on your own to prevent diseases and keep yourself healthy. What should I know about diet, weight, and exercise? Eat a healthy diet  Eat a diet that includes plenty of vegetables, fruits, low-fat dairy products, and lean protein. Do not eat a lot of foods that are high in solid fats, added sugars, or sodium. Maintain a healthy weight Body mass index (BMI) is a measurement that can be used to identify possible weight problems. It estimates body fat based on height and weight. Your health care provider can help determine your BMI and help you achieve or maintain a healthy weight. Get regular exercise Get regular exercise. This is one of the most important things you can do for your health. Most adults should: Exercise for at least 150 minutes each week. The exercise should increase your heart rate and make you sweat (moderate-intensity exercise). Do strengthening exercises at least twice a week. This is in addition to the moderate-intensity exercise. Spend less time sitting. Even light physical activity can be beneficial. Watch cholesterol and blood lipids Have your blood tested for lipids and cholesterol at 71 years of age, then have this test every 5 years. You may need to have your cholesterol levels checked more often if: Your lipid or cholesterol levels are high. You are older than 71 years of age. You are at high risk for heart disease. What should I know about cancer screening? Many types of cancers can be detected early and may often be prevented. Depending on your health history and family history, you may need to have cancer screening at various ages. This may include screening for: Colorectal cancer. Prostate cancer. Skin cancer. Lung  cancer. What should I know about heart disease, diabetes, and high blood pressure? Blood pressure and heart disease High blood pressure causes heart disease and increases the risk of stroke. This is more likely to develop in people who have high blood pressure readings, are of African descent, or are overweight. Talk with your health care provider about your target blood pressure readings. Have your blood pressure checked: Every 3-5 years if you are 18-39 years of age. Every year if you are 40 years old or older. If you are between the ages of 65 and 75 and are a current or former smoker, ask your health care provider if you should have a one-time screening for abdominal aortic aneurysm (AAA). Diabetes Have regular diabetes screenings. This checks your fasting blood sugar level. Have the screening done: Once every three years after age 45 if you are at a normal weight and have a low risk for diabetes. More often and at a younger age if you are overweight or have a high risk for diabetes. What should I know about preventing infection? Hepatitis B If you have a higher risk for hepatitis B, you should be screened for this virus. Talk with your health care provider to find out if you are at risk for hepatitis B infection. Hepatitis C Blood testing is recommended for: Everyone born from 1945 through 1965. Anyone with known risk factors for hepatitis C. Sexually transmitted infections (STIs) You should be screened each year for STIs, including gonorrhea and chlamydia, if: You are sexually active and are younger than 71 years of age. You are older than 71 years   of age and your health care provider tells you that you are at risk for this type of infection. Your sexual activity has changed since you were last screened, and you are at increased risk for chlamydia or gonorrhea. Ask your health care provider if you are at risk. Ask your health care provider about whether you are at high risk for HIV.  Your health care provider may recommend a prescription medicine to help prevent HIV infection. If you choose to take medicine to prevent HIV, you should first get tested for HIV. You should then be tested every 3 months for as long as you are taking the medicine. Follow these instructions at home: Lifestyle Do not use any products that contain nicotine or tobacco, such as cigarettes, e-cigarettes, and chewing tobacco. If you need help quitting, ask your health care provider. Do not use street drugs. Do not share needles. Ask your health care provider for help if you need support or information about quitting drugs. Alcohol use Do not drink alcohol if your health care provider tells you not to drink. If you drink alcohol: Limit how much you have to 0-2 drinks a day. Be aware of how much alcohol is in your drink. In the [place] one drink equals one 12 oz bottle of beer (355 mL), one 5 oz glass of wine (148 mL), or one 1 oz glass of hard liquor (44 mL). General instructions Schedule regular health, dental, and eye exams. Stay current with your vaccines. Tell your health care provider if: You often feel depressed. You have ever been abused or do not feel safe at home. Summary Adopting a healthy lifestyle and getting preventive care are important in promoting health and wellness. Follow your health care provider's instructions about healthy diet, exercising, and getting tested or screened for diseases. Follow your health care provider's instructions on monitoring your cholesterol and blood pressure. This information is not intended to replace advice given to you by your health care provider. Make sure you discuss any questions you have with your health care provider. Document Revised: 04/28/2020 Document Reviewed: 02/11/2018 Elsevier Patient Education  2022 Elsevier Inc.  

## 2020-11-13 NOTE — Progress Notes (Signed)
Patient consent obtained. Irrigation with water and peroxide performed. Full view of tympanic membranes after procedure.  Patient tolerated procedure well.   

## 2020-12-06 DIAGNOSIS — H5213 Myopia, bilateral: Secondary | ICD-10-CM | POA: Diagnosis not present

## 2021-01-19 ENCOUNTER — Other Ambulatory Visit: Payer: Self-pay | Admitting: Emergency Medicine

## 2021-01-19 DIAGNOSIS — I10 Essential (primary) hypertension: Secondary | ICD-10-CM

## 2021-02-04 NOTE — Progress Notes (Deleted)
Cardiology Office Note   Date:  02/04/2021   ID:  Nathan Stafford, DOB 04/20/1950, MRN [id number]  PCP:  Janith Lima, MD  Cardiologist:   Minus Breeding, MD Referring:  ***  No chief complaint on file.     History of Present Illness: Nathan Stafford is a 70 y.o. male who presents for ***    I have not seen him since 2020.   He presents for follow up of NICM.  This was thought to be secondary to HTN.  He had normal coronaries on cath in 2008.  The last EF in 2013 was 45%. It was about the same or slightly better when he was last seen in 2018.    ***   ***He denies any ongoing cardiovascular symptoms such as chest pressure, neck or arm discomfort.  He has had no new palpitations, presyncope or syncope.  He is a part-time job.  With this he denies any cardiovascular symptoms.  He says he is very good about taking his medicines.     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   GERD (gastroesophageal reflux disease)    Glaucoma    Hematuria    HLD (hyperlipidemia)    HTN (hypertension)    Perinephric hematoma     Past Surgical History:  Procedure Laterality Date   colonscopy     EYE SURGERY Right 1993   LASIK       Current Outpatient Medications  Medication Sig Dispense Refill   amLODipine (NORVASC) 10 MG tablet Take 1 tablet (10 mg total) by mouth daily. 90 tablet 0   aspirin 81 MG tablet Take 1 tablet (81 mg total) by mouth daily. 90 tablet 3   brimonidine (ALPHAGAN) 0.2 % ophthalmic solution Place 1 drop into both eyes 2 (two) times daily.      dorzolamide-timolol (COSOPT) 22.3-6.8 MG/ML ophthalmic solution Place 1 drop into both eyes 2 (two) times daily.      hydrochlorothiazide (HYDRODIURIL) 25 MG tablet Take 1 tablet by mouth once daily 90 tablet 0    metoprolol succinate (TOPROL-XL) 50 MG 24 hr tablet Take 1 tablet (50 mg total) by mouth daily. Take with or immediately following a meal. 90 tablet 3   Omega-3 Fatty Acids (FISH OIL PO) Take by mouth as needed.     sildenafil (VIAGRA) 50 MG tablet Take 1 tablet (50 mg total) by mouth daily as needed for erectile dysfunction. 30 tablet 3   simvastatin (ZOCOR) 20 MG tablet Take 1 tablet (20 mg total) by mouth at bedtime. 90 tablet 1   No current facility-administered medications for this visit.    Allergies:   Ace inhibitors    ROS:  Please see the history of present illness.   Otherwise, review of systems are positive for {NONE DEFAULTED:18576}.   All other systems are reviewed and negative.    PHYSICAL EXAM: VS:  There were no vitals taken for this visit. , BMI There is no height or weight on file to calculate BMI. GENERAL:  Well appearing NECK:  No jugular venous distention, waveform within normal limits, carotid upstroke brisk and symmetric, no bruits, no thyromegaly LUNGS:  Clear to auscultation bilaterally CHEST:  Unremarkable HEART:  PMI not displaced or sustained,S1 and S2 within normal limits, no S3, no  S4, no clicks, no rubs, *** murmurs ABD:  Flat, positive bowel sounds normal in frequency in pitch, no bruits, no rebound, no guarding, no midline pulsatile mass, no hepatomegaly, no splenomegaly EXT:  2 plus pulses throughout, no edema, no cyanosis no clubbing   EKG:  EKG {ACTION; IS/IS ZOX:[id number]} ordered today. The ekg ordered today demonstrates ***   Recent Labs: 05/11/2020: ALT 9 11/13/2020: BUN 15; Creatinine, Ser 0.82; Potassium 4.0; Sodium 139; TSH 1.40    Lipid Panel    Component Value Date/Time   CHOL 128 05/11/2020 0944   TRIG 83 05/11/2020 0944   HDL 33 (L) 05/11/2020 0944   CHOLHDL 3.9 05/11/2020 0944   CHOLHDL 4.2 04/26/2015 1440   VLDL 24 04/26/2015 1440   LDLCALC 79 05/11/2020 0944      Wt Readings from Last 3 Encounters:  11/13/20 196 lb  (88.9 kg)  05/11/20 205 lb (93 kg)  03/08/20 202 lb (91.6 kg)      Other studies Reviewed: Additional studies/ records that were reviewed today include: ***. Review of the above records demonstrates:  Please see elsewhere in the note.  ***   ASSESSMENT AND PLAN:  NON ISCHEMIC CARDIOMYOPATHY:    *** His ejection fraction was stable if not slightly improved in May 2018 but he has no symptoms.  I do not think to be any indication for further imaging.  At this point no further testing is indicated.  He will continue with aggressive risk reduction.    HTN:  The blood pressure was *** at target at the last visit.  He is going to look into getting a BP cuff so that we can follow this.  No change in therapy. He will send to me the BP readings.    ED:    *** I am OK with giving a limited Viagra prescription if requested.    RBBB:   ***  This has been chronic.  No change in therapy.      Current medicines are reviewed at length with the patient today.  The patient {ACTIONS; HAS/DOES NOT HAVE:19233} concerns regarding medicines.  The following changes have been made:  {PLAN; NO CHANGE:13088:s}  Labs/ tests ordered today include: *** No orders of the defined types were placed in this encounter.    Disposition:   FU with ***    Signed, Minus Breeding, MD  02/04/2021 3:43 PM    [facility]

## 2021-02-06 ENCOUNTER — Ambulatory Visit: Payer: Medicare HMO | Admitting: Cardiology

## 2021-03-14 ENCOUNTER — Ambulatory Visit: Payer: Medicare HMO | Admitting: Cardiology

## 2021-03-26 ENCOUNTER — Other Ambulatory Visit: Payer: Self-pay | Admitting: Emergency Medicine

## 2021-03-26 DIAGNOSIS — I429 Cardiomyopathy, unspecified: Secondary | ICD-10-CM

## 2021-03-27 ENCOUNTER — Encounter: Payer: Self-pay | Admitting: Cardiology

## 2021-03-27 ENCOUNTER — Ambulatory Visit: Payer: Medicare HMO | Admitting: Cardiology

## 2021-03-27 ENCOUNTER — Other Ambulatory Visit: Payer: Self-pay

## 2021-03-27 VITALS — BP 148/76 | HR 83 | Ht 73.0 in | Wt 191.0 lb

## 2021-03-27 DIAGNOSIS — I429 Cardiomyopathy, unspecified: Secondary | ICD-10-CM | POA: Diagnosis not present

## 2021-03-27 DIAGNOSIS — I4510 Unspecified right bundle-branch block: Secondary | ICD-10-CM

## 2021-03-27 DIAGNOSIS — R7303 Prediabetes: Secondary | ICD-10-CM

## 2021-03-27 DIAGNOSIS — R69 Illness, unspecified: Secondary | ICD-10-CM | POA: Diagnosis not present

## 2021-03-27 DIAGNOSIS — I110 Hypertensive heart disease with heart failure: Secondary | ICD-10-CM

## 2021-03-27 DIAGNOSIS — I5042 Chronic combined systolic (congestive) and diastolic (congestive) heart failure: Secondary | ICD-10-CM | POA: Diagnosis not present

## 2021-03-27 DIAGNOSIS — E7849 Other hyperlipidemia: Secondary | ICD-10-CM | POA: Diagnosis not present

## 2021-03-27 DIAGNOSIS — F17200 Nicotine dependence, unspecified, uncomplicated: Secondary | ICD-10-CM | POA: Diagnosis not present

## 2021-03-27 MED ORDER — METOPROLOL SUCCINATE ER 100 MG PO TB24: 100.0000 mg | ORAL_TABLET | Freq: Every day | ORAL | 3 refills | Status: DC

## 2021-03-27 NOTE — Patient Instructions (Addendum)
Medication Instructions:   Increase metoprolol succinate 100 mg  take daily  after you have been taking for at least 2 weeks  check your blood pressure at 3 times a week  and writ it down  and bring to the next office visit   *If you need a refill on your cardiac medications before your next appointment, please call your pharmacy*   Lab Work: Not needed   Testing/Procedures: Not needed   Follow-Up: At [facility], you and your health needs are our priority.  As part of our continuing mission to provide you with exceptional heart care, we have created designated Provider Care Teams.  These Care Teams include your primary Cardiologist (physician) and Advanced Practice Providers (APPs -  Physician Assistants and Nurse Practitioners) who all work together to provide you with the care you need, when you need it.  We recommend signing up for the patient portal called "MyChart".  Sign up information is provided on this After Visit Summary.  MyChart is used to connect with patients for Virtual Visits (Telemedicine).  Patients are able to view lab/test results, encounter notes, upcoming appointments, etc.  Non-urgent messages can be sent to your provider as well.   To learn more about what you can do with MyChart, go to [email or web address].    Your next appointment:   3 month(s)  The format for your next appointment:   In Person  Provider:   Coletta Memos, FNP or Almyra Deforest, PA-C    Then, Glenetta Hew, MD will plan to see you again in 9 month(s).

## 2021-03-27 NOTE — Progress Notes (Signed)
Primary Care Provider: Janith Lima, MD [facility] Cardiologist: Nathan Hew, MD Electrophysiologist: None  Clinic Note: Chief Complaint  Patient presents with   New Patient (Initial Visit)   Cardiomyopathy    Has been stable.  No CHF or angina symptoms.   Hypertension    Borderline control    ===================================  ASSESSMENT/PLAN   Problem List Items Addressed This Visit       Cardiology Problems   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 (Chronic)    EF is really only in the low normal on most recent check in 2018.  50% of diffuse hypokinesis and moderate LVH.  Probably hypertensive.  Unfortunately we are are limited as far as no ACE inhibitor because of this allergy to ACE inhibitors. Blood pressure still high so we need to get it under control.  No room to titrate amlodipine or HCTZ. We will increase Toprol to 100 mg daily.      Relevant Medications   metoprolol succinate (TOPROL-XL) 100 MG 24 hr tablet   Other Relevant Orders   EKG 12-Lead (Completed)   RBBB (Chronic)    Chronic stable finding now with LAFB.  Also seems relatively chronic chronic with no active cardiac symptoms.  Minimally reduced EF but no CHF symptoms.   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  His heart rate is 83.  He is on max dose of amlodipine and HCTZ.  Probably need to titrate up blood pressure control.  Will increase beta-blocker (Toprol to 100 daily).  If necessary, can split taking 50 mg twice daily.  We will need to continue to follow blood pressures I will have him follow-up in 3 months with APP.  Hopefully by then will also have new set of labs from PCP.Marland Kitchen      Relevant Medications   metoprolol succinate (TOPROL-XL) 100 MG 24 hr  tablet   Hyperlipidemia due to dietary fat intake (Chronic)    On modest dose simvastatin.  Labs checked in March 2022 look pretty well controlled with LDL 79 and cholesterol 128.  Should be due for labs to be checked by PCP soon.  Will defer to PCP to check, but it appears that his lipids are well controlled.      Relevant Medications   metoprolol succinate (TOPROL-XL) 100 MG 24 hr tablet     Other   Prediabetes    Should be due for follow-up labs by PCP for both A1c and lipid panel. Most recent A1c from last year was 5.9.      Current smoker (Chronic)    Smoking cessation instruction/counseling given:  counseled patient on the dangers of tobacco use, advised patient to stop smoking, and reviewed strategies to maximize success        ===================================  HPI:    Nathan Stafford is a 71 y.o. male with PMH notable for an ICM (EF reported 40 to 45% on echo in 2013 with normal Coronaries by Cath in 2008), along with HTN and RBBB/LAFB on EKG.  He is seen being seen today at the request of Nathan Lima, MD, ostensibly, to reestablish cardiology care.Nathan Stafford was last seen via telemedicine by Dr. Percival Stafford on June 22, 2018: Patient denied any chest discomfort or pressure with rest or  exertion.  No exertional dyspnea.  No palpitations syncope or near syncope.  Reportedly was doing well with medication management.  Recent Hospitalizations: None  Reviewed  CV studies:    The following studies were reviewed today: (if available, images/films reviewed: From Epic Chart or Care Everywhere) TTE 07/2011: EF 40-45%, Mod LVH TTE 07/29/2016: EF 50% diffuse HK.  Moderate LVH (more prominent inferolateral wall).  Mild L A dilation.  GR 1 DD.  Normal RV size and function.  No valve disease   Interval History:   Nathan Stafford presents today overall doing relatively well.  He really is relatively stable overall from a cardiac standpoint.  He is no symptoms of PND, orthopnea.  He  does have occasional ankle swelling off and on but that usually goes down by the end of the day. What has been noticing is his blood pressures been going up of late, and he may note having a headache if his BP goes high.  Blood pressures have been doing better since getting back on amlodipine.  He denies any chest pain or pressure at rest or exertion.  He does have some exertional dyspnea, probably related to deconditioning.  He does not work full-time but does part-time jobs off-and-on. No real sensation of rapid irregular heartbeats or palpitations.  No syncope or near syncope.  No TIA or amaurosis fugax.  No claudication  REVIEWED OF SYSTEMS   Review of Systems  Constitutional:  Negative for malaise/fatigue (Just deconditioned, out of shape.) and weight loss.  HENT:  Negative for nosebleeds.   Respiratory:  Negative for cough, shortness of breath and wheezing.   Gastrointestinal:  Negative for blood in stool and melena.  Genitourinary:  Negative for hematuria.  Musculoskeletal:  Positive for joint pain.  Neurological:  Positive for headaches (Per HPI-off-and-on, often associated with elevated BP). Negative for dizziness and focal weakness.  Psychiatric/Behavioral: Negative.     I have reviewed and (if needed) personally updated the patient's problem list, medications, allergies, past medical and surgical history, social and family history.   PAST MEDICAL HISTORY   Past Medical History:  Diagnosis Date   Cardiomyopathy secondary    likely HTN cardiomyopathy;  Echocardiogram was obtained 07/18/11: Moderate LVH, EF 16-01%, grade 1 diastolic dysfunction, mild MR, mild RAE.;  cardiac cath in 2008 with normal coronary arteries.   GERD (gastroesophageal reflux disease)    Glaucoma    Hematuria    HLD (hyperlipidemia)    HTN (hypertension)    Perinephric hematoma     PAST SURGICAL HISTORY   Past Surgical History:  Procedure Laterality Date   colonscopy     EYE SURGERY Right 1993    LASIK      Immunization History  Administered Date(s) Administered   Influenza-Unspecified 12/07/2019   PFIZER(Purple Top)SARS-COV-2 Vaccination 05/28/2019, 06/18/2019   Pneumococcal Conjugate-13 04/26/2015    MEDICATIONS/ALLERGIES   Current Meds  Medication Sig   amLODipine (NORVASC) 10 MG tablet Take 1 tablet (10 mg total) by mouth daily.   aspirin 81 MG tablet Take 1 tablet (81 mg total) by mouth daily.   brimonidine (ALPHAGAN) 0.2 % ophthalmic solution Place 1 drop into both eyes 2 (two) times daily.    dorzolamide-timolol (COSOPT) 22.3-6.8 MG/ML ophthalmic solution Place 1 drop into both eyes 2 (two) times daily.    hydrochlorothiazide (HYDRODIURIL) 25 MG tablet Take 1 tablet by mouth once daily   metoprolol succinate (TOPROL-XL) 100 MG 24 hr tablet Take 1 tablet (100 mg total) by mouth daily. Take  with or immediately following a meal.   Omega-3 Fatty Acids (FISH OIL PO) Take by mouth as needed.   sildenafil (VIAGRA) 50 MG tablet TAKE 1 TABLET BY MOUTH ONCE DAILY AS NEEDED FOR ERECTILE DYSFUNCTION   simvastatin (ZOCOR) 20 MG tablet Take 1 tablet (20 mg total) by mouth at bedtime.   [DISCONTINUED] metoprolol succinate (TOPROL-XL) 50 MG 24 hr tablet Take 1 tablet (50 mg total) by mouth daily. Take with or immediately following a meal.    Allergies  Allergen Reactions   Ace Inhibitors Swelling    SOCIAL HISTORY/FAMILY HISTORY   Reviewed in Epic:   Social History   Tobacco Use   Smoking status: Every Day    Packs/day: 0.50    Years: 40.00    Pack years: 20.00    Types: Cigarettes   Smokeless tobacco: Never  Substance Use Topics   Alcohol use: Yes    Alcohol/week: 35.0 standard drinks    Types: 35 Cans of beer per week   Drug use: No   Social History   Social History Narrative   Lives with his wife.  - does work some - doing Part time jobs, no full time work.  Family History  Problem Relation Age of Onset   Diabetes Father     OBJCTIVE -PE, EKG, labs   Wt  Readings from Last 3 Encounters:  03/27/21 191 lb (86.6 kg)  11/13/20 196 lb (88.9 kg)  05/11/20 205 lb (93 kg)    Physical Exam: BP (!) 148/76    Pulse 83    Ht 6\' 1"  (1.854 m)    Wt 191 lb (86.6 kg)    SpO2 97%    BMI 25.20 kg/m  Physical Exam Vitals reviewed.  Constitutional:      General: He is not in acute distress.    Appearance: Normal appearance. He is normal weight. He is not ill-appearing or toxic-appearing.  HENT:     Head: Normocephalic and atraumatic.  Eyes:     Extraocular Movements: Extraocular movements intact.     Pupils: Pupils are equal, round, and reactive to light.  Neck:     Vascular: No carotid bruit.  Cardiovascular:     Rate and Rhythm: Normal rate and regular rhythm.     Pulses: Normal pulses.     Heart sounds: Normal heart sounds. No murmur heard.   No friction rub. No gallop.  Pulmonary:     Effort: Pulmonary effort is normal. No respiratory distress.     Breath sounds: Normal breath sounds. No wheezing, rhonchi or rales.  Chest:     Chest wall: No tenderness.  Abdominal:     General: Bowel sounds are normal. There is no distension.     Palpations: Abdomen is soft.     Tenderness: There is no abdominal tenderness.  Musculoskeletal:        General: No swelling.     Cervical back: Normal range of motion and neck supple.  Skin:    General: Skin is warm and dry.     Coloration: Skin is not jaundiced.  Neurological:     General: No focal deficit present.     Mental Status: He is alert and oriented to person, place, and time.     Motor: No weakness.  Psychiatric:        Mood and Affect: Mood normal.        Behavior: Behavior normal.        Thought Content: Thought content normal.  Judgment: Judgment normal.     Adult ECG Report  Rate: 83 ;  Rhythm: normal sinus rhythm and RBBB/ LAFB - bifascicular block, LVH with Repolarization abnormality. CRO Septal MI, age undetermined.  Narrative Interpretation: stable  Recent Labs: Reviewed Lab  Results  Component Value Date   CHOL 128 05/11/2020   HDL 33 (L) 05/11/2020   LDLCALC 79 05/11/2020   TRIG 83 05/11/2020   CHOLHDL 3.9 05/11/2020   Lab Results  Component Value Date   CREATININE 0.82 11/13/2020   BUN 15 11/13/2020   NA 139 11/13/2020   K 4.0 11/13/2020   CL 100 11/13/2020   CO2 29 11/13/2020   CBC Latest Ref Rng & Units 05/09/2017 07/06/2016 04/26/2015  WBC 3.4 - 10.8 x10E3/uL 7.0 9.7 7.3  Hemoglobin 13.0 - 17.7 g/dL 14.9 14.1 15.0  Hematocrit 37.5 - 51.0 % 44.5 42.5 45.4  Platelets 150 - 379 x10E3/uL 173 167 218    Lab Results  Component Value Date   HGBA1C 5.9 11/13/2020   Lab Results  Component Value Date   TSH 1.40 11/13/2020    ==================================================  COVID-19 Education: The signs and symptoms of COVID-19 were discussed with the patient and how to seek care for testing (follow up with PCP or arrange E-visit).    I spent a total of 21  minutes with the patient spent in direct patient consultation.  Additional time spent with chart review  / charting (studies, outside notes, etc): 17 min Total Time: 38 min  Current medicines are reviewed at length with the patient today.  (+/- concerns) n/a  This visit occurred during the SARS-CoV-2 public health emergency.  Safety protocols were in place, including screening questions prior to the visit, additional usage of staff PPE, and extensive cleaning of exam room while observing appropriate contact time as indicated for disinfecting solutions.  Notice: This dictation was prepared with Dragon dictation along with smart phrase technology. Any transcriptional errors that result from this process are unintentional and may not be corrected upon review.   Studies Ordered:  Orders Placed This Encounter  Procedures   EKG 12-Lead    Patient Instructions / Medication Changes & Studies & Tests Ordered   Patient Instructions  Medication Instructions:   Increase metoprolol succinate 100  mg  take daily  after you have been taking for at least 2 weeks  check your blood pressure at 3 times a week  and writ it down  and bring to the next office visit   *If you need a refill on your cardiac medications before your next appointment, please call your pharmacy*   Lab Work: Not needed   Testing/Procedures: Not needed   Follow-Up: At [facility], you and your health needs are our priority.  As part of our continuing mission to provide you with exceptional heart care, we have created designated Provider Care Teams.  These Care Teams include your primary Cardiologist (physician) and Advanced Practice Providers (APPs -  Physician Assistants and Nurse Practitioners) who all work together to provide you with the care you need, when you need it.  We recommend signing up for the patient portal called "MyChart".  Sign up information is provided on this After Visit Summary.  MyChart is used to connect with patients for Virtual Visits (Telemedicine).  Patients are able to view lab/test results, encounter notes, upcoming appointments, etc.  Non-urgent messages can be sent to your provider as well.   To learn more about what you can do with MyChart,  go to [email or web address].    Your next appointment:   3 month(s)  The format for your next appointment:   In Person  Provider:   Coletta Memos, FNP or Almyra Deforest, PA-C    Then, Nathan Hew, MD will plan to see you again in 9 month(s).       Nathan Stafford, M.D., M.S. Interventional Cardiologist   Pager # [phone] Phone # [phone] [address] [place], [place] [address]   Thank you for choosing Heartcare at [facility]!!

## 2021-04-13 ENCOUNTER — Telehealth: Payer: Self-pay | Admitting: Cardiology

## 2021-04-13 ENCOUNTER — Other Ambulatory Visit: Payer: Self-pay | Admitting: Emergency Medicine

## 2021-04-13 DIAGNOSIS — I10 Essential (primary) hypertension: Secondary | ICD-10-CM

## 2021-04-13 NOTE — Telephone Encounter (Signed)
Spoke with patient who reports starting the 100 mg of metoprolol yesterday and it made him feel very drowsy. He does not check his blood pressure. He is open to going back to the 50 mg qd or taking 50 mg bid. Please advise.

## 2021-04-13 NOTE — Telephone Encounter (Signed)
Pt c/o medication issue:  1. Name of Medication: metoprolol succinate (TOPROL-XL) 100 MG 24 hr tablet  2. How are you currently taking this medication (dosage and times per day)? Take 1 tablet (100 mg total) by mouth daily. Take with or immediately following a meal.  3. Are you having a reaction (difficulty breathing--STAT)?   4. What is your medication issue? Patient states he stated taking the 100mg  yesterday.  He states that it made him feel swimmy head, sleepy.  He said the 50mg  never made him feel that way.

## 2021-04-14 NOTE — Telephone Encounter (Signed)
See if he can do okay with taking half tablet (50 mg) twice daily.  Nathan Stafford

## 2021-04-15 ENCOUNTER — Encounter: Payer: Self-pay | Admitting: Cardiology

## 2021-04-15 DIAGNOSIS — E7849 Other hyperlipidemia: Secondary | ICD-10-CM | POA: Insufficient documentation

## 2021-04-15 NOTE — Assessment & Plan Note (Signed)
Blood pressure still not very well controlled.  His heart rate is 83.  He is on max dose of amlodipine and HCTZ.  Probably need to titrate up blood pressure control.  Will increase beta-blocker (Toprol to 100 daily).  If necessary, can split taking 50 mg twice daily.  We will need to continue to follow blood pressures I will have him follow-up in 3 months with APP.  Hopefully by then will also have new set of labs from PCP.Marland Kitchen

## 2021-04-15 NOTE — Assessment & Plan Note (Signed)
EF is really only in the low normal on most recent check in 2018.  50% of diffuse hypokinesis and moderate LVH.  Probably hypertensive.  Unfortunately we are are limited as far as no ACE inhibitor because of this allergy to ACE inhibitors. Blood pressure still high so we need to get it under control.  No room to titrate amlodipine or HCTZ. We will increase Toprol to 100 mg daily.

## 2021-04-15 NOTE — Assessment & Plan Note (Signed)
On modest dose simvastatin.  Labs checked in March 2022 look pretty well controlled with LDL 79 and cholesterol 128.  Should be due for labs to be checked by PCP soon.  Will defer to PCP to check, but it appears that his lipids are well controlled.

## 2021-04-15 NOTE — Assessment & Plan Note (Addendum)
Chronic stable finding now with LAFB.  Also seems relatively chronic chronic with no active cardiac symptoms.  Minimally reduced EF but no CHF symptoms.   On stable medicines

## 2021-04-15 NOTE — Assessment & Plan Note (Addendum)
Should be due for follow-up labs by PCP for both A1c and lipid panel. Most recent A1c from last year was 5.9.

## 2021-04-15 NOTE — Assessment & Plan Note (Signed)
Smoking cessation instruction/counseling given:  counseled patient on the dangers of tobacco use, advised patient to stop smoking, and reviewed strategies to maximize success 

## 2021-04-17 ENCOUNTER — Other Ambulatory Visit: Payer: Self-pay

## 2021-04-17 MED ORDER — METOPROLOL SUCCINATE ER 50 MG PO TB24: 50.0000 mg | ORAL_TABLET | Freq: Two times a day (BID) | ORAL | 3 refills | Status: DC

## 2021-04-17 NOTE — Telephone Encounter (Signed)
Spoke with patient. Informed him to take metoprolol succinate 50 mg in the morning and 50 mg in the evening. He voiced understanding to cut his 100 mg tablet in half and take 1/2 in the morning and 1/2 in the evening. He stated he will monitor his bp too.

## 2021-04-17 NOTE — Telephone Encounter (Signed)
Thank you.  We will see how he does.

## 2021-05-01 ENCOUNTER — Other Ambulatory Visit: Payer: Self-pay | Admitting: Emergency Medicine

## 2021-05-01 ENCOUNTER — Other Ambulatory Visit: Payer: Self-pay | Admitting: Internal Medicine

## 2021-05-01 DIAGNOSIS — H401121 Primary open-angle glaucoma, left eye, mild stage: Secondary | ICD-10-CM | POA: Diagnosis not present

## 2021-05-01 DIAGNOSIS — H401113 Primary open-angle glaucoma, right eye, severe stage: Secondary | ICD-10-CM | POA: Diagnosis not present

## 2021-05-01 DIAGNOSIS — I10 Essential (primary) hypertension: Secondary | ICD-10-CM

## 2021-05-01 DIAGNOSIS — I429 Cardiomyopathy, unspecified: Secondary | ICD-10-CM

## 2021-05-01 DIAGNOSIS — I110 Hypertensive heart disease with heart failure: Secondary | ICD-10-CM

## 2021-05-03 DIAGNOSIS — I429 Cardiomyopathy, unspecified: Secondary | ICD-10-CM | POA: Diagnosis not present

## 2021-05-03 DIAGNOSIS — Z7982 Long term (current) use of aspirin: Secondary | ICD-10-CM | POA: Diagnosis not present

## 2021-05-03 DIAGNOSIS — N529 Male erectile dysfunction, unspecified: Secondary | ICD-10-CM | POA: Diagnosis not present

## 2021-05-03 DIAGNOSIS — I10 Essential (primary) hypertension: Secondary | ICD-10-CM | POA: Diagnosis not present

## 2021-05-03 DIAGNOSIS — Z888 Allergy status to other drugs, medicaments and biological substances status: Secondary | ICD-10-CM | POA: Diagnosis not present

## 2021-05-03 DIAGNOSIS — H409 Unspecified glaucoma: Secondary | ICD-10-CM | POA: Diagnosis not present

## 2021-05-03 DIAGNOSIS — R69 Illness, unspecified: Secondary | ICD-10-CM | POA: Diagnosis not present

## 2021-05-03 DIAGNOSIS — Z8249 Family history of ischemic heart disease and other diseases of the circulatory system: Secondary | ICD-10-CM | POA: Diagnosis not present

## 2021-05-03 DIAGNOSIS — Z008 Encounter for other general examination: Secondary | ICD-10-CM | POA: Diagnosis not present

## 2021-05-03 DIAGNOSIS — Z833 Family history of diabetes mellitus: Secondary | ICD-10-CM | POA: Diagnosis not present

## 2021-05-03 DIAGNOSIS — E785 Hyperlipidemia, unspecified: Secondary | ICD-10-CM | POA: Diagnosis not present

## 2021-06-21 NOTE — Progress Notes (Signed)
? ?Cardiology Clinic Note  ? ?Patient Name: Nathan Stafford ?Date of Encounter: 06/25/2021 ? ?Primary Care Provider:  Janith Lima, MD ?Primary Cardiologist:  Glenetta Hew, MD ? ?Patient Profile  ?  ?Nathan Stafford presents to the clinic today for follow-up evaluation of her cardiomyopathy and hypertension. ? ?Past Medical History  ?  ?Past Medical History:  ?Diagnosis Date  ? Cardiomyopathy secondary   ? 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  ? GERD (gastroesophageal reflux disease)   ? Glaucoma   ? Hematuria   ? HLD (hyperlipidemia)   ? HTN (hypertension)   ? Perinephric hematoma   ? ?Past Surgical History:  ?Procedure Laterality Date  ? colonscopy    ? EYE SURGERY Right 1993  ? LASIK    ? ? ?Allergies ? ?Allergies  ?Allergen Reactions  ? Ace Inhibitors Swelling  ? ? ?History of Present Illness  ?  ?Nathan Stafford has a PMH of prediabetes, hyperlipidemia, HTN, RBBB and cardiomyopathy.  His EF was 40-45% on echocardiogram in 2013.  Follow-up echocardiogram in 2018 showed LVEF in the low normal range.  He was noted to have normal coronaries by cath in 2008.  His PMH also includes tobacco abuse and ACE inhibitor allergy. ? ?He was seen by Dr. Ellyn Hack on 03/27/2021.  During that time his blood pressure was elevated at 148/76.  He was instructed to increase his metoprolol to succinate to 100 mg daily.  He remained stable from a cardiac standpoint.  Follow-up was planned for 3 months. ? ?Contacted nurse triage line on 04/13/2021.  He reported that when taking 100 mg of metoprolol he would feel drowsy.  He was transition back to  50 mg a.m. and 50 mg p.m. ? ?He presents to the clinic today for follow-up evaluation states he feels well.  He is currently taking 100 mg of his metoprolol in the morning and denies drowsiness.  He continues to work part-time sweeping.  He  reports that he tried to retire and felt like he needed something to do.  He has been at his current job for around 27 years.  He continues to follow a low-sodium diet.  We reviewed his prescription and he expressed understanding.  He has been monitoring his blood pressure at the local store.  It has been well controlled in the 120s over 70s-80s.  I will give him a salty 6 diet sheet, refill his metoprolol, and plan follow-up for 9 to 12 months. ? ?Today he denies chest pain, shortness of breath, lower extremity edema, fatigue, palpitations, melena, hematuria, hemoptysis, diaphoresis, weakness, presyncope, syncope, orthopnea, and PND. ? ? ?Home Medications  ?  ?Prior to Admission medications   ?Medication Sig Start Date End Date Taking? Authorizing Provider  ?amLODipine (NORVASC) 10 MG tablet Take 1 tablet by mouth once daily 05/01/21   Janith Lima, MD  ?aspirin 81 MG tablet Take 1 tablet (81 mg total) by mouth daily. 05/09/17   Tereasa Coop, PA-C  ?brimonidine (ALPHAGAN) 0.2 % ophthalmic solution Place 1 drop into both eyes 2 (two) times daily.     [provider]  ?dorzolamide-timolol (COSOPT) 22.3-6.8 MG/ML ophthalmic solution Place 1 drop into both eyes 2 (two) times daily.     [provider]  ?hydrochlorothiazide (HYDRODIURIL) 25 MG tablet Take 1 tablet by mouth once daily 05/01/21   Horald Pollen, MD  ?  metoprolol succinate (TOPROL-XL) 50 MG 24 hr tablet Take 1 tablet (50 mg total) by mouth 2 (two) times daily. Take with or immediately following a meal. 04/17/21 07/16/21  Leonie Man, MD  ?Omega-3 Fatty Acids (FISH OIL PO) Take by mouth as needed.    [provider]  ?sildenafil (VIAGRA) 50 MG tablet TAKE 1 TABLET BY MOUTH ONCE DAILY AS NEEDED FOR ERECTILE DYSFUNCTION 03/26/21   Horald Pollen, MD  ?simvastatin (ZOCOR) 20 MG tablet Take 1 tablet (20 mg total) by mouth at bedtime. 11/13/20 03/27/21  Janith Lima, MD  ? ? ?Family History  ?  ?Family History   ?Problem Relation Age of Onset  ? Diabetes Father   ? ?He indicated that his mother is deceased. He indicated that his father is alive. He indicated that five of his seven sisters are alive. He indicated that three of his five brothers are alive. He indicated that his maternal grandmother is deceased. He indicated that his maternal grandfather is deceased. He indicated that his paternal grandmother is deceased. He indicated that his paternal grandfather is deceased. He indicated that his son is alive. ? ?Social History  ?  ?Social History  ? ?Socioeconomic History  ? Marital status: Married  ?  Spouse name: Not on file  ? Number of children: 1  ? Years of education: Not on file  ? Highest education level: Not on file  ?Occupational History  ? Occupation: Receiving [place]  ?  Employer: [employer]  ?Tobacco Use  ? Smoking status: Every Day  ?  Packs/day: 0.50  ?  Years: 40.00  ?  Pack years: 20.00  ?  Types: Cigarettes  ? Smokeless tobacco: Never  ?Substance and Sexual Activity  ? Alcohol use: Yes  ?  Alcohol/week: 35.0 standard drinks  ?  Types: 35 Cans of beer per week  ? Drug use: No  ? Sexual activity: Yes  ?  Partners: Female  ?Other Topics Concern  ? Not on file  ?Social History Narrative  ? Lives with his wife.  ? ?Social Determinants of Health  ? ?Financial Resource Strain: Not on file  ?Food Insecurity: Not on file  ?Transportation Needs: Not on file  ?Physical Activity: Not on file  ?Stress: Not on file  ?Social Connections: Not on file  ?Intimate Partner Violence: Not on file  ?  ? ?Review of Systems  ?  ?General:  No chills, fever, night sweats or weight changes.  ?Cardiovascular:  No chest pain, dyspnea on exertion, edema, orthopnea, palpitations, paroxysmal nocturnal dyspnea. ?Dermatological: No rash, lesions/masses ?Respiratory: No cough, dyspnea ?Urologic: No hematuria, dysuria ?Abdominal:   No nausea, vomiting, diarrhea, bright red blood per rectum, melena, or hematemesis ?Neurologic:  No visual changes,  wkns, changes in mental status. ?All other systems reviewed and are otherwise negative except as noted above. ? ?Physical Exam  ?  ?VS:  BP 126/80 (BP Location: Left Arm, Patient Position: Sitting, Cuff Size: Normal)   Pulse 69   Ht '6\' 1"'$  (1.854 m)   Wt 189 lb 12.8 oz (86.1 kg)   BMI 25.04 kg/m?  , BMI Body mass index is 25.04 kg/m?. ?GEN: Well nourished, well developed, in no acute distress. ?HEENT: normal. ?Neck: Supple, no JVD, carotid bruits, or masses. ?Cardiac: RRR, no murmurs, rubs, or gallops. No clubbing, cyanosis, edema.  Radials/DP/PT 2+ and equal bilaterally.  ?Respiratory:  Respirations regular and unlabored, clear to auscultation bilaterally. ?GI: Soft, nontender, nondistended, BS + x 4. ?MS: no deformity  or atrophy. ?Skin: warm and dry, no rash. ?Neuro:  Strength and sensation are intact. ?Psych: Normal affect. ? ?Accessory Clinical Findings  ?  ?Recent Labs: ?11/13/2020: BUN 15; Creatinine, Ser 0.82; Potassium 4.0; Sodium 139; TSH 1.40  ? ?Recent Lipid Panel ?   ?Component Value Date/Time  ? CHOL 128 05/11/2020 0944  ? TRIG 83 05/11/2020 0944  ? HDL 33 (L) 05/11/2020 0944  ? CHOLHDL 3.9 05/11/2020 0944  ? CHOLHDL 4.2 04/26/2015 1440  ? VLDL 24 04/26/2015 1440  ? [place] 79 05/11/2020 0944  ? ? ?ECG personally reviewed by me today-none today. ? ?Echocardiogram 07/26/2016 ? ?Study Conclusions  ? ?- Left ventricle: The cavity size was normal. Wall thickness was  ?  increased in a pattern of moderate LVH. The estimated ejection  ?  fraction was 50%. Diffuse hypokinesis, somewhat worse in the  ?  inferolateral wall. Doppler parameters are consistent with  ?  abnormal left ventricular relaxation (grade 1 diastolic  ?  dysfunction).  ?- Aortic valve: There was no stenosis.  ?- Aorta: Mildly dilated aortic root. Aortic root dimension: 39 mm  ?  (ED).  ?- Mitral valve: There was trivial regurgitation.  ?- Left atrium: The atrium was mildly dilated.  ?- Right ventricle: The cavity size was normal. Systolic  function  ?  was normal.  ?- Tricuspid valve: Peak RV-RA gradient (S): 26 mm Hg.  ?- Pulmonary arteries: PA peak pressure: 29 mm Hg (S).  ?- Inferior vena cava: The vessel was normal in size. The  ?  respirophasic diamet

## 2021-06-22 ENCOUNTER — Other Ambulatory Visit: Payer: Self-pay | Admitting: Emergency Medicine

## 2021-06-22 DIAGNOSIS — E785 Hyperlipidemia, unspecified: Secondary | ICD-10-CM

## 2021-06-25 ENCOUNTER — Ambulatory Visit: Payer: Medicare HMO | Admitting: General Practice

## 2021-06-25 ENCOUNTER — Encounter: Payer: Self-pay | Admitting: General Practice

## 2021-06-25 VITALS — BP 126/80 | HR 69 | Ht 73.0 in | Wt 189.8 lb

## 2021-06-25 DIAGNOSIS — E7849 Other hyperlipidemia: Secondary | ICD-10-CM | POA: Diagnosis not present

## 2021-06-25 DIAGNOSIS — I5042 Chronic combined systolic (congestive) and diastolic (congestive) heart failure: Secondary | ICD-10-CM

## 2021-06-25 DIAGNOSIS — I4510 Unspecified right bundle-branch block: Secondary | ICD-10-CM

## 2021-06-25 DIAGNOSIS — I429 Cardiomyopathy, unspecified: Secondary | ICD-10-CM | POA: Diagnosis not present

## 2021-06-25 DIAGNOSIS — I110 Hypertensive heart disease with heart failure: Secondary | ICD-10-CM

## 2021-06-25 MED ORDER — METOPROLOL SUCCINATE ER 100 MG PO TB24: 100.0000 mg | ORAL_TABLET | Freq: Two times a day (BID) | ORAL | 12 refills | Status: DC

## 2021-06-25 NOTE — Patient Instructions (Signed)
Medication Instructions:  ?The current medical regimen is effective;  continue present plan and medications as directed. Please refer to the Current Medication list given to you today.  ? ?*If you need a refill on your cardiac medications before your next appointment, please call your pharmacy* ? ?Lab Work:   Testing/Procedures:  ?NONE    NONE ? ?If you have labs (blood work) drawn today and your tests are completely normal, you will receive your results only by: ?MyChart Message (if you have MyChart) OR  A paper copy in the mail ?If you have any lab test that is abnormal or we need to change your treatment, we will call you to review the results. ? ?Special Instructions ?PLEASE READ AND FOLLOW SALTY 6-ATTACHED-1,'800mg'$  daily ? ?PLEASE MAINTAIN PHYSICAL ACTIVITY AS TOLERATED  ? ?Follow-Up: ?Your next appointment:  9-12 month(s) In Person with Glenetta Hew, MD   ? ?Please call our office 2 months in advance to schedule this appointment  :1 ? ?At [facility], you and your health needs are our priority.  As part of our continuing mission to provide you with exceptional heart care, we have created designated Provider Care Teams.  These Care Teams include your primary Cardiologist (physician) and Advanced Practice Providers (APPs -  Physician Assistants and Nurse Practitioners) who all work together to provide you with the care you need, when you need it. ? ?We recommend signing up for the patient portal called "MyChart".  Sign up information is provided on this After Visit Summary.  MyChart is used to connect with patients for Virtual Visits (Telemedicine).  Patients are able to view lab/test results, encounter notes, upcoming appointments, etc.  Non-urgent messages can be sent to your provider as well.   ?To learn more about what you can do with MyChart, go to [email or web address].   ? ? ?Important Information About Sugar ? ? ? ? ? ? ?        6 SALTY THINGS TO AVOID     1,'800MG'$  DAILY ? ? ? ? ? ? ?

## 2021-08-18 ENCOUNTER — Other Ambulatory Visit: Payer: Self-pay | Admitting: Emergency Medicine

## 2021-08-18 DIAGNOSIS — I10 Essential (primary) hypertension: Secondary | ICD-10-CM

## 2021-08-28 DIAGNOSIS — H401113 Primary open-angle glaucoma, right eye, severe stage: Secondary | ICD-10-CM | POA: Diagnosis not present

## 2021-09-19 ENCOUNTER — Other Ambulatory Visit: Payer: Self-pay | Admitting: Internal Medicine

## 2021-09-19 ENCOUNTER — Other Ambulatory Visit: Payer: Self-pay | Admitting: Emergency Medicine

## 2021-09-19 DIAGNOSIS — I10 Essential (primary) hypertension: Secondary | ICD-10-CM

## 2021-09-19 DIAGNOSIS — I429 Cardiomyopathy, unspecified: Secondary | ICD-10-CM

## 2021-09-19 DIAGNOSIS — I110 Hypertensive heart disease with heart failure: Secondary | ICD-10-CM

## 2021-09-19 DIAGNOSIS — E785 Hyperlipidemia, unspecified: Secondary | ICD-10-CM

## 2021-09-24 ENCOUNTER — Ambulatory Visit (INDEPENDENT_AMBULATORY_CARE_PROVIDER_SITE_OTHER): Payer: Medicare HMO

## 2021-09-24 DIAGNOSIS — Z0000 Encounter for general adult medical examination without abnormal findings: Secondary | ICD-10-CM

## 2021-09-24 NOTE — Patient Instructions (Signed)
Nathan Stafford , Thank you for taking time to come for your Medicare Wellness Visit. I appreciate your ongoing commitment to your health goals. Please review the following plan we discussed and let me know if I can assist you in the future.   Screening recommendations/referrals: Colonoscopy: 09/20/2016 Recommended yearly ophthalmology/optometry visit for glaucoma screening and checkup Recommended yearly dental visit for hygiene and checkup  Vaccinations: Influenza vaccine: completed  Pneumococcal vaccine: completed  Tdap vaccine: 04/26/2015 Shingles vaccine: will consider     Advanced directives: yes  Conditions/risks identified: none   Next appointment: none  Preventive Care 76 Years and Older, Male Preventive care refers to lifestyle choices and visits with your health care provider that can promote health and wellness. What does preventive care include? A yearly physical exam. This is also called an annual well check. Dental exams once or twice a year. Routine eye exams. Ask your health care provider how often you should have your eyes checked. Personal lifestyle choices, including: Daily care of your teeth and gums. Regular physical activity. Eating a healthy diet. Avoiding tobacco and drug use. Limiting alcohol use. Practicing safe sex. Taking low doses of aspirin every day. Taking vitamin and mineral supplements as recommended by your health care provider. What happens during an annual well check? The services and screenings done by your health care provider during your annual well check will depend on your age, overall health, lifestyle risk factors, and family history of disease. Counseling  Your health care provider may ask you questions about your: Alcohol use. Tobacco use. Drug use. Emotional well-being. Home and relationship well-being. Sexual activity. Eating habits. History of falls. Memory and ability to understand (cognition). Work and work  Statistician. Screening  You may have the following tests or measurements: Height, weight, and BMI. Blood pressure. Lipid and cholesterol levels. These may be checked every 5 years, or more frequently if you are over 89 years old. Skin check. Lung cancer screening. You may have this screening every year starting at age 74 if you have a 30-pack-year history of smoking and currently smoke or have quit within the past 15 years. Fecal occult blood test (FOBT) of the stool. You may have this test every year starting at age 21. Flexible sigmoidoscopy or colonoscopy. You may have a sigmoidoscopy every 5 years or a colonoscopy every 10 years starting at age 44. Prostate cancer screening. Recommendations will vary depending on your family history and other risks. Hepatitis C blood test. Hepatitis B blood test. Sexually transmitted disease (STD) testing. Diabetes screening. This is done by checking your blood sugar (glucose) after you have not eaten for a while (fasting). You may have this done every 1-3 years. Abdominal aortic aneurysm (AAA) screening. You may need this if you are a current or former smoker. Osteoporosis. You may be screened starting at age 67 if you are at high risk. Talk with your health care provider about your test results, treatment options, and if necessary, the need for more tests. Vaccines  Your health care provider may recommend certain vaccines, such as: Influenza vaccine. This is recommended every year. Tetanus, diphtheria, and acellular pertussis (Tdap, Td) vaccine. You may need a Td booster every 10 years. Zoster vaccine. You may need this after age 72. Pneumococcal 13-valent conjugate (PCV13) vaccine. One dose is recommended after age 66. Pneumococcal polysaccharide (PPSV23) vaccine. One dose is recommended after age 79. Talk to your health care provider about which screenings and vaccines you need and how often you need them.  This information is not intended to replace  advice given to you by your health care provider. Make sure you discuss any questions you have with your health care provider. Document Released: 03/17/2015 Document Revised: 11/08/2015 Document Reviewed: 12/20/2014 Elsevier Interactive Patient Education  2017 [employer] Prevention in the Home Falls can cause injuries. They can happen to people of all ages. There are many things you can do to make your home safe and to help prevent falls. What can I do on the outside of my home? Regularly fix the edges of walkways and driveways and fix any cracks. Remove anything that might make you trip as you walk through a door, such as a raised step or threshold. Trim any bushes or trees on the path to your home. Use bright outdoor lighting. Clear any walking paths of anything that might make someone trip, such as rocks or tools. Regularly check to see if handrails are loose or broken. Make sure that both sides of any steps have handrails. Any raised decks and porches should have guardrails on the edges. Have any leaves, snow, or ice cleared regularly. Use sand or salt on walking paths during winter. Clean up any spills in your garage right away. This includes oil or grease spills. What can I do in the bathroom? Use night lights. Install grab bars by the toilet and in the tub and shower. Do not use towel bars as grab bars. Use non-skid mats or decals in the tub or shower. If you need to sit down in the shower, use a plastic, non-slip stool. Keep the floor dry. Clean up any water that spills on the floor as soon as it happens. Remove soap buildup in the tub or shower regularly. Attach bath mats securely with double-sided non-slip rug tape. Do not have throw rugs and other things on the floor that can make you trip. What can I do in the bedroom? Use night lights. Make sure that you have a light by your bed that is easy to reach. Do not use any sheets or blankets that are too big for your bed.  They should not hang down onto the floor. Have a firm chair that has side arms. You can use this for support while you get dressed. Do not have throw rugs and other things on the floor that can make you trip. What can I do in the kitchen? Clean up any spills right away. Avoid walking on wet floors. Keep items that you use a lot in easy-to-reach places. If you need to reach something above you, use a strong step stool that has a grab bar. Keep electrical cords out of the way. Do not use floor polish or wax that makes floors slippery. If you must use wax, use non-skid floor wax. Do not have throw rugs and other things on the floor that can make you trip. What can I do with my stairs? Do not leave any items on the stairs. Make sure that there are handrails on both sides of the stairs and use them. Fix handrails that are broken or loose. Make sure that handrails are as long as the stairways. Check any carpeting to make sure that it is firmly attached to the stairs. Fix any carpet that is loose or worn. Avoid having throw rugs at the top or bottom of the stairs. If you do have throw rugs, attach them to the floor with carpet tape. Make sure that you have a light switch at the  top of the stairs and the bottom of the stairs. If you do not have them, ask someone to add them for you. What else can I do to help prevent falls? Wear shoes that: Do not have high heels. Have rubber bottoms. Are comfortable and fit you well. Are closed at the toe. Do not wear sandals. If you use a stepladder: Make sure that it is fully opened. Do not climb a closed stepladder. Make sure that both sides of the stepladder are locked into place. Ask someone to hold it for you, if possible. Clearly mark and make sure that you can see: Any grab bars or handrails. First and last steps. Where the edge of each step is. Use tools that help you move around (mobility aids) if they are needed. These  include: Canes. Walkers. Scooters. Crutches. Turn on the lights when you go into a dark area. Replace any light bulbs as soon as they burn out. Set up your furniture so you have a clear path. Avoid moving your furniture around. If any of your floors are uneven, fix them. If there are any pets around you, be aware of where they are. Review your medicines with your doctor. Some medicines can make you feel dizzy. This can increase your chance of falling. Ask your doctor what other things that you can do to help prevent falls. This information is not intended to replace advice given to you by your health care provider. Make sure you discuss any questions you have with your health care provider. Document Released: 12/15/2008 Document Revised: 07/27/2015 Document Reviewed: 03/25/2014 Elsevier Interactive Patient Education  2017 [employer].

## 2021-09-24 NOTE — Progress Notes (Signed)
Subjective:   Nathan Stafford is a 71 y.o. male who presents for an Subsequent l Medicare Annual Wellness Visit.   I connected with Nathan Stafford  today by telephone and verified that I am speaking with the correct person using two identifiers. Location patient: home Location provider: work Persons participating in the virtual visit: patient, provider.   I discussed the limitations, risks, security and privacy concerns of performing an evaluation and management service by telephone and the availability of in person appointments. I also discussed with the patient that there may be a patient responsible charge related to this service. The patient expressed understanding and verbally consented to this telephonic visit.    Interactive audio and video telecommunications were attempted between this provider and patient, however failed, due to patient having technical difficulties OR patient did not have access to video capability.  We continued and completed visit with audio only.    Review of Systems     Cardiac Risk Factors include: advanced age (>52mn, >>13women);male gender;dyslipidemia;hypertension     Objective:    Today's Vitals   There is no height or weight on file to calculate BMI.     09/24/2021    3:40 PM 12/13/2019   12:19 PM 09/04/2019    7:25 AM 04/12/2019    6:12 PM 08/03/2018    9:02 AM 07/06/2016    3:02 AM 05/02/2016    8:46 AM  Advanced Directives  Does Patient Have a Medical Advance Directive? Yes No No No No No No  [place]        Copy of [place]? No - copy requested        Would patient like information on creating a medical advance directive?  Yes (ED - Information included in AVS)  No - Patient declined No - Patient declined No - Patient declined Yes (ED - Information included in AVS)    Current Medications (verified) Outpatient Encounter Medications as of 09/24/2021  Medication  Sig   amLODipine (NORVASC) 10 MG tablet Take 1 tablet by mouth once daily   aspirin 81 MG tablet Take 1 tablet (81 mg total) by mouth daily.   brimonidine (ALPHAGAN) 0.2 % ophthalmic solution Place 1 drop into both eyes 2 (two) times daily.    dorzolamide-timolol (COSOPT) 22.3-6.8 MG/ML ophthalmic solution Place 1 drop into both eyes 2 (two) times daily.    hydrochlorothiazide (HYDRODIURIL) 25 MG tablet Take 1 tablet by mouth once daily   Omega-3 Fatty Acids (FISH OIL PO) Take by mouth as needed.   sildenafil (VIAGRA) 50 MG tablet TAKE 1 TABLET BY MOUTH ONCE DAILY AS NEEDED FOR ERECTILE DYSFUNCTION   simvastatin (ZOCOR) 20 MG tablet TAKE 1 TABLET BY MOUTH AT BEDTIME   metoprolol succinate (TOPROL-XL) 100 MG 24 hr tablet Take 1 tablet (100 mg total) by mouth 2 (two) times daily. Take with or immediately following a meal.   No facility-administered encounter medications on file as of 09/24/2021.    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   GERD (gastroesophageal reflux disease)    Glaucoma    Hematuria    HLD (hyperlipidemia)    HTN (hypertension)    Perinephric hematoma  Past Surgical History:  Procedure Laterality Date   colonscopy     EYE SURGERY Right 1993   LASIK     Family History  Problem Relation Age of Onset   Diabetes Father    Social History   Socioeconomic History   Marital status: Married    Spouse name: Not on file   Number of children: 1   Years of education: Not on file   Highest education level: Not on file  Occupational History   Occupation: Receiving Armed forces operational officer: [employer]  Tobacco Use   Smoking status: Every Day    Packs/day: 0.50    Years: 40.00    Total pack years: 20.00     Types: Cigarettes   Smokeless tobacco: Never  Substance and Sexual Activity   Alcohol use: Yes    Alcohol/week: 35.0 standard drinks of alcohol    Types: 35 Cans of beer per week   Drug use: No   Sexual activity: Yes    Partners: Female  Other Topics Concern   Not on file  Social History Narrative   Lives with his wife.   Social Determinants of Health   Financial Resource Strain: Low Risk  (09/24/2021)   Overall Financial Resource Strain (CARDIA)    Difficulty of Paying Living Expenses: Not hard at all  Food Insecurity: No Food Insecurity (09/24/2021)   Hunger Vital Sign    Worried About Running Out of Food in the Last Year: Never true    Ran Out of Food in the Last Year: Never true  Transportation Needs: No Transportation Needs (09/24/2021)   PRAPARE - Hydrologist (Medical): No    Lack of Transportation (Non-Medical): No  Physical Activity: Insufficiently Active (09/24/2021)   Exercise Vital Sign    Days of Exercise per Week: 3 days    Minutes of Exercise per Session: 30 min  Stress: No Stress Concern Present (09/24/2021)   [place]    Feeling of Stress : Not at all  Social Connections: Socially Isolated (09/24/2021)   Social Connection and Isolation Panel [NHANES]    Frequency of Communication with Friends and Family: Three times a week    Frequency of Social Gatherings with Friends and Family: Three times a week    Attends Religious Services: Never    Active Member of Clubs or Organizations: No    Attends Archivist Meetings: Never    Marital Status: Separated    Tobacco Counseling Ready to quit: Not Answered Counseling given: Not Answered   Clinical Intake:  Pre-visit preparation completed: Yes  Pain : No/denies pain     Nutritional Risks: None Diabetes: No  How often do you need to have someone help you when you read instructions, pamphlets, or other  written materials from your doctor or pharmacy?: 1 - Never What is the last grade level you completed in school?: 11th grade  Diabetic?no   Interpreter Needed?: No  Information entered by :: L.Wilson,LPn   Activities of Daily Living    09/24/2021    3:45 PM  In your present state of health, do you have any difficulty performing the following activities:  Hearing? 0  Vision? 0  Difficulty concentrating or making decisions? 0  Walking or climbing stairs? 0  Dressing or bathing? 0  Doing errands, shopping? 0  Preparing Food and eating ? N  Using the Toilet? N  In the past six months, have  you accidently leaked urine? N  Do you have problems with loss of bowel control? N  Managing your Medications? N  Managing your Finances? N  Housekeeping or managing your Housekeeping? N    Patient Care Team: Janith Lima, MD as PCP - General (Internal Medicine) Leonie Man, MD as PCP - Cardiology (Cardiology)  Indicate any recent Medical Services you may have received from other than [facility] providers in the past year (date may be approximate).     Assessment:   This is a routine wellness examination for [place].  Hearing/Vision screen Vision Screening - Comments:: Annual eye exams   Dietary issues and exercise activities discussed: Current Exercise Habits: Home exercise routine, Type of exercise: walking, Time (Minutes): 30, Frequency (Times/Week): 3, Weekly Exercise (Minutes/Week): 90, Intensity: Mild, Exercise limited by: None identified   Goals Addressed   None    Depression Screen    09/24/2021    3:44 PM 09/24/2021    3:39 PM 05/11/2020    9:38 AM 12/13/2019   12:14 PM 08/03/2018    9:04 AM 05/29/2018   10:21 AM 09/26/2017    8:11 AM  PHQ 2/9 Scores  PHQ - 2 Score 0 0 0 0 0 0 0    Fall Risk    09/24/2021    3:41 PM 05/11/2020    9:38 AM 03/08/2020    2:50 PM 12/13/2019   12:13 PM 09/21/2019    1:51 PM  [place] in the past year? '1 1 1 1 '$ 0  Number falls in  past yr: 0 0 1 0 0  Comment    not sure why fell   Injury with Fall? 0 0 0 0 0  Follow up Falls evaluation completed;Education provided Falls evaluation completed Falls evaluation completed Falls evaluation completed;Education provided Falls evaluation completed  Comment cane        FALL RISK PREVENTION PERTAINING TO THE HOME:  Any stairs in or around the home? No  If so, are there any without handrails? No  Home free of loose throw rugs in walkways, pet beds, electrical cords, etc? Yes  Adequate lighting in your home to reduce risk of falls? Yes   ASSISTIVE DEVICES UTILIZED TO PREVENT FALLS:  Life alert? Yes  Use of a cane, walker or w/c? Yes  Grab bars in the bathroom? Yes  Shower chair or bench in shower? Yes  Elevated toilet seat or a handicapped toilet? Yes   Cognitive Function:    Normal cognitive status assessed by telephone conversation  by this Nurse Health Advisor. No abnormalities found.      12/13/2019   12:11 PM 08/03/2018    9:06 AM  6CIT Screen  What Year? 0 points 0 points  What month? 0 points 0 points  What time? 0 points 0 points  Count back from 20 0 points 0 points  Months in reverse 0 points 0 points  Repeat phrase 0 points 0 points  Total Score 0 points 0 points    Immunizations Immunization History  Administered Date(s) Administered   Influenza-Unspecified 12/07/2019   PFIZER(Purple Top)SARS-COV-2 Vaccination 05/28/2019, 06/18/2019   Pneumococcal Conjugate-13 04/26/2015    TDAP status: Up to date  Flu Vaccine status: Up to date  Pneumococcal vaccine status: Up to date  Covid-19 vaccine status: Completed vaccines  Qualifies for Shingles Vaccine? Yes   Zostavax completed No   Shingrix Completed?: No.    Education has been provided regarding the importance of this vaccine. Patient  has been advised to call insurance company to determine out of pocket expense if they have not yet received this vaccine. Advised may also receive vaccine at  local pharmacy or Health Dept. Verbalized acceptance and understanding.  Screening Tests Health Maintenance  Topic Date Due   TETANUS/TDAP  Never done   Zoster Vaccines- Shingrix (1 of 2) Never done   Pneumonia Vaccine 14+ Years old (2 - PPSV23 or PCV20) 06/21/2015   COVID-19 Vaccine (3 - Pfizer series) 08/13/2019   INFLUENZA VACCINE  10/02/2021   COLONOSCOPY (Pts 45-72yr Insurance coverage will need to be confirmed)  09/21/2026   Hepatitis C Screening  Completed   HPV VACCINES  Aged Out    Health Maintenance  Health Maintenance Due  Topic Date Due   TETANUS/TDAP  Never done   Zoster Vaccines- Shingrix (1 of 2) Never done   Pneumonia Vaccine 72 Years old (2 - PPSV23 or PCV20) 06/21/2015   COVID-19 Vaccine (3 - Pfizer series) 08/13/2019    Colorectal cancer screening: Type of screening: Colonoscopy. Completed 09/20/2016. Repeat every 10 years  Lung Cancer Screening: (Low Dose CT Chest recommended if Age 72-80years, 30 pack-year currently smoking OR have quit w/in 15years.) does not qualify.   Lung Cancer Screening Referral: n/a  Additional Screening:  Hepatitis C Screening: does not qualify;   Vision Screening: Recommended annual ophthalmology exams for early detection of glaucoma and other disorders of the eye. Is the patient up to date with their annual eye exam?  Yes  Who is the provider or what is the name of the office in which the patient attends annual eye exams? [facility]  If pt is not established with a provider, would they like to be referred to a provider to establish care? No .   Dental Screening: Recommended annual dental exams for proper oral hygiene  Community Resource Referral / Chronic Care Management: CRR required this visit?  No   CCM required this visit?  No      Plan:     I have personally reviewed and noted the following in the patient's chart:   Medical and social history Use of alcohol, tobacco or illicit drugs  Current medications and  supplements including opioid prescriptions. Patient is not currently taking opioid prescriptions. Functional ability and status Nutritional status Physical activity Advanced directives List of other physicians Hospitalizations, surgeries, and ER visits in previous 12 months Vitals Screenings to include cognitive, depression, and falls Referrals and appointments  In addition, I have reviewed and discussed with patient certain preventive protocols, quality metrics, and best practice recommendations. A written personalized care plan for preventive services as well as general preventive health recommendations were provided to patient.     LRandel Pigg LPN   [id number]  Nurse Notes: none

## 2021-11-13 ENCOUNTER — Ambulatory Visit (INDEPENDENT_AMBULATORY_CARE_PROVIDER_SITE_OTHER): Payer: Medicare HMO | Admitting: Internal Medicine

## 2021-11-13 ENCOUNTER — Encounter: Payer: Self-pay | Admitting: Internal Medicine

## 2021-11-13 VITALS — BP 138/66 | HR 68 | Temp 98.2°F | Ht 73.0 in | Wt 193.0 lb

## 2021-11-13 DIAGNOSIS — E876 Hypokalemia: Secondary | ICD-10-CM | POA: Diagnosis not present

## 2021-11-13 DIAGNOSIS — I10 Essential (primary) hypertension: Secondary | ICD-10-CM

## 2021-11-13 DIAGNOSIS — R69 Illness, unspecified: Secondary | ICD-10-CM | POA: Diagnosis not present

## 2021-11-13 DIAGNOSIS — R7303 Prediabetes: Secondary | ICD-10-CM | POA: Diagnosis not present

## 2021-11-13 DIAGNOSIS — N400 Enlarged prostate without lower urinary tract symptoms: Secondary | ICD-10-CM | POA: Diagnosis not present

## 2021-11-13 DIAGNOSIS — E7849 Other hyperlipidemia: Secondary | ICD-10-CM | POA: Diagnosis not present

## 2021-11-13 DIAGNOSIS — T502X5A Adverse effect of carbonic-anhydrase inhibitors, benzothiadiazides and other diuretics, initial encounter: Secondary | ICD-10-CM

## 2021-11-13 DIAGNOSIS — F17200 Nicotine dependence, unspecified, uncomplicated: Secondary | ICD-10-CM

## 2021-11-13 DIAGNOSIS — Z0000 Encounter for general adult medical examination without abnormal findings: Secondary | ICD-10-CM | POA: Diagnosis not present

## 2021-11-13 DIAGNOSIS — Z0001 Encounter for general adult medical examination with abnormal findings: Secondary | ICD-10-CM

## 2021-11-13 LAB — HEPATIC FUNCTION PANEL
ALT: 22 U/L (ref 0–53)
AST: 31 U/L (ref 0–37)
Albumin: 4 g/dL (ref 3.5–5.2)
Alkaline Phosphatase: 66 U/L (ref 39–117)
Bilirubin, Direct: 0.1 mg/dL (ref 0.0–0.3)
Total Bilirubin: 0.3 mg/dL (ref 0.2–1.2)
Total Protein: 7.1 g/dL (ref 6.0–8.3)

## 2021-11-13 LAB — CBC WITH DIFFERENTIAL/PLATELET
Basophils Absolute: 0 10*3/uL (ref 0.0–0.1)
Basophils Relative: 0.4 % (ref 0.0–3.0)
Eosinophils Absolute: 0.1 10*3/uL (ref 0.0–0.7)
Eosinophils Relative: 1 % (ref 0.0–5.0)
HCT: 42.5 % (ref 39.0–52.0)
Hemoglobin: 14.1 g/dL (ref 13.0–17.0)
Lymphocytes Relative: 26.8 % (ref 12.0–46.0)
Lymphs Abs: 1.7 10*3/uL (ref 0.7–4.0)
MCHC: 33.1 g/dL (ref 30.0–36.0)
MCV: 89.1 fl (ref 78.0–100.0)
Monocytes Absolute: 0.7 10*3/uL (ref 0.1–1.0)
Monocytes Relative: 10.6 % (ref 3.0–12.0)
Neutro Abs: 3.9 10*3/uL (ref 1.4–7.7)
Neutrophils Relative %: 61.2 % (ref 43.0–77.0)
Platelets: 152 10*3/uL (ref 150.0–400.0)
RBC: 4.77 Mil/uL (ref 4.22–5.81)
RDW: 13.3 % (ref 11.5–15.5)
WBC: 6.3 10*3/uL (ref 4.0–10.5)

## 2021-11-13 LAB — BASIC METABOLIC PANEL
BUN: 16 mg/dL (ref 6–23)
CO2: 28 mEq/L (ref 19–32)
Calcium: 9.5 mg/dL (ref 8.4–10.5)
Chloride: 102 mEq/L (ref 96–112)
Creatinine, Ser: 0.92 mg/dL (ref 0.40–1.50)
GFR: 83.14 mL/min (ref >60.00)
Glucose, Bld: 206 mg/dL — ABNORMAL HIGH (ref 70–99)
Potassium: 3.4 mEq/L — ABNORMAL LOW (ref 3.5–5.1)
Sodium: 138 mEq/L (ref 135–145)

## 2021-11-13 LAB — LIPID PANEL
Cholesterol: 130 mg/dL (ref 0–200)
HDL: 38.9 mg/dL — ABNORMAL LOW (ref >39.00)
LDL Cholesterol: 72 mg/dL (ref 0–99)
NonHDL: 91.3
Total CHOL/HDL Ratio: 3
Triglycerides: 99 mg/dL (ref 0.0–149.0)
VLDL: 19.8 mg/dL (ref 0.0–40.0)

## 2021-11-13 LAB — TSH: TSH: 1.96 u[IU]/mL (ref 0.35–5.50)

## 2021-11-13 LAB — PSA: PSA: 1.17 ng/mL (ref 0.10–4.00)
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
## 2021-11-13 NOTE — Patient Instructions (Signed)
Health Maintenance, Male Adopting a healthy lifestyle and getting preventive care are important in promoting health and wellness. Ask your health care provider about: The right schedule for you to have regular tests and exams. Things you can do on your own to prevent diseases and keep yourself healthy. What should I know about diet, weight, and exercise? Eat a healthy diet  Eat a diet that includes plenty of vegetables, fruits, low-fat dairy products, and lean protein. Do not eat a lot of foods that are high in solid fats, added sugars, or sodium. Maintain a healthy weight Body mass index (BMI) is a measurement that can be used to identify possible weight problems. It estimates body fat based on height and weight. Your health care provider can help determine your BMI and help you achieve or maintain a healthy weight. Get regular exercise Get regular exercise. This is one of the most important things you can do for your health. Most adults should: Exercise for at least 150 minutes each week. The exercise should increase your heart rate and make you sweat (moderate-intensity exercise). Do strengthening exercises at least twice a week. This is in addition to the moderate-intensity exercise. Spend less time sitting. Even light physical activity can be beneficial. Watch cholesterol and blood lipids Have your blood tested for lipids and cholesterol at 72 years of age, then have this test every 5 years. You may need to have your cholesterol levels checked more often if: Your lipid or cholesterol levels are high. You are older than 72 years of age. You are at high risk for heart disease. What should I know about cancer screening? Many types of cancers can be detected early and may often be prevented. Depending on your health history and family history, you may need to have cancer screening at various ages. This may include screening for: Colorectal cancer. Prostate cancer. Skin cancer. Lung  cancer. What should I know about heart disease, diabetes, and high blood pressure? Blood pressure and heart disease High blood pressure causes heart disease and increases the risk of stroke. This is more likely to develop in people who have high blood pressure readings or are overweight. Talk with your health care provider about your target blood pressure readings. Have your blood pressure checked: Every 3-5 years if you are 18-39 years of age. Every year if you are 40 years old or older. If you are between the ages of 65 and 75 and are a current or former smoker, ask your health care provider if you should have a one-time screening for abdominal aortic aneurysm (AAA). Diabetes Have regular diabetes screenings. This checks your fasting blood sugar level. Have the screening done: Once every three years after age 45 if you are at a normal weight and have a low risk for diabetes. More often and at a younger age if you are overweight or have a high risk for diabetes. What should I know about preventing infection? Hepatitis B If you have a higher risk for hepatitis B, you should be screened for this virus. Talk with your health care provider to find out if you are at risk for hepatitis B infection. Hepatitis C Blood testing is recommended for: Everyone born from 1945 through 1965. Anyone with known risk factors for hepatitis C. Sexually transmitted infections (STIs) You should be screened each year for STIs, including gonorrhea and chlamydia, if: You are sexually active and are younger than 72 years of age. You are older than 72 years of age and your   health care provider tells you that you are at risk for this type of infection. Your sexual activity has changed since you were last screened, and you are at increased risk for chlamydia or gonorrhea. Ask your health care provider if you are at risk. Ask your health care provider about whether you are at high risk for HIV. Your health care provider  may recommend a prescription medicine to help prevent HIV infection. If you choose to take medicine to prevent HIV, you should first get tested for HIV. You should then be tested every 3 months for as long as you are taking the medicine. Follow these instructions at home: Alcohol use Do not drink alcohol if your health care provider tells you not to drink. If you drink alcohol: Limit how much you have to 0-2 drinks a day. Know how much alcohol is in your drink. In the [place] one drink equals one 12 oz bottle of beer (355 mL), one 5 oz glass of wine (148 mL), or one 1 oz glass of hard liquor (44 mL). Lifestyle Do not use any products that contain nicotine or tobacco. These products include cigarettes, chewing tobacco, and vaping devices, such as e-cigarettes. If you need help quitting, ask your health care provider. Do not use street drugs. Do not share needles. Ask your health care provider for help if you need support or information about quitting drugs. General instructions Schedule regular health, dental, and eye exams. Stay current with your vaccines. Tell your health care provider if: You often feel depressed. You have ever been abused or do not feel safe at home. Summary Adopting a healthy lifestyle and getting preventive care are important in promoting health and wellness. Follow your health care provider's instructions about healthy diet, exercising, and getting tested or screened for diseases. Follow your health care provider's instructions on monitoring your cholesterol and blood pressure. This information is not intended to replace advice given to you by your health care provider. Make sure you discuss any questions you have with your health care provider. Document Revised: 07/10/2020 Document Reviewed: 07/10/2020 Elsevier Patient Education  2023 Elsevier Inc.  

## 2021-11-14 DIAGNOSIS — T502X5A Adverse effect of carbonic-anhydrase inhibitors, benzothiadiazides and other diuretics, initial encounter: Secondary | ICD-10-CM | POA: Insufficient documentation

## 2021-11-14 DIAGNOSIS — E876 Hypokalemia: Secondary | ICD-10-CM | POA: Insufficient documentation

## 2021-11-14 LAB — URINALYSIS, ROUTINE W REFLEX MICROSCOPIC
Bilirubin Urine: NEGATIVE
Hgb urine dipstick: NEGATIVE
Ketones, ur: NEGATIVE
Leukocytes,Ua: NEGATIVE
Nitrite: NEGATIVE
RBC / HPF: NONE SEEN (ref 0–?)
Specific Gravity, Urine: 1.01 (ref 1.000–1.030)
Total Protein, Urine: NEGATIVE
Urine Glucose: >=1000 — AB
Urobilinogen, UA: 0.2 (ref 0.0–1.0)
WBC, UA: NONE SEEN (ref 0–?)
pH: 6 (ref 5.0–8.0)

## 2021-11-14 LAB — HEMOGLOBIN A1C: Hgb A1c MFr Bld: 5.9 % (ref 4.6–6.5)

## 2021-11-14 MED ORDER — POTASSIUM CHLORIDE CRYS ER 15 MEQ PO TBCR: 15.0000 meq | EXTENDED_RELEASE_TABLET | Freq: Two times a day (BID) | ORAL | 0 refills | Status: DC

## 2021-11-18 ENCOUNTER — Encounter: Payer: Self-pay | Admitting: Internal Medicine

## 2021-11-18 MED ORDER — SHINGRIX 50 MCG/0.5ML IM SUSR: 0.5000 mL | Freq: Once | INTRAMUSCULAR | 1 refills | Status: AC

## 2021-11-18 MED ORDER — BOOSTRIX 5-2.5-18.5 LF-MCG/0.5 IM SUSP: 0.5000 mL | Freq: Once | INTRAMUSCULAR | 0 refills | Status: AC

## 2021-11-23 ENCOUNTER — Other Ambulatory Visit: Payer: Self-pay | Admitting: Emergency Medicine

## 2021-11-23 DIAGNOSIS — I10 Essential (primary) hypertension: Secondary | ICD-10-CM

## 2021-12-07 DIAGNOSIS — H5203 Hypermetropia, bilateral: Secondary | ICD-10-CM | POA: Diagnosis not present

## 2021-12-27 ENCOUNTER — Other Ambulatory Visit: Payer: Self-pay | Admitting: Internal Medicine

## 2021-12-27 DIAGNOSIS — E785 Hyperlipidemia, unspecified: Secondary | ICD-10-CM

## 2022-02-15 ENCOUNTER — Other Ambulatory Visit: Payer: Self-pay | Admitting: Internal Medicine

## 2022-02-15 DIAGNOSIS — I110 Hypertensive heart disease with heart failure: Secondary | ICD-10-CM

## 2022-02-15 DIAGNOSIS — I429 Cardiomyopathy, unspecified: Secondary | ICD-10-CM

## 2022-02-15 DIAGNOSIS — I10 Essential (primary) hypertension: Secondary | ICD-10-CM

## 2022-02-26 ENCOUNTER — Other Ambulatory Visit: Payer: Self-pay | Admitting: Internal Medicine

## 2022-02-26 DIAGNOSIS — I10 Essential (primary) hypertension: Secondary | ICD-10-CM

## 2022-04-25 DIAGNOSIS — H401121 Primary open-angle glaucoma, left eye, mild stage: Secondary | ICD-10-CM | POA: Diagnosis not present

## 2022-04-28 ENCOUNTER — Emergency Department (HOSPITAL_COMMUNITY): Payer: Medicare HMO

## 2022-04-28 ENCOUNTER — Other Ambulatory Visit: Payer: Self-pay

## 2022-04-28 ENCOUNTER — Emergency Department (HOSPITAL_COMMUNITY)
Admission: EM | Admit: 2022-04-28 | Discharge: 2022-04-28 | Disposition: A | Discharge status: Home / Self Care | Payer: Medicare HMO | Attending: Emergency Medicine | Admitting: Emergency Medicine

## 2022-04-28 ENCOUNTER — Encounter (HOSPITAL_COMMUNITY): Payer: Self-pay | Admitting: Emergency Medicine

## 2022-04-28 DIAGNOSIS — I4510 Unspecified right bundle-branch block: Secondary | ICD-10-CM | POA: Diagnosis not present

## 2022-04-28 DIAGNOSIS — R5383 Other fatigue: Secondary | ICD-10-CM | POA: Diagnosis not present

## 2022-04-28 DIAGNOSIS — I10 Essential (primary) hypertension: Secondary | ICD-10-CM | POA: Diagnosis not present

## 2022-04-28 DIAGNOSIS — Z7982 Long term (current) use of aspirin: Secondary | ICD-10-CM | POA: Insufficient documentation

## 2022-04-28 DIAGNOSIS — Z79899 Other long term (current) drug therapy: Secondary | ICD-10-CM | POA: Diagnosis not present

## 2022-04-28 DIAGNOSIS — R55 Syncope and collapse: Secondary | ICD-10-CM | POA: Insufficient documentation

## 2022-04-28 DIAGNOSIS — R001 Bradycardia, unspecified: Secondary | ICD-10-CM | POA: Diagnosis not present

## 2022-04-28 DIAGNOSIS — Z743 Need for continuous supervision: Secondary | ICD-10-CM | POA: Diagnosis not present

## 2022-04-28 LAB — CBC WITH DIFFERENTIAL/PLATELET
Abs Immature Granulocytes: 0.05 K/uL (ref 0.00–0.07)
Basophils Absolute: 0 K/uL (ref 0.0–0.1)
Basophils Relative: 0 %
Eosinophils Absolute: 0.1 K/uL (ref 0.0–0.5)
Eosinophils Relative: 1 %
HCT: 40.6 % (ref 39.0–52.0)
Hemoglobin: 13.7 g/dL (ref 13.0–17.0)
Immature Granulocytes: 1 %
Lymphocytes Relative: 34 %
Lymphs Abs: 3.1 K/uL (ref 0.7–4.0)
MCH: 28.8 pg (ref 26.0–34.0)
MCHC: 33.7 g/dL (ref 30.0–36.0)
MCV: 85.5 fL (ref 80.0–100.0)
Monocytes Absolute: 0.9 K/uL (ref 0.1–1.0)
Monocytes Relative: 10 %
Neutro Abs: 4.9 K/uL (ref 1.7–7.7)
Neutrophils Relative %: 54 %
Platelets: 186 K/uL (ref 150–400)
RBC: 4.75 MIL/uL (ref 4.22–5.81)
RDW: 14.2 % (ref 11.5–15.5)
WBC: 9 K/uL (ref 4.0–10.5)
nRBC: 0 % (ref 0.0–0.2)

## 2022-04-28 LAB — COMPREHENSIVE METABOLIC PANEL WITH GFR
ALT: 12 U/L (ref 0–44)
AST: 17 U/L (ref 15–41)
Albumin: 3.6 g/dL (ref 3.5–5.0)
Alkaline Phosphatase: 58 U/L (ref 38–126)
Anion gap: 14 (ref 5–15)
BUN: 10 mg/dL (ref 8–23)
CO2: 20 mmol/L — ABNORMAL LOW (ref 22–32)
Calcium: 8.7 mg/dL — ABNORMAL LOW (ref 8.9–10.3)
Chloride: 104 mmol/L (ref 98–111)
Creatinine, Ser: 0.98 mg/dL (ref 0.61–1.24)
GFR, Estimated: >60 mL/min
Glucose, Bld: 186 mg/dL — ABNORMAL HIGH (ref 70–99)
Potassium: 2.8 mmol/L — ABNORMAL LOW (ref 3.5–5.1)
Sodium: 138 mmol/L (ref 135–145)
Total Bilirubin: 0.5 mg/dL (ref 0.3–1.2)
Total Protein: 6.6 g/dL (ref 6.5–8.1)

## 2022-04-28 LAB — ETHANOL: Alcohol, Ethyl (B): 161 mg/dL — ABNORMAL HIGH

## 2022-04-28 LAB — TROPONIN I (HIGH SENSITIVITY): Troponin I (High Sensitivity): 5 ng/L (ref <18)

## 2022-04-28 LAB — TSH: TSH: 3.59 u[IU]/mL (ref 0.350–4.500)

## 2022-04-28 MED ORDER — SODIUM CHLORIDE 0.9 % IV BOLUS
1000.0000 mL | Freq: Once | INTRAVENOUS | Status: AC
  Administered 2022-04-28: 1000 mL via INTRAVENOUS

## 2022-04-28 NOTE — ED Triage Notes (Signed)
Pt bib [facility] for syncopal episode. Pt was lowered to the ground by family - no trauma during event. Pt endorses etoh, cocaine, and Viagra approx 1 hour pta. Pinpoint pupils reported by ems. EKG shows RBBB. 326m NS given PTA.   HR 48-52, BP 127/68, Spo2 98%, CBG 162

## 2022-04-28 NOTE — ED Provider Notes (Signed)
[facility] Provider Note   CSN: [id number] Arrival date & time: 04/28/22  2053     History  Chief Complaint  Patient presents with   Loss of Consciousness    Nathan Stafford is a 72 y.o. male.  The history is provided by the patient and medical records. No language interpreter was used.  Loss of Consciousness Episode history:  Single (partial per pt) Most recent episode:  Today Timing:  Rare Progression:  Resolved Chronicity:  Recurrent Context: dehydration   Context comment:  Cocainem etoh, and viagra Witnessed: yes   Relieved by:  Nothing Worsened by:  Nothing Ineffective treatments:  None tried Associated symptoms: no chest pain, no confusion, no difficulty breathing, no fever, no headaches, no malaise/fatigue, no nausea, no palpitations, no recent surgery, no rectal bleeding, no seizures, no shortness of breath and no vomiting        Home Medications Prior to Admission medications   Medication Sig Start Date End Date Taking? Authorizing Provider  amLODipine (NORVASC) 10 MG tablet Take 1 tablet by mouth once daily 02/15/22   Janith Lima, MD  aspirin 81 MG tablet Take 1 tablet (81 mg total) by mouth daily. 05/09/17   Tereasa Coop, PA-C  brimonidine (ALPHAGAN) 0.2 % ophthalmic solution Place 1 drop into both eyes 2 (two) times daily.     [provider]  dorzolamide-timolol (COSOPT) 22.3-6.8 MG/ML ophthalmic solution Place 1 drop into both eyes 2 (two) times daily.     [provider]  hydrochlorothiazide (HYDRODIURIL) 25 MG tablet Take 1 tablet by mouth once daily 02/26/22   Janith Lima, MD  metoprolol succinate (TOPROL-XL) 100 MG 24 hr tablet Take 1 tablet (100 mg total) by mouth 2 (two) times daily. Take with or immediately following a meal. 06/25/21 09/23/21  Cleaver, Jossie Ng, NP  Omega-3 Fatty Acids (FISH OIL PO) Take by mouth as needed.    [provider]  potassium chloride SA (KLOR-CON  M15) 15 MEQ tablet Take 1 tablet (15 mEq total) by mouth 2 (two) times daily. 11/14/21   Janith Lima, MD  sildenafil (VIAGRA) 50 MG tablet TAKE 1 TABLET BY MOUTH ONCE DAILY AS NEEDED FOR ERECTILE DYSFUNCTION 03/26/21   Horald Pollen, MD  simvastatin (ZOCOR) 20 MG tablet TAKE 1 TABLET BY MOUTH AT BEDTIME 12/27/21   Janith Lima, MD      Allergies    Ace inhibitors    Review of Systems   Review of Systems  Constitutional:  Negative for chills, fatigue, fever and malaise/fatigue.  HENT:  Negative for congestion.   Eyes:  Negative for visual disturbance.  Respiratory:  Negative for cough, chest tightness, shortness of breath and wheezing.   Cardiovascular:  Positive for syncope. Negative for chest pain, palpitations and leg swelling.  Gastrointestinal:  Negative for abdominal pain, constipation, diarrhea, nausea and vomiting.  Genitourinary:  Negative for dysuria and flank pain.  Musculoskeletal:  Negative for back pain and neck pain.  Skin:  Negative for rash and wound.  Neurological:  Positive for syncope (near syncope per pt) and light-headedness. Negative for seizures and headaches.  Psychiatric/Behavioral:  Negative for agitation and confusion.   All other systems reviewed and are negative.   Physical Exam Updated Vital Signs There were no vitals taken for this visit. Physical Exam Vitals and nursing note reviewed.  Constitutional:      General: He is not in acute distress.    Appearance: He  is well-developed. He is not ill-appearing, toxic-appearing or diaphoretic.  HENT:     Head: Normocephalic and atraumatic.     Nose: Nose normal. No congestion or rhinorrhea.     Mouth/Throat:     Mouth: Mucous membranes are dry.     Pharynx: No oropharyngeal exudate or posterior oropharyngeal erythema.  Eyes:     Extraocular Movements: Extraocular movements intact.     Conjunctiva/sclera: Conjunctivae normal.     Pupils: Pupils are equal, round, and reactive to light.   Cardiovascular:     Rate and Rhythm: Normal rate and regular rhythm.     Heart sounds: No murmur heard. Pulmonary:     Effort: Pulmonary effort is normal. No respiratory distress.     Breath sounds: Normal breath sounds. No wheezing, rhonchi or rales.  Chest:     Chest wall: No tenderness.  Abdominal:     General: Abdomen is flat.     Palpations: Abdomen is soft.     Tenderness: There is no abdominal tenderness. There is no right CVA tenderness, left CVA tenderness, guarding or rebound.  Musculoskeletal:        General: No swelling, tenderness or signs of injury.     Cervical back: Neck supple. No tenderness.     Right lower leg: No edema.     Left lower leg: No edema.  Skin:    General: Skin is warm and dry.     Capillary Refill: Capillary refill takes less than 2 seconds.     Findings: No erythema or rash.  Neurological:     General: No focal deficit present.     Mental Status: He is alert.     Sensory: No sensory deficit.     Motor: No weakness.  Psychiatric:        Mood and Affect: Mood normal.     ED Results / Procedures / Treatments   Labs (all labs ordered are listed, but only abnormal results are displayed) Labs Reviewed  COMPREHENSIVE METABOLIC PANEL - Abnormal; Notable for the following components:      Result Value   Potassium 2.8 (*)    CO2 20 (*)    Glucose, Bld 186 (*)    Calcium 8.7 (*)    All other components within normal limits  ETHANOL - Abnormal; Notable for the following components:   Alcohol, Ethyl (B) 161 (*)    All other components within normal limits  CBC WITH DIFFERENTIAL/PLATELET  TSH  URINALYSIS, ROUTINE W REFLEX MICROSCOPIC  RAPID URINE DRUG SCREEN, HOSP PERFORMED  TROPONIN I (HIGH SENSITIVITY)  TROPONIN I (HIGH SENSITIVITY)    EKG EKG Interpretation  Date/Time:  Sunday April 28 2022 20:59:35 EST Ventricular Rate:  50 PR Interval:  200 QRS Duration: 198 QT Interval:  564 QTC Calculation: 515 R Axis:   -71 Text  Interpretation: Sinus rhythm RBBB and LAFB Left ventricular hypertrophy when compared to prior, simiular appearnce. No STEMI Confirmed by Antony Blackbird [phone]) on 04/28/2022 9:11:37 PM  Radiology DG Chest Portable 1 View  Result Date: 04/28/2022 CLINICAL DATA:  Near syncope after combination of alcohol, cocaine, and Viagra. Now feeling fatigued. EXAM: PORTABLE CHEST 1 VIEW COMPARISON:  None Available. FINDINGS: Lung volumes are low. Heart is prominent in size, however likely accentuated by low volumes and AP technique. Bronchovascular crowding versus vascular congestion. No focal airspace disease. Mild biapical pleuroparenchymal scarring. No pleural effusion or pneumothorax. No acute osseous abnormalities IMPRESSION: 1. Low lung volumes with bronchovascular crowding versus vascular congestion. 2.  Prominent heart size, likely accentuated by low volumes and AP technique. Electronically Signed   By: Keith Rake M.D.   On: 04/28/2022 21:44    Procedures Procedures    Medications Ordered in ED Medications  sodium chloride 0.9 % bolus 1,000 mL (0 mLs Intravenous Stopped 04/28/22 2311)    ED Course/ Medical Decision Making/ A&P                             Medical Decision Making Amount and/or Complexity of Data Reviewed Labs: ordered. Radiology: ordered.    Joffrey Rajan is a 72 y.o. male with a past medical history significant for hypertension, hyperlipidemia, polysubstance abuse, mild cardiomyopathy per chart who presents with near syncope.  According to patient, he has not had alcohol in a while and drank margaritas, did some snorted cocaine, then took a Viagra.  He said he had not had Viagra in a while either.  He said that he uses cocaine every several days.  He said that he has had lightheaded episodes in the past when drinking alcohol but instead of using liquor he went and had "grocery store [facility]" did not feel bad.  He said that he had no symptoms prior to this episode and he said  that he did not have a fall, he reports his friend guided him to the ground when he was briefly syncopal.  He does not think he lost consciousness fully but is not certain.  He does not member what happened.  He reports no preceding symptoms such as fevers, chills, congestion, cough, pain, constipation, diarrhea, or urinary changes.  Patient currently just feels tired and denies any palpitations, chest pain, shortness of breath, nausea, vomiting, or urinary symptoms.  He denies any other complaints.   On exam, lungs clear.  Chest nontender.  No murmur.  Patient has intact pulses in extremities.  Patient overall well-appearing.  He is bradycardic in the 40s but otherwise blood pressures around 123456 systolic.  He is afebrile.  Normal oxygen saturations on room air.  EKG does not show STEMI but does show a sinus bradycardia with a right bundle branch block.  Clinically I suspect the patient's, history of alcohol and Viagra likely causes near syncopal episode and the cocaine likely did not help either.  He also thinks that is what happened.  We will however get some fluids in him and get some screening labs and chest x-ray to make sure there is no other acute abnormalities.  The patient proved stability and is feeling better, he is likely going to be stable for discharge home  and avoid this combination of substances in the future.  If concerning findings are discovered, would consider alternative disposition.  Labs are reassuring.  Patient did not want to wait for urinalysis he is feeling better.  Heart rate is in the 50s and 60s.  He was able to stand without lightheadedness and is feeling better.  Patient will avoid medications and drugs together and he will rest and stay hydrated.  EtOH was elevated.  Patient feeling better and was discharged in good condition with improved symptoms after reassuring workup with understanding of outpatient follow-up plan.            Final Clinical  Impression(s) / ED Diagnoses Final diagnoses:  Near syncope    Rx / DC Orders ED Discharge Orders     None       Clinical Impression: 1. Near syncope  Disposition: Discharge  Condition: Good  I have discussed the results, Dx and Tx plan with the pt(& family if present). He/she/they expressed understanding and agree(s) with the plan. Discharge instructions discussed at great length. Strict return precautions discussed and pt &/or family have verbalized understanding of the instructions. No further questions at time of discharge.    New Prescriptions   No medications on file    Follow Up: Janith Lima, MD [place] [address] [place] [facility] [address] [id number] [place]        Tea Collums, Gwenyth Allegra, MD 04/28/22 [phone]

## 2022-04-28 NOTE — Discharge Instructions (Signed)
Your history, exam, workup today are consistent with near syncope/partial syncope in the setting of polypharmacy and drug use.  I suspect the combination of alcohol which was elevated tonight, cocaine, and Viagra caused you to have a near syncopal episode.  As you did not have trauma and your workup was otherwise reassuring, we feel you are safe for discharge home.  Your heart rate improved and you appeared better after some fluids.  You did not have lightheadedness or near syncope when you stood up in the emergency department and we feel you are safe for discharge home.  We had a shared decision-making conversation together to hold on urinalysis given your lack of urinary symptoms.  Please avoid taking these medications together in the future and please follow-up with your primary doctor.  Please rest and stay hydrated.  If any symptoms change or worsen, please return to the nearest emergency department.

## 2022-04-29 LAB — TROPONIN I (HIGH SENSITIVITY): Troponin I (High Sensitivity): 5 ng/L (ref <18)

## 2022-05-01 ENCOUNTER — Telehealth: Payer: Self-pay | Admitting: *Deleted

## 2022-05-01 ENCOUNTER — Encounter: Payer: Self-pay | Admitting: *Deleted

## 2022-05-01 NOTE — Transitions of Care (Post Inpatient/ED Visit) (Signed)
05/01/2022  Name: Nathan Stafford MRN: [id number] DOB: 1951-02-09  Today's TOC FU Call Status: Today's TOC FU Call Status:: Successful TOC FU Call Competed TOC FU Call Complete Date: 05/01/22  Transition Care Management Follow-up Telephone Call Date of Discharge: 04/29/22 Dis[facility]) Type of Discharge: Emergency Department Reason for ED Visit: Other: (syncopal episode in setting of polysubstance use) How have you been since you were released from the hospital?: Better ("I am doing fine after the ER visit-- I have not had any alcohol or any cocaine after that day, completely stopped and plan to stay that way.  Before that day, I had not had any liquor for over 10 months, so I know I won't be doing that anymore.") Any questions or concerns?: No  Items Reviewed: Did you receive and understand the discharge instructions provided?: Yes Medications obtained and verified?: Yes (Medications Reviewed) (full medication review completed; patient self-manages medications and denies concerns; extensive education provided around purpose of medications; denies questions/ concerns around medications; no discrepancies identified) Any new allergies since your discharge?: No Dietary orders reviewed?: No Do you have support at home?: Yes People in Home: significant other Name of Support/Comfort Primary Source: girlfriend  Home Care and Equipment/Supplies: [place] Ordered?: NA Any new equipment or medical supplies ordered?: NA  Functional Questionnaire: Do you need assistance with bathing/showering or dressing?: No Do you need assistance with meal preparation?: No Do you need assistance with eating?: No Do you have difficulty maintaining continence: No Do you need assistance with getting out of bed/getting out of a chair/moving?: No Do you have difficulty managing or taking your medications?: No  Folllow up appointments reviewed: PCP Follow-up appointment  confirmed?: Yes Date of PCP follow-up appointment?: 05/08/22 (care coordination outreach in real time with scheduling care guide- successfully scheduled post-ED visit PCP office visit for 05/08/22) Follow-up Provider: PCP- Dr. Ronnald Ramp Specialist [facility] Follow-up appointment confirmed?: No Reason Specialist Follow-Up Not Confirmed:  (not indicated) Do you need transportation to your follow-up appointment?: No Do you understand care options if your condition(s) worsen?: Yes-patient verbalized understanding  SDOH Interventions Today    Flowsheet Row Most Recent Value  SDOH Interventions   Transportation Interventions Intervention Not Indicated  [drives self]  Alcohol Usage Interventions Other (Comment)  [patient reports had a drink of ETOH 04/28/22 which led to ED visit,  prior to that day, reports had not had ETOH for 10-plus months,  declines offer to schedule with LCSW]      TOC Interventions Today    Flowsheet Row Most Recent Value  TOC Interventions   TOC Interventions Discussed/Reviewed TOC Interventions Discussed, Arranged PCP follow up less than 12 days/Care Guide scheduled  [provided my direct contact information should questions/ concerns/ needs arise post-TOC call today]      Interventions Today    Flowsheet Row Most Recent Value  Chronic Disease   Chronic disease during today's visit Hypertension (HTN)  General Interventions   General Interventions Discussed/Reviewed General Interventions Discussed, Doctor Visits  Doctor Visits Discussed/Reviewed Doctor Visits Discussed, PCP  [care coordination outreach in real time with scheduling care guide,  successfully scheduled post-ED visit PCP office visit for 05/08/22 at 4 pm]  PCP/Specialist Visits Compliance with follow-up visit  Education Interventions   Education Provided Provided Education  Provided Verbal Education On Medication, When to see the doctor, Other  [purpose of medications, specifically BP medications,  full/  extensive review of medications]  Mental Health Interventions   Mental Health Discussed/Reviewed Coping  Strategies, Substance Abuse  [declined need for subsequent outreach from LCSW,  stated he would contact me back if he changes his mind]  Nutrition Interventions   Nutrition Discussed/Reviewed Nutrition Discussed  Pharmacy Interventions   Pharmacy Dicussed/Reviewed Pharmacy Topics Discussed  [full/ extensive review completed]      Oneta Rack, RN, BSN, CCRN Alumnus RN CM Care Coordination/ Transition of [place] Management [phone]: direct office

## 2022-05-03 ENCOUNTER — Telehealth: Payer: Self-pay

## 2022-05-03 NOTE — Telephone Encounter (Signed)
     Patient  visit on 2/25  at [facility]    Have you been able to follow up with your primary care physician? Yes   The patient was or was not able to obtain any needed medicine or equipment. Yes   Are there diet recommendations that you are having difficulty following? Na   Patient expresses understanding of discharge instructions and education provided has no other needs at this time.  Yes     [place] [phone], [place],  [address] Phone: [phone] Email: [email or web address]

## 2022-05-08 ENCOUNTER — Encounter: Payer: Self-pay | Admitting: Internal Medicine

## 2022-05-08 ENCOUNTER — Ambulatory Visit (INDEPENDENT_AMBULATORY_CARE_PROVIDER_SITE_OTHER): Payer: Medicare HMO | Admitting: Internal Medicine

## 2022-05-08 VITALS — BP 138/68 | HR 64 | Temp 98.2°F | Resp 16 | Ht 72.0 in | Wt 201.0 lb

## 2022-05-08 DIAGNOSIS — I5042 Chronic combined systolic (congestive) and diastolic (congestive) heart failure: Secondary | ICD-10-CM | POA: Diagnosis not present

## 2022-05-08 DIAGNOSIS — T502X5A Adverse effect of carbonic-anhydrase inhibitors, benzothiadiazides and other diuretics, initial encounter: Secondary | ICD-10-CM | POA: Diagnosis not present

## 2022-05-08 DIAGNOSIS — E876 Hypokalemia: Secondary | ICD-10-CM

## 2022-05-08 DIAGNOSIS — I110 Hypertensive heart disease with heart failure: Secondary | ICD-10-CM

## 2022-05-08 DIAGNOSIS — I10 Essential (primary) hypertension: Secondary | ICD-10-CM | POA: Diagnosis not present

## 2022-05-08 DIAGNOSIS — I429 Cardiomyopathy, unspecified: Secondary | ICD-10-CM

## 2022-05-08 DIAGNOSIS — R7303 Prediabetes: Secondary | ICD-10-CM

## 2022-05-08 MED ORDER — POTASSIUM CHLORIDE CRYS ER 15 MEQ PO TBCR: 15.0000 meq | EXTENDED_RELEASE_TABLET | Freq: Two times a day (BID) | ORAL | 0 refills | Status: DC

## 2022-05-08 MED ORDER — AMLODIPINE BESYLATE 10 MG PO TABS: 10.0000 mg | ORAL_TABLET | Freq: Every day | ORAL | 0 refills | Status: DC

## 2022-05-08 NOTE — Patient Instructions (Signed)
Hypokalemia Hypokalemia means that the amount of potassium in the blood is lower than normal. Potassium is a mineral (electrolyte) that helps regulate the amount of fluid in the body. It also stimulates muscle tightening (contraction) and helps nerves work properly. Normally, most of the body's potassium is inside cells, and only a very small amount is in the blood. Because the amount in the blood is so small, minor changes to potassium levels in the blood can be life-threatening. What are the causes? This condition may be caused by: Antibiotic medicine. Diarrhea or vomiting. Taking too much of a medicine that helps you have a bowel movement (laxative) can cause diarrhea and lead to hypokalemia. Chronic kidney disease (CKD). Medicines that help the body get rid of excess fluid (diuretics). Eating disorders, such as anorexia or bulimia. Low magnesium levels in the body. Sweating a lot. What are the signs or symptoms? Symptoms of this condition include: Weakness. Constipation. Fatigue. Muscle cramps. Mental confusion. Skipped heartbeats or irregular heartbeat (palpitations). Tingling or numbness. How is this diagnosed? This condition is diagnosed with a blood test. How is this treated? This condition may be treated by: Taking potassium supplements. Adjusting the medicines that you take. Eating more foods that contain a lot of potassium. If your potassium level is very low, you may need to get potassium through an IV and be monitored in the hospital. Follow these instructions at home: Eating and drinking  Eat a healthy diet. A healthy diet includes fresh fruits and vegetables, whole grains, healthy fats, and lean proteins. If told, eat more foods that contain a lot of potassium. These include: Nuts, such as peanuts and pistachios. Seeds, such as sunflower seeds and pumpkin seeds. Peas, lentils, and lima beans. Whole grain and bran cereals and breads. Fresh fruits and vegetables,  such as apricots, avocado, bananas, cantaloupe, kiwi, oranges, tomatoes, asparagus, and potatoes. Juices, such as orange, tomato, and prune. Lean meats, including fish. Milk and milk products, such as yogurt. General instructions Take over-the-counter and prescription medicines only as told by your health care provider. This includes vitamins, natural food products, and supplements. Keep all follow-up visits. This is important. Contact a health care provider if: You have weakness that gets worse. You feel your heart pounding or racing. You vomit. You have diarrhea. You have diabetes and you have trouble keeping your blood sugar in your target range. Get help right away if: You have chest pain. You have shortness of breath. You have vomiting or diarrhea that lasts for more than 2 days. You faint. These symptoms may be an emergency. Get help right away. Call 911. Do not wait to see if the symptoms will go away. Do not drive yourself to the hospital. Summary Hypokalemia means that the amount of potassium in the blood is lower than normal. This condition is diagnosed with a blood test. Hypokalemia may be treated by taking potassium supplements, adjusting the medicines that you take, or eating more foods that are high in potassium. If your potassium level is very low, you may need to get potassium through an IV and be monitored in the hospital. This information is not intended to replace advice given to you by your health care provider. Make sure you discuss any questions you have with your health care provider. Document Revised: 11/02/2020 Document Reviewed: 11/02/2020 Else[employer] Patient Education  [employer].

## 2022-05-08 NOTE — Progress Notes (Signed)
Subjective:  Patient ID: Nathan Stafford, male    DOB: 1950-07-20  Age: 72 y.o. MRN: [id number]  CC: Hypertension   HPI Nathan Stafford presents for f/up ----  He was seen in the ED 10 days ago for a syncopal episode.  He has had no more syncope since then and has felt well.  The syncopal episode occurred in the setting of drinking beer, margaritas, and using cocaine.  He has abstained over the last 10 days.  He was hypokalemic.  He is active and denies chest pain, shortness of breath, diaphoresis, or edema.   Outpatient Medications Prior to Visit  Medication Sig Dispense Refill   aspirin 81 MG tablet Take 1 tablet (81 mg total) by mouth daily. 90 tablet 3   brimonidine (ALPHAGAN) 0.2 % ophthalmic solution Place 1 drop into both eyes 2 (two) times daily.      dorzolamide-timolol (COSOPT) 22.3-6.8 MG/ML ophthalmic solution Place 1 drop into both eyes 2 (two) times daily.      latanoprost (XALATAN) 0.005 % ophthalmic solution Place 1 drop into both eyes at bedtime.     Omega-3 Fatty Acids (FISH OIL PO) Take by mouth as needed.     sildenafil (VIAGRA) 50 MG tablet TAKE 1 TABLET BY MOUTH ONCE DAILY AS NEEDED FOR ERECTILE DYSFUNCTION 30 tablet 11   simvastatin (ZOCOR) 20 MG tablet TAKE 1 TABLET BY MOUTH AT BEDTIME 90 tablet 0   amLODipine (NORVASC) 10 MG tablet Take 1 tablet by mouth once daily 90 tablet 0   hydrochlorothiazide (HYDRODIURIL) 25 MG tablet Take 1 tablet by mouth once daily 90 tablet 0   potassium chloride SA (KLOR-CON M15) 15 MEQ tablet Take 1 tablet (15 mEq total) by mouth 2 (two) times daily. 180 tablet 0   metoprolol succinate (TOPROL-XL) 100 MG 24 hr tablet Take 1 tablet (100 mg total) by mouth 2 (two) times daily. Take with or immediately following a meal. 30 tablet 12   No facility-administered medications prior to visit.    ROS Review of Systems  Constitutional: Negative.  Negative for diaphoresis and fatigue.  HENT: Negative.    Eyes: Negative.   Respiratory:  Negative  for cough, chest tightness, shortness of breath and wheezing.   Cardiovascular:  Negative for chest pain, palpitations and leg swelling.  Gastrointestinal:  Negative for abdominal pain, constipation, diarrhea, nausea and vomiting.  Endocrine: Negative.   Genitourinary: Negative.  Negative for difficulty urinating.  Musculoskeletal: Negative.  Negative for arthralgias and myalgias.  Skin: Negative.  Negative for color change.  Neurological: Negative.  Negative for dizziness and weakness.  Hematological:  Negative for adenopathy. Does not bruise/bleed easily.  Psychiatric/Behavioral: Negative.      Objective:  BP 138/68 (BP Location: Left Arm, Patient Position: Sitting, Cuff Size: Normal)   Pulse 64   Temp 98.2 F (36.8 C) (Oral)   Resp 16   Ht 6' (1.829 m)   Wt 201 lb (91.2 kg)   SpO2 98%   BMI 27.26 kg/m   BP Readings from Last 3 Encounters:  05/08/22 138/68  04/28/22 129/82  11/13/21 138/66    Wt Readings from Last 3 Encounters:  05/08/22 201 lb (91.2 kg)  04/28/22 200 lb (90.7 kg)  11/13/21 193 lb (87.5 kg)    Physical Exam Vitals reviewed.  HENT:     Nose: Nose normal.     Mouth/Throat:     Mouth: Mucous membranes are moist.  Eyes:     General: No scleral icterus.  Conjunctiva/sclera: Conjunctivae normal.  Cardiovascular:     Rate and Rhythm: Normal rate and regular rhythm.     Heart sounds: No murmur heard. Pulmonary:     Effort: Pulmonary effort is normal.     Breath sounds: No stridor. No wheezing, rhonchi or rales.  Abdominal:     General: Abdomen is flat.     Palpations: There is no mass.     Tenderness: There is no abdominal tenderness. There is no guarding.     Hernia: No hernia is present.  Musculoskeletal:        General: Normal range of motion.     Cervical back: Neck supple.     Right lower leg: No edema.     Left lower leg: No edema.  Lymphadenopathy:     Cervical: No cervical adenopathy.  Skin:    General: Skin is warm and dry.   Neurological:     General: No focal deficit present.     Mental Status: He is alert.  Psychiatric:        Mood and Affect: Mood normal.        Behavior: Behavior normal.     Lab Results  Component Value Date   WBC 9.0 04/28/2022   HGB 13.7 04/28/2022   HCT 40.6 04/28/2022   PLT 186 04/28/2022   GLUCOSE 84 05/08/2022   CHOL 130 11/13/2021   TRIG 99.0 11/13/2021   HDL 38.90 (L) 11/13/2021   LDLCALC 72 11/13/2021   ALT 12 04/28/2022   AST 17 04/28/2022   NA 143 05/08/2022   K 4.0 05/08/2022   CL 106 05/08/2022   CREATININE 0.92 05/08/2022   BUN 13 05/08/2022   CO2 30 05/08/2022   TSH 3.590 04/28/2022   PSA 1.17 11/13/2021   HGBA1C 6.1 05/08/2022    DG Chest Portable 1 View  Result Date: 04/28/2022 CLINICAL DATA:  Near syncope after combination of alcohol, cocaine, and Viagra. Now feeling fatigued. EXAM: PORTABLE CHEST 1 VIEW COMPARISON:  None Available. FINDINGS: Lung volumes are low. Heart is prominent in size, however likely accentuated by low volumes and AP technique. Bronchovascular crowding versus vascular congestion. No focal airspace disease. Mild biapical pleuroparenchymal scarring. No pleural effusion or pneumothorax. No acute osseous abnormalities IMPRESSION: 1. Low lung volumes with bronchovascular crowding versus vascular congestion. 2. Prominent heart size, likely accentuated by low volumes and AP technique. Electronically Signed   By: Keith Rake M.D.   On: 04/28/2022 21:44    Assessment & Plan:   Nathan Stafford was seen today for hypertension.  Diagnoses and all orders for this visit:  Primary hypertension- His blood pressure is adequately well-controlled but he has recently had hypokalemia.  Will continue the CCB, discontinue the thiazide diuretic, and prescribed a potassium supplement. -     Basic metabolic panel; Future -     amLODipine (NORVASC) 10 MG tablet; Take 1 tablet (10 mg total) by mouth daily. -     potassium chloride SA (KLOR-CON M15) 15 MEQ  tablet; Take 1 tablet (15 mEq total) by mouth 2 (two) times daily. -     Basic metabolic panel  Prediabetes -     Basic metabolic panel; Future -     Hemoglobin A1c; Future -     Hemoglobin A1c -     Basic metabolic panel  Diuretic-induced hypokalemia- His potassium is normal now. -     Magnesium; Future -     Basic metabolic panel; Future -     potassium chloride  SA (KLOR-CON M15) 15 MEQ tablet; Take 1 tablet (15 mEq total) by mouth 2 (two) times daily. -     Basic metabolic panel -     Magnesium  Essential hypertension  Hypertensive heart disease with chronic combined systolic and diastolic congestive heart failure ([place])- He has a normal volume status. -     amLODipine (NORVASC) 10 MG tablet; Take 1 tablet (10 mg total) by mouth daily.  Cardiomyopathy, unspecified type ([place]) -     amLODipine (NORVASC) 10 MG tablet; Take 1 tablet (10 mg total) by mouth daily.   I have discontinued Florencio Torian's hydrochlorothiazide. I have also changed his amLODipine. Additionally, I am having him maintain his dorzolamide-timolol, brimonidine, aspirin, Omega-3 Fatty Acids (FISH OIL PO), sildenafil, metoprolol succinate, simvastatin, latanoprost, and potassium chloride SA.  Meds ordered this encounter  Medications   amLODipine (NORVASC) 10 MG tablet    Sig: Take 1 tablet (10 mg total) by mouth daily.    Dispense:  90 tablet    Refill:  0   potassium chloride SA (KLOR-CON M15) 15 MEQ tablet    Sig: Take 1 tablet (15 mEq total) by mouth 2 (two) times daily.    Dispense:  180 tablet    Refill:  0     Follow-up: Return in about 3 months (around 08/08/2022).  Scarlette Calico, MD

## 2022-05-09 LAB — BASIC METABOLIC PANEL
BUN: 13 mg/dL (ref 6–23)
CO2: 30 mEq/L (ref 19–32)
Calcium: 9.8 mg/dL (ref 8.4–10.5)
Chloride: 106 mEq/L (ref 96–112)
Creatinine, Ser: 0.92 mg/dL (ref 0.40–1.50)
GFR: 82.86 mL/min (ref >60.00)
Glucose, Bld: 84 mg/dL (ref 70–99)
Potassium: 4 mEq/L (ref 3.5–5.1)
Sodium: 143 mEq/L (ref 135–145)

## 2022-05-09 LAB — HEMOGLOBIN A1C: Hgb A1c MFr Bld: 6.1 % (ref 4.6–6.5)

## 2022-05-09 LAB — MAGNESIUM: Magnesium: 2 mg/dL (ref 1.5–2.5)

## 2022-05-11 ENCOUNTER — Encounter: Payer: Self-pay | Admitting: Internal Medicine

## 2022-05-16 ENCOUNTER — Other Ambulatory Visit: Payer: Self-pay | Admitting: Emergency Medicine

## 2022-05-16 DIAGNOSIS — I429 Cardiomyopathy, unspecified: Secondary | ICD-10-CM

## 2022-06-07 ENCOUNTER — Other Ambulatory Visit: Payer: Self-pay | Admitting: Internal Medicine

## 2022-06-07 DIAGNOSIS — E785 Hyperlipidemia, unspecified: Secondary | ICD-10-CM

## 2022-06-25 DIAGNOSIS — H40159 Residual stage of open-angle glaucoma, unspecified eye: Secondary | ICD-10-CM | POA: Diagnosis not present

## 2022-06-25 DIAGNOSIS — Z008 Encounter for other general examination: Secondary | ICD-10-CM | POA: Diagnosis not present

## 2022-06-25 DIAGNOSIS — N529 Male erectile dysfunction, unspecified: Secondary | ICD-10-CM | POA: Diagnosis not present

## 2022-06-25 DIAGNOSIS — I10 Essential (primary) hypertension: Secondary | ICD-10-CM | POA: Diagnosis not present

## 2022-06-25 DIAGNOSIS — F17210 Nicotine dependence, cigarettes, uncomplicated: Secondary | ICD-10-CM | POA: Diagnosis not present

## 2022-06-25 DIAGNOSIS — I25119 Atherosclerotic heart disease of native coronary artery with unspecified angina pectoris: Secondary | ICD-10-CM | POA: Diagnosis not present

## 2022-06-25 DIAGNOSIS — E785 Hyperlipidemia, unspecified: Secondary | ICD-10-CM | POA: Diagnosis not present

## 2022-06-25 DIAGNOSIS — Z888 Allergy status to other drugs, medicaments and biological substances status: Secondary | ICD-10-CM | POA: Diagnosis not present

## 2022-06-27 ENCOUNTER — Other Ambulatory Visit: Payer: Self-pay | Admitting: General Practice

## 2022-07-01 ENCOUNTER — Other Ambulatory Visit: Payer: Self-pay

## 2022-07-01 ENCOUNTER — Telehealth: Payer: Self-pay | Admitting: Cardiology

## 2022-07-01 MED ORDER — METOPROLOL SUCCINATE ER 100 MG PO TB24: 100.0000 mg | ORAL_TABLET | Freq: Every day | ORAL | 0 refills | Status: DC

## 2022-07-01 NOTE — Telephone Encounter (Signed)
*  STAT* If patient is at the pharmacy, call can be transferred to refill team.   1. Which medications need to be refilled? (please list name of each medication and dose if known)  metoprolol succinate (TOPROL-XL) 100 MG 24 hr tablet   2. Which pharmacy/location (including street and city if local pharmacy) is medication to be sent to? [id number] - [place] ([place]), [place] - [address]   3. Do they need a 30 day or 90 day supply? 90 day supply  Patient is completely out of medication

## 2022-07-01 NOTE — Telephone Encounter (Signed)
Returned call to patient-advised no samples of Toprol XL available but will send in refill until scheduled appt on 5/8.     OV note 06/25/2021 with Edd Fabian NP: 1.  Hypertensive heart disease-BP today 126/80.  Well-controlled at home.  Previously did not tolerate 100 mg of metoprolol dosing due to drowsiness.  Metoprolol was divided into 50 mg twice daily.  Is currently taking 100 mg in the morning and tolerating well. Continue amlodipine, HCTZ, metoprolol Heart healthy low-sodium diet-salty 6 given Increase physical activity as tolerated   Med list states Toprol XL 100 mg BID. Patient reports he is taking Toprol XL 100 mg daily as reflected in note.     Rx sent to pharmacy. Advised to keep upcoming appt for future refills. Patient aware.

## 2022-07-01 NOTE — Telephone Encounter (Signed)
Patient calling the office for samples of medication:   1.  What medication and dosage are you requesting samples for? metoprolol succinate (TOPROL-XL) 100 MG 24 hr tablet   2.  Are you currently out of this medication?  Pt is completely out of this medication. He states he took his last dose of it this morning.

## 2022-07-04 ENCOUNTER — Ambulatory Visit: Payer: Medicare HMO | Admitting: Cardiology

## 2022-07-08 NOTE — Progress Notes (Unsigned)
Cardiology Clinic Note   Patient Name: Nathan Stafford Date of Encounter: 07/10/2022  Primary Care Provider:  Etta Grandchild, MD Primary Cardiologist:  Bryan Lemma, MD  Patient Profile    Nathan Stafford 72 year old male presents the clinic today for follow-up evaluation of his cardiomyopathy and hypertension.  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   GERD (gastroesophageal reflux disease)    Glaucoma    Hematuria    HLD (hyperlipidemia)    HTN (hypertension)    Perinephric hematoma    Past Surgical History:  Procedure Laterality Date   colonscopy     EYE SURGERY Right 1993   LASIK      Allergies  Allergies  Allergen Reactions   Ace Inhibitors Swelling    History of Present Illness    Ulis Streets has a PMH of cardiomyopathy, RBBB, combined systolic and diastolic CHF, prediabetes, ED, and diuretic induced hypokalemia. His EF was 40-45% on echocardiogram in 2013.  Follow-up echocardiogram in 2018 showed LVEF in the low normal range.  He was noted to have normal coronaries by cath in 2008.  His PMH also includes tobacco abuse and ACE inhibitor allergy.   He was seen by Dr. Herbie Baltimore on 03/27/2021.  During that time his blood pressure was elevated at 148/76.  He was instructed to increase his metoprolol to succinate to 100 mg daily.  He remained stable from a cardiac standpoint.  Follow-up was planned for 3 months.   Contacted nurse triage line on 04/13/2021.  He reported that when taking 100 mg of metoprolol he would feel drowsy.  He was transition back to  50 mg a.m. and 50 mg p.m.   He presented to the clinic 06/25/2021 for follow-up evaluation states he felt well.  He was  taking 100 mg of his metoprolol in the morning and denied  drowsiness.  He continued to work part-time sweeping.  He reported that he tried to retire and felt like he needed something to do.  He had been at his job for around 27 years.  He continued to follow a low-sodium diet.  We reviewed his prescription and he expressed understanding.  He had been monitoring his blood pressure at the local store.  It had been well controlled in the 120s over 70s-80s.  I  refilled his metoprolol, and planned follow-up for 9 to 12 months.  He was seen in the [facility] emergency department 04/08/2022 with near syncope.  At the time of evaluation he reported that his symptoms had resolved.  His symptoms were felt to be related to dehydration.  He denied chest pain, confusion, dyspnea, fever, headaches.  His EKG showed sinus rhythm RBBB and LAFB and left ventricular hypertrophy.  He reported that he was drinking margaritas snorted some cocaine and then took Viagra.  He then noted lightheadedness.  He denied falls.  His lab work was reassuring.  He did not want to wait for urinalysis.  His heart rate was maintained in the 50s and 60s.  He was able to stand without lightheadedness.  He was discharged in good condition.  He presents to the clinic today for follow-up evaluation and states he feels well.  He continues to sweep part-time.  We reviewed his emergency department visit  and his medications.  Initially his blood pressure in the clinic today is 178/82.  There may be some component of whitecoat hypertension.  On recheck his blood pressure is 136/78.  He reports compliance with his medications.  He reports alcohol and cocaine cessation.  He has been following with his PCP for his diabetes.  I reviewed the importance of low-sodium diet.  I will have him maintain a blood pressure log, increase his physical activity as tolerated and follow-up in 9 to 12 months.   Today he denies chest pain, shortness of breath, lower extremity edema, fatigue, palpitations, melena, hematuria, hemoptysis,  diaphoresis, weakness, presyncope, syncope, orthopnea, and PND.     Home Medications    Prior to Admission medications   Medication Sig Start Date End Date Taking? Authorizing Provider  amLODipine (NORVASC) 10 MG tablet Take 1 tablet (10 mg total) by mouth daily. 05/08/22   Etta Grandchild, MD  aspirin 81 MG tablet Take 1 tablet (81 mg total) by mouth daily. 05/09/17   Ofilia Neas, PA-C  brimonidine (ALPHAGAN) 0.2 % ophthalmic solution Place 1 drop into both eyes 2 (two) times daily.     [provider]  dorzolamide-timolol (COSOPT) 22.3-6.8 MG/ML ophthalmic solution Place 1 drop into both eyes 2 (two) times daily.     [provider]  latanoprost (XALATAN) 0.005 % ophthalmic solution Place 1 drop into both eyes at bedtime. 05/05/22   [provider]  metoprolol succinate (TOPROL-XL) 100 MG 24 hr tablet Take 1 tablet (100 mg total) by mouth daily. Take with or immediately following a meal.  Please keep upcoming appointment for future refills. 07/01/22 09/29/22  Marykay Lex, MD  Omega-3 Fatty Acids (FISH OIL PO) Take by mouth as needed.    [provider]  potassium chloride SA (KLOR-CON M15) 15 MEQ tablet Take 1 tablet (15 mEq total) by mouth 2 (two) times daily. 05/08/22   Etta Grandchild, MD  sildenafil (VIAGRA) 50 MG tablet TAKE 1 TABLET BY MOUTH ONCE DAILY AS NEEDED FOR ERECTILE DYSFUNCTION 05/16/22   Georgina Quint, MD  simvastatin (ZOCOR) 20 MG tablet TAKE 1 TABLET BY MOUTH AT BEDTIME 06/07/22   Etta Grandchild, MD    Family History    Family History  Problem Relation Age of Onset   Diabetes Father    He indicated that his mother is deceased. He indicated that his father is alive. He indicated that five of his seven sisters are alive. He indicated that three of his five brothers are alive. He indicated that his maternal grandmother is deceased. He indicated that his maternal grandfather is deceased. He indicated that his paternal grandmother is  deceased. He indicated that his paternal grandfather is deceased. He indicated that his son is alive.  Social History    Social History   Socioeconomic History   Marital status: Married    Spouse name: Not on file   Number of children: 1   Years of education: Not on file   Highest education level: Not on file  Occupational History   Occupation: Receiving Marine scientist: [employer]  Tobacco Use   Smoking status: Every Day    Packs/day: 0.50    Years: 40.00    Additional pack years: 0.00    Total pack years: 20.00    Types: Cigarettes    Passive exposure: Current   Smokeless tobacco: Never  Substance and Sexual Activity   Alcohol use: Yes    Alcohol/week:  10.0 standard drinks of alcohol    Types: 10 Cans of beer per week   Drug use: No   Sexual activity: Yes    Partners: Female  Other Topics Concern   Not on file  Social History Narrative   Lives with his wife.   Social Determinants of Health   Financial Resource Strain: Low Risk  (09/24/2021)   Overall Financial Resource Strain (CARDIA)    Difficulty of Paying Living Expenses: Not hard at all  Food Insecurity: No Food Insecurity (09/24/2021)   Hunger Vital Sign    Worried About Running Out of Food in the Last Year: Never true    Ran Out of Food in the Last Year: Never true  Transportation Needs: No Transportation Needs (05/01/2022)   PRAPARE - Administrator, Civil Service (Medical): No    Lack of Transportation (Non-Medical): No  Physical Activity: Insufficiently Active (09/24/2021)   Exercise Vital Sign    Days of Exercise per Week: 3 days    Minutes of Exercise per Session: 30 min  Stress: No Stress Concern Present (09/24/2021)   [employer] of Occupational Health - Occupational Stress Questionnaire    Feeling of Stress : Not at all  Social Connections: Socially Isolated (09/24/2021)   Social Connection and Isolation Panel [NHANES]    Frequency of Communication with Friends and Family: Three times a  week    Frequency of Social Gatherings with Friends and Family: Three times a week    Attends Religious Services: Never    Active Member of Clubs or Organizations: No    Attends Banker Meetings: Never    Marital Status: Separated  Intimate Partner Violence: Not At Risk (09/24/2021)   Humiliation, Afraid, Rape, and Kick questionnaire    Fear of Current or Ex-Partner: No    Emotionally Abused: No    Physically Abused: No    Sexually Abused: No     Review of Systems    General:  No chills, fever, night sweats or weight changes.  Cardiovascular:  No chest pain, dyspnea on exertion, edema, orthopnea, palpitations, paroxysmal nocturnal dyspnea. Dermatological: No rash, lesions/masses Respiratory: No cough, dyspnea Urologic: No hematuria, dysuria Abdominal:   No nausea, vomiting, diarrhea, bright red blood per rectum, melena, or hematemesis Neurologic:  No visual changes, wkns, changes in mental status. All other systems reviewed and are otherwise negative except as noted above.  Physical Exam    VS:  BP 136/78   Pulse 76   Ht 6\' 2"  (1.88 m)   Wt 204 lb (92.5 kg)   SpO2 99%   BMI 26.19 kg/m  , BMI Body mass index is 26.19 kg/m. GEN: Well nourished, well developed, in no acute distress. HEENT: normal. Neck: Supple, no JVD, carotid bruits, or masses. Cardiac: RRR, no murmurs, rubs, or gallops. No clubbing, cyanosis, edema.  Radials/DP/PT 2+ and equal bilaterally.  Respiratory:  Respirations regular and unlabored, clear to auscultation bilaterally. GI: Soft, nontender, nondistended, BS + x 4. MS: no deformity or atrophy. Skin: warm and dry, no rash. Neuro:  Strength and sensation are intact. Psych: Normal affect.  Accessory Clinical Findings    Recent Labs: 04/28/2022: ALT 12; Hemoglobin 13.7; Platelets 186; TSH 3.590 05/08/2022: BUN 13; Creatinine, Ser 0.92; Magnesium 2.0; Potassium 4.0; Sodium 143   Recent Lipid Panel    Component Value Date/Time   CHOL 130  11/13/2021 1553   CHOL 128 05/11/2020 0944   TRIG 99.0 11/13/2021 1553   HDL 38.90 (L)  11/13/2021 1553   HDL 33 (L) 05/11/2020 0944   CHOLHDL 3 11/13/2021 1553   VLDL 19.8 11/13/2021 1553   LDLCALC 72 11/13/2021 1553   LDLCALC 79 05/11/2020 0944         ECG personally reviewed by me today-none today  Echocardiogram 07/26/2016   Study Conclusions   - Left ventricle: The cavity size was normal. Wall thickness was    increased in a pattern of moderate LVH. The estimated ejection    fraction was 50%. Diffuse hypokinesis, somewhat worse in the    inferolateral wall. Doppler parameters are consistent with    abnormal left ventricular relaxation (grade 1 diastolic    dysfunction).  - Aortic valve: There was no stenosis.  - Aorta: Mildly dilated aortic root. Aortic root dimension: 39 mm    (ED).  - Mitral valve: There was trivial regurgitation.  - Left atrium: The atrium was mildly dilated.  - Right ventricle: The cavity size was normal. Systolic function    was normal.  - Tricuspid valve: Peak RV-RA gradient (S): 26 mm Hg.  - Pulmonary arteries: PA peak pressure: 29 mm Hg (S).  - Inferior vena cava: The vessel was normal in size. The    respirophasic diameter changes were in the normal range (= 50%),    consistent with normal central venous pressure.   Impressions:   - Normal LV size with moderate LV hypertrophy. EF 50%, diffuse    hypokinesis that appears somewhat worse in the inferolateral    wall. Normal RV size and systolic function. No significant    valvular abnormalities.   Assessment & Plan   1.  Hypertensive heart disease-BP today 136/78.    Previously unable to tolerate 100 mg of metoprolol dosing due to drowsiness.  Metoprolol was divided into 50 mg twice daily.  He continues to take 100 mg in the morning and tolerate well. Continue amlodipine, metoprolol Heart healthy low-sodium diet Increase physical activity as tolerated   Cardiomyopathy-echocardiogram  07/26/2016 showed LVEF of 50%.  Also noted was diffuse hypokinesis.  Allergy to ACE inhibitors. Continue metoprolol, hydrochlorothiazide Heart healthy low-sodium diet Increase physical activity as tolerated Encouraged avoiding excess EtOH and avoiding cocaine use.  RBBB-heart rate today 76 bpm.  Denies further episodes of presyncope, syncope and lightheadedness.  Euvolemic.  Noted episode of presyncope in the setting of alcohol, cocaine and Viagra use.  Was seen and evaluated in the emergency department in February. Continue metoprolol Continue to monitor   Hyperlipidemia-LDL 72 on 9/23 Continue aspirin, simvastatin, omega-3 fatty acids Heart healthy low-sodium high-fiber diet Increase physical activity as tolerated Repeat fasting lipids and LFTs 9/24  Disposition: Follow-up with Dr. Herbie Baltimore or me in 9-12 months.   Thomasene Ripple. Toure Edmonds NP-C     07/10/2022, 8:32 AM [facility] [address] Office [phone] Fax [phone]    I spent 14 minutes examining this patient, reviewing medications, and using patient centered shared decision making involving her cardiac care.  Prior to her visit I spent greater than 20 minutes reviewing her past medical history,  medications, and prior cardiac tests.

## 2022-07-10 ENCOUNTER — Ambulatory Visit: Payer: Medicare HMO | Attending: Cardiology | Admitting: General Practice

## 2022-07-10 ENCOUNTER — Encounter: Payer: Self-pay | Admitting: General Practice

## 2022-07-10 VITALS — BP 136/78 | HR 76 | Ht 74.0 in | Wt 204.0 lb

## 2022-07-10 DIAGNOSIS — I5042 Chronic combined systolic (congestive) and diastolic (congestive) heart failure: Secondary | ICD-10-CM | POA: Diagnosis not present

## 2022-07-10 DIAGNOSIS — I110 Hypertensive heart disease with heart failure: Secondary | ICD-10-CM

## 2022-07-10 DIAGNOSIS — I429 Cardiomyopathy, unspecified: Secondary | ICD-10-CM | POA: Diagnosis not present

## 2022-07-10 DIAGNOSIS — E7849 Other hyperlipidemia: Secondary | ICD-10-CM

## 2022-07-10 DIAGNOSIS — I4510 Unspecified right bundle-branch block: Secondary | ICD-10-CM

## 2022-07-10 NOTE — Patient Instructions (Signed)
Medication Instructions:  The current medical regimen is effective;  continue present plan and medications as directed. Please refer to the Current Medication list given to you today.  *If you need a refill on your cardiac medications before your next appointment, please call your pharmacy*  Lab Work: NONE If you have labs (blood work) drawn today and your tests are completely normal, you will receive your results only by: MyChart Message (if you have MyChart) OR A paper copy in the mail If you have any lab test that is abnormal or we need to change your treatment, we will call you to review the results.  Testing/Procedures: N  Follow-Up: At [facility], you and your health needs are our priority.  As part of our continuing mission to provide you with exceptional heart care, we have created designated Provider Care Teams.  These Care Teams include your primary Cardiologist (physician) and Advanced Practice Providers (APPs -  Physician Assistants and Nurse Practitioners) who all work together to provide you with the care you need, when you need it.  We recommend signing up for the patient portal called "MyChart".  Sign up information is provided on this After Visit Summary.  MyChart is used to connect with patients for Virtual Visits (Telemedicine).  Patients are able to view lab/test results, encounter notes, upcoming appointments, etc.  Non-urgent messages can be sent to your provider as well.   To learn more about what you can do with MyChart, go to [email or web address].    Your next appointment:   9 month(s)  Provider:   Bryan Lemma, MD  or Edd Fabian, FNP        Other Instructions CONTINUE PHYSICAL ACTIVITY   TAKE AND LOG YOUR BLOOD PRESSURE  PLEASE READ AND FOLLOW ATTACHED  SALTY 6

## 2022-07-17 ENCOUNTER — Telehealth: Payer: Self-pay

## 2022-07-17 NOTE — Telephone Encounter (Signed)
Contacted Corbin Krampitz to schedule their annual wellness visit. Appointment made for 07/25/22.

## 2022-07-25 ENCOUNTER — Ambulatory Visit (INDEPENDENT_AMBULATORY_CARE_PROVIDER_SITE_OTHER): Payer: Medicare HMO

## 2022-07-25 VITALS — Ht 74.0 in | Wt 204.0 lb

## 2022-07-25 DIAGNOSIS — Z122 Encounter for screening for malignant neoplasm of respiratory organs: Secondary | ICD-10-CM

## 2022-07-25 DIAGNOSIS — Z0000 Encounter for general adult medical examination without abnormal findings: Secondary | ICD-10-CM

## 2022-07-25 NOTE — Progress Notes (Signed)
I connected with  Baldwin Crown on 07/25/22 by a audio enabled telemedicine application and verified that I am speaking with the correct person using two identifiers.  Patient Location: Home  Provider Location: Office/Clinic  I discussed the limitations of evaluation and management by telemedicine. The patient expressed understanding and agreed to proceed.  Subjective:   Nathan Stafford is a 72 y.o. male who presents for Medicare Annual/Subsequent preventive examination.  Review of Systems     Cardiac Risk Factors include: advanced age (>62men, >2 women);dyslipidemia;family history of premature cardiovascular disease;hypertension;male gender;smoking/ tobacco exposure     Objective:    Today's Vitals   07/25/22 1501  Weight: 204 lb (92.5 kg)  Height: 6\' 2"  (1.88 m)  PainSc: 0-No pain   Body mass index is 26.19 kg/m.     07/25/2022    3:03 PM 04/28/2022    9:01 PM 09/24/2021    3:40 PM 12/13/2019   12:19 PM 09/04/2019    7:25 AM 04/12/2019    6:12 PM 08/03/2018    9:02 AM  Advanced Directives  Does Patient Have a Medical Advance Directive? Yes No Yes No No No No  Type of Estate agent of [place];Living will  Healthcare Power of Heath;Living will      Copy of Healthcare Power of Attorney in Chart? No - copy requested  No - copy requested      Would patient like information on creating a medical advance directive?    Yes (ED - Information included in AVS)  No - Patient declined No - Patient declined    Current Medications (verified) Outpatient Encounter Medications as of 07/25/2022  Medication Sig   amLODipine (NORVASC) 10 MG tablet Take 1 tablet (10 mg total) by mouth daily.   aspirin 81 MG tablet Take 1 tablet (81 mg total) by mouth daily.   brimonidine (ALPHAGAN) 0.2 % ophthalmic solution Place 1 drop into both eyes 2 (two) times daily.    dorzolamide-timolol (COSOPT) 22.3-6.8 MG/ML ophthalmic solution Place 1 drop into both eyes 2 (two) times daily.     latanoprost (XALATAN) 0.005 % ophthalmic solution Place 1 drop into both eyes at bedtime.   metoprolol succinate (TOPROL-XL) 100 MG 24 hr tablet Take 1 tablet (100 mg total) by mouth daily. Take with or immediately following a meal.  Please keep upcoming appointment for future refills.   Omega-3 Fatty Acids (FISH OIL PO) Take by mouth as needed.   potassium chloride SA (KLOR-CON M15) 15 MEQ tablet Take 1 tablet (15 mEq total) by mouth 2 (two) times daily.   sildenafil (VIAGRA) 50 MG tablet TAKE 1 TABLET BY MOUTH ONCE DAILY AS NEEDED FOR ERECTILE DYSFUNCTION   simvastatin (ZOCOR) 20 MG tablet TAKE 1 TABLET BY MOUTH AT BEDTIME   No facility-administered encounter medications on file as of 07/25/2022.    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   GERD (gastroesophageal reflux disease)    Glaucoma    Hematuria    HLD (hyperlipidemia)    HTN (hypertension)    Perinephric hematoma    Past Surgical History:  Procedure Laterality Date   colonscopy     EYE SURGERY Right 1993   LASIK     Family History  Problem Relation Age of  Onset   Diabetes Father    Social History   Socioeconomic History   Marital status: Married    Spouse name: Not on file   Number of children: 1   Years of education: Not on file   Highest education level: Not on file  Occupational History   Occupation: Receiving Clerk    Employer: [employer]  Tobacco Use   Smoking status: Every Day    Packs/day: 0.50    Years: 40.00    Additional pack years: 0.00    Total pack years: 20.00    Types: Cigarettes    Passive exposure: Current   Smokeless tobacco: Never  Substance and Sexual Activity   Alcohol use: Yes    Alcohol/week: 10.0 standard drinks of  alcohol    Types: 10 Cans of beer per week   Drug use: No   Sexual activity: Yes    Partners: Female  Other Topics Concern   Not on file  Social History Narrative   Lives with his wife.   Social Determinants of Health   Financial Resource Strain: Low Risk  (07/25/2022)   Overall Financial Resource Strain (CARDIA)    Difficulty of Paying Living Expenses: Not hard at all  Food Insecurity: No Food Insecurity (07/25/2022)   Hunger Vital Sign    Worried About Running Out of Food in the Last Year: Never true    Ran Out of Food in the Last Year: Never true  Transportation Needs: No Transportation Needs (07/25/2022)   PRAPARE - Administrator, Civil Service (Medical): No    Lack of Transportation (Non-Medical): No  Physical Activity: Insufficiently Active (07/25/2022)   Exercise Vital Sign    Days of Exercise per Week: 3 days    Minutes of Exercise per Session: 30 min  Stress: No Stress Concern Present (07/25/2022)   [employer] of Occupational Health - Occupational Stress Questionnaire    Feeling of Stress : Not at all  Social Connections: Socially Isolated (07/25/2022)   Social Connection and Isolation Panel [NHANES]    Frequency of Communication with Friends and Family: Three times a week    Frequency of Social Gatherings with Friends and Family: Three times a week    Attends Religious Services: Never    Active Member of Clubs or Organizations: No    Attends Banker Meetings: Never    Marital Status: Widowed    Tobacco Counseling Ready to quit: Not Answered Counseling given: Not Answered   Clinical Intake:  Pre-visit preparation completed: Yes  Pain : No/denies pain Pain Score: 0-No pain     BMI - recorded: 26.19 Nutritional Status: BMI 25 -29 Overweight Nutritional Risks: None Diabetes: No  How often do you need to have someone help you when you read instructions, pamphlets, or other written materials from your doctor or pharmacy?: 1 -  Never What is the last grade level you completed in school?: 11th grade  Diabetic? No  Interpreter Needed?: No  Information entered by :: Luciel Brickman N. Atalya Dano, LPN.   Activities of Daily Living    07/25/2022    3:07 PM 09/24/2021    3:45 PM  In your present state of health, do you have any difficulty performing the following activities:  Hearing? 0 0  Vision? 0 0  Difficulty concentrating or making decisions? 0 0  Walking or climbing stairs? 0 0  Dressing or bathing? 0 0  Doing errands, shopping? 0 0  Preparing Food and eating ? N N  Using the Toilet? N N  In the past six months, have you accidently leaked urine? N N  Do you have problems with loss of bowel control? N N  Managing your Medications? N N  Managing your Finances? N N  Housekeeping or managing your Housekeeping? N N    Patient Care Team: Etta Grandchild, MD as PCP - General (Internal Medicine) Marykay Lex, MD as PCP - Cardiology (Cardiology)  Indicate any recent Medical Services you may have received from other than [facility] providers in the past year (date may be approximate).     Assessment:   This is a routine wellness examination for Trelon.  Hearing/Vision screen Hearing Screening - Comments:: Patient denied any hearing difficulty.   No hearing aids.  Vision Screening - Comments:: Vision: Patient does not wear any corrective lenses/contacts.   Eye exam done by: Bernerd Pho, OD.   Dietary issues and exercise activities discussed: Current Exercise Habits: The patient has a physically strenuous job, but has no regular exercise apart from work., Exercise limited by: cardiac condition(s);respiratory conditions(s)   Goals Addressed             This Visit's Progress    What is a SMART goal for qutting smoking?       Here's an example of a SMART goal: " Starting Monday, I will cut back by at least one cigarette a day until my quit date." What SMART goals can you set for yourself? Think about what you  can achieve over the next week to help you quit smoking.      Depression Screen    07/25/2022    3:07 PM 05/08/2022    3:54 PM 09/24/2021    3:44 PM 09/24/2021    3:39 PM 05/11/2020    9:38 AM 12/13/2019   12:14 PM 08/03/2018    9:04 AM  PHQ 2/9 Scores  PHQ - 2 Score 0 0 0 0 0 0 0  PHQ- 9 Score 0          Fall Risk    07/25/2022    3:06 PM 05/08/2022    3:53 PM 09/24/2021    3:41 PM 05/11/2020    9:38 AM 03/08/2020    2:50 PM  Fall Risk   Falls in the past year? 1 1 1 1 1   Number falls in past yr: 1 0 0 0 1  Injury with Fall? 1 0 0 0 0  Risk for fall due to : History of fall(s);Impaired balance/gait;Orthopedic patient Other (Comment)     Follow up Education provided;Falls prevention discussed Falls evaluation completed Falls evaluation completed;Education provided Falls evaluation completed Falls evaluation completed  Comment   cane      FALL RISK PREVENTION PERTAINING TO THE HOME:  Any stairs in or around the home? No  If so, are there any without handrails? No  Home free of loose throw rugs in walkways, pet beds, electrical cords, etc? Yes  Adequate lighting in your home to reduce risk of falls? Yes   ASSISTIVE DEVICES UTILIZED TO PREVENT FALLS:  Life alert? Yes  Alexa Use of a cane, walker or w/c? No  Grab bars in the bathroom? Yes  Shower chair or bench in shower? Yes  Elevated toilet seat or a handicapped toilet? Yes   TIMED UP AND GO:  Was the test performed? No . Telephonic Visit  Cognitive Function:        07/25/2022    3:13 PM 12/13/2019  12:11 PM 08/03/2018    9:06 AM  6CIT Screen  What Year? 0 points 0 points 0 points  What month? 0 points 0 points 0 points  What time? 0 points 0 points 0 points  Count back from 20 0 points 0 points 0 points  Months in reverse 0 points 0 points 0 points  Repeat phrase 0 points 0 points 0 points  Total Score 0 points 0 points 0 points    Immunizations Immunization History  Administered Date(s) Administered    Influenza-Unspecified 12/07/2019   PFIZER(Purple Top)SARS-COV-2 Vaccination 05/28/2019, 06/18/2019   Pneumococcal Conjugate-13 04/26/2015    TDAP status: Patient declined.  Flu Vaccine status: Declined, Education has been provided regarding the importance of this vaccine but patient still declined. Advised may receive this vaccine at local pharmacy or Health Dept. Aware to provide a copy of the vaccination record if obtained from local pharmacy or Health Dept. Verbalized acceptance and understanding.  Pneumococcal vaccine status: Declined,  Education has been provided regarding the importance of this vaccine but patient still declined. Advised may receive this vaccine at local pharmacy or Health Dept. Aware to provide a copy of the vaccination record if obtained from local pharmacy or Health Dept. Verbalized acceptance and understanding.   Covid-19 vaccine status: Declined, Education has been provided regarding the importance of this vaccine but patient still declined. Advised may receive this vaccine at local pharmacy or Health Dept.or vaccine clinic. Aware to provide a copy of the vaccination record if obtained from local pharmacy or Health Dept. Verbalized acceptance and understanding.  Qualifies for Shingles Vaccine? Yes   Zostavax completed No   Shingrix Completed?: No.    Education has been provided regarding the importance of this vaccine. Patient has been advised to call insurance company to determine out of pocket expense if they have not yet received this vaccine. Advised may also receive vaccine at local pharmacy or Health Dept. Verbalized acceptance and understanding.  Screening Tests Health Maintenance  Topic Date Due   DTaP/Tdap/Td (1 - Tdap) Never done   Lung Cancer Screening  Never done   Zoster Vaccines- Shingrix (1 of 2) Never done   Pneumonia Vaccine 52+ Years old (2 of 2 - PPSV23 or PCV20) 06/21/2015   INFLUENZA VACCINE  10/03/2022   Medicare Annual Wellness (AWV)   07/25/2023   Colonoscopy  09/21/2026   Hepatitis C Screening  Completed   HPV VACCINES  Aged Out   COVID-19 Vaccine  Discontinued    Health Maintenance  Health Maintenance Due  Topic Date Due   DTaP/Tdap/Td (1 - Tdap) Never done   Lung Cancer Screening  Never done   Zoster Vaccines- Shingrix (1 of 2) Never done   Pneumonia Vaccine 79+ Years old (2 of 2 - PPSV23 or PCV20) 06/21/2015    Colorectal cancer screening: Type of screening: Colonoscopy. Completed 09/20/2016. Repeat every 10 years  Lung Cancer Screening: (Low Dose CT Chest recommended if Age 72-80 years, 30 pack-year currently smoking OR have quit w/in 15years.) does qualify.   Lung Cancer Screening Referral: ordered 07/25/2022  Additional Screening:  Hepatitis C Screening: does qualify; Completed 05/09/2017  Vision Screening: Recommended annual ophthalmology exams for early detection of glaucoma and other disorders of the eye. Is the patient up to date with their annual eye exam?  Yes  Who is the provider or what is the name of the office in which the patient attends annual eye exams? Theodoro Clock Scott, OD. If pt is not established with a  provider, would they like to be referred to a provider to establish care? No .   Dental Screening: Recommended annual dental exams for proper oral hygiene  Community Resource Referral / Chronic Care Management: CRR required this visit?  No   CCM required this visit?  No      Plan:     I have personally reviewed and noted the following in the patient's chart:   Medical and social history Use of alcohol, tobacco or illicit drugs  Current medications and supplements including opioid prescriptions. Patient is not currently taking opioid prescriptions. Functional ability and status Nutritional status Physical activity Advanced directives List of other physicians Hospitalizations, surgeries, and ER visits in previous 12 months Vitals Screenings to include cognitive, depression, and  falls Referrals and appointments  In addition, I have reviewed and discussed with patient certain preventive protocols, quality metrics, and best practice recommendations. A written personalized care plan for preventive services as well as general preventive health recommendations were provided to patient.     Mickeal Needy, LPN   4/40/1027   Nurse Notes: Normal cognitive status assessed by direct observation via telephone conversation by this Nurse Health Advisor. No abnormalities found.

## 2022-07-25 NOTE — Patient Instructions (Addendum)
Nathan Stafford , Thank you for taking time to come for your Medicare Wellness Visit. I appreciate your ongoing commitment to your health goals. Please review the following plan we discussed and let me know if I can assist you in the future.   These are the goals we discussed:  Goals      What is a SMART goal for qutting smoking?     Here's an example of a SMART goal: " Starting Monday, I will cut back by at least one cigarette a day until my quit date." What SMART goals can you set for yourself? Think about what you can achieve over the next week to help you quit smoking.        This is a list of the screening recommended for you and due dates:  Health Maintenance  Topic Date Due   DTaP/Tdap/Td vaccine (1 - Tdap) Never done   Screening for Lung Cancer  Never done   Zoster (Shingles) Vaccine (1 of 2) Never done   Pneumonia Vaccine (2 of 2 - PPSV23 or PCV20) 06/21/2015   Flu Shot  10/03/2022   Medicare Annual Wellness Visit  07/25/2023   Colon Cancer Screening  09/21/2026   Hepatitis C Screening  Completed   HPV Vaccine  Aged Out   COVID-19 Vaccine  Discontinued    Advanced directives: Yes  Conditions/risks identified: Yes  Next appointment: Follow up in one year for your annual wellness visit.   Preventive Care 73 Years and Older, Male  Preventive care refers to lifestyle choices and visits with your health care provider that can promote health and wellness. What does preventive care include? A yearly physical exam. This is also called an annual well check. Dental exams once or twice a year. Routine eye exams. Ask your health care provider how often you should have your eyes checked. Personal lifestyle choices, including: Daily care of your teeth and gums. Regular physical activity. Eating a healthy diet. Avoiding tobacco and drug use. Limiting alcohol use. Practicing safe sex. Taking low doses of aspirin every day. Taking vitamin and mineral supplements as recommended by  your health care provider. What happens during an annual well check? The services and screenings done by your health care provider during your annual well check will depend on your age, overall health, lifestyle risk factors, and family history of disease. Counseling  Your health care provider may ask you questions about your: Alcohol use. Tobacco use. Drug use. Emotional well-being. Home and relationship well-being. Sexual activity. Eating habits. History of falls. Memory and ability to understand (cognition). Work and work Astronomer. Screening  You may have the following tests or measurements: Height, weight, and BMI. Blood pressure. Lipid and cholesterol levels. These may be checked every 5 years, or more frequently if you are over 73 years old. Skin check. Lung cancer screening. You may have this screening every year starting at age 73 if you have a 30-pack-year history of smoking and currently smoke or have quit within the past 15 years. Fecal occult blood test (FOBT) of the stool. You may have this test every year starting at age 73. Flexible sigmoidoscopy or colonoscopy. You may have a sigmoidoscopy every 5 years or a colonoscopy every 10 years starting at age 73. Prostate cancer screening. Recommendations will vary depending on your family history and other risks. Hepatitis C blood test. Hepatitis B blood test. Sexually transmitted disease (STD) testing. Diabetes screening. This is done by checking your blood sugar (glucose) after you have not  eaten for a while (fasting). You may have this done every 1-3 years. Abdominal aortic aneurysm (AAA) screening. You may need this if you are a current or former smoker. Osteoporosis. You may be screened starting at age 73 if you are at high risk. Talk with your health care provider about your test results, treatment options, and if necessary, the need for more tests. Vaccines  Your health care provider may recommend certain vaccines,  such as: Influenza vaccine. This is recommended every year. Tetanus, diphtheria, and acellular pertussis (Tdap, Td) vaccine. You may need a Td booster every 10 years. Zoster vaccine. You may need this after age 73. Pneumococcal 13-valent conjugate (PCV13) vaccine. One dose is recommended after age 73. Pneumococcal polysaccharide (PPSV23) vaccine. One dose is recommended after age 73. Talk to your health care provider about which screenings and vaccines you need and how often you need them. This information is not intended to replace advice given to you by your health care provider. Make sure you discuss any questions you have with your health care provider. Document Released: 03/17/2015 Document Revised: 11/08/2015 Document Reviewed: 12/20/2014 Elsevier Interactive Patient Education  2017 [employer].  Fall Prevention in the Home Falls can cause injuries. They can happen to people of all ages. There are many things you can do to make your home safe and to help prevent falls. What can I do on the outside of my home? Regularly fix the edges of walkways and driveways and fix any cracks. Remove anything that might make you trip as you walk through a door, such as a raised step or threshold. Trim any bushes or trees on the path to your home. Use bright outdoor lighting. Clear any walking paths of anything that might make someone trip, such as rocks or tools. Regularly check to see if handrails are loose or broken. Make sure that both sides of any steps have handrails. Any raised decks and porches should have guardrails on the edges. Have any leaves, snow, or ice cleared regularly. Use sand or salt on walking paths during winter. Clean up any spills in your garage right away. This includes oil or grease spills. What can I do in the bathroom? Use night lights. Install grab bars by the toilet and in the tub and shower. Do not use towel bars as grab bars. Use non-skid mats or decals in the tub or  shower. If you need to sit down in the shower, use a plastic, non-slip stool. Keep the floor dry. Clean up any water that spills on the floor as soon as it happens. Remove soap buildup in the tub or shower regularly. Attach bath mats securely with double-sided non-slip rug tape. Do not have throw rugs and other things on the floor that can make you trip. What can I do in the bedroom? Use night lights. Make sure that you have a light by your bed that is easy to reach. Do not use any sheets or blankets that are too big for your bed. They should not hang down onto the floor. Have a firm chair that has side arms. You can use this for support while you get dressed. Do not have throw rugs and other things on the floor that can make you trip. What can I do in the kitchen? Clean up any spills right away. Avoid walking on wet floors. Keep items that you use a lot in easy-to-reach places. If you need to reach something above you, use a strong step stool that  has a grab bar. Keep electrical cords out of the way. Do not use floor polish or wax that makes floors slippery. If you must use wax, use non-skid floor wax. Do not have throw rugs and other things on the floor that can make you trip. What can I do with my stairs? Do not leave any items on the stairs. Make sure that there are handrails on both sides of the stairs and use them. Fix handrails that are broken or loose. Make sure that handrails are as long as the stairways. Check any carpeting to make sure that it is firmly attached to the stairs. Fix any carpet that is loose or worn. Avoid having throw rugs at the top or bottom of the stairs. If you do have throw rugs, attach them to the floor with carpet tape. Make sure that you have a light switch at the top of the stairs and the bottom of the stairs. If you do not have them, ask someone to add them for you. What else can I do to help prevent falls? Wear shoes that: Do not have high heels. Have  rubber bottoms. Are comfortable and fit you well. Are closed at the toe. Do not wear sandals. If you use a stepladder: Make sure that it is fully opened. Do not climb a closed stepladder. Make sure that both sides of the stepladder are locked into place. Ask someone to hold it for you, if possible. Clearly mark and make sure that you can see: Any grab bars or handrails. First and last steps. Where the edge of each step is. Use tools that help you move around (mobility aids) if they are needed. These include: Canes. Walkers. Scooters. Crutches. Turn on the lights when you go into a dark area. Replace any light bulbs as soon as they burn out. Set up your furniture so you have a clear path. Avoid moving your furniture around. If any of your floors are uneven, fix them. If there are any pets around you, be aware of where they are. Review your medicines with your doctor. Some medicines can make you feel dizzy. This can increase your chance of falling. Ask your doctor what other things that you can do to help prevent falls. This information is not intended to replace advice given to you by your health care provider. Make sure you discuss any questions you have with your health care provider. Document Released: 12/15/2008 Document Revised: 07/27/2015 Document Reviewed: 03/25/2014 Elsevier Interactive Patient Education  2017 [employer].

## 2022-08-15 ENCOUNTER — Encounter: Payer: Self-pay | Admitting: Internal Medicine

## 2022-08-15 ENCOUNTER — Ambulatory Visit (INDEPENDENT_AMBULATORY_CARE_PROVIDER_SITE_OTHER): Payer: Medicare HMO | Admitting: Internal Medicine

## 2022-08-15 DIAGNOSIS — I5042 Chronic combined systolic (congestive) and diastolic (congestive) heart failure: Secondary | ICD-10-CM | POA: Diagnosis not present

## 2022-08-15 DIAGNOSIS — I110 Hypertensive heart disease with heart failure: Secondary | ICD-10-CM | POA: Diagnosis not present

## 2022-08-15 DIAGNOSIS — I10 Essential (primary) hypertension: Secondary | ICD-10-CM

## 2022-08-15 DIAGNOSIS — I429 Cardiomyopathy, unspecified: Secondary | ICD-10-CM | POA: Diagnosis not present

## 2022-08-15 NOTE — Progress Notes (Signed)
Subjective:  Patient ID: Nathan Stafford, male    DOB: 1950-05-28  Age: 72 y.o. MRN: [id number]  CC: Hypertension   HPI Nathan Stafford presents for f/up ---  He is active and denies chest pain, shortness of breath, diaphoresis, dizziness, lightheadedness, or edema.  Outpatient Medications Prior to Visit  Medication Sig Dispense Refill   aspirin 81 MG tablet Take 1 tablet (81 mg total) by mouth daily. 90 tablet 3   brimonidine (ALPHAGAN) 0.2 % ophthalmic solution Place 1 drop into both eyes 2 (two) times daily.      dorzolamide-timolol (COSOPT) 22.3-6.8 MG/ML ophthalmic solution Place 1 drop into both eyes 2 (two) times daily.      latanoprost (XALATAN) 0.005 % ophthalmic solution Place 1 drop into both eyes at bedtime.     metoprolol succinate (TOPROL-XL) 100 MG 24 hr tablet Take 1 tablet (100 mg total) by mouth daily. Take with or immediately following a meal.  Please keep upcoming appointment for future refills. 90 tablet 0   Omega-3 Fatty Acids (FISH OIL PO) Take by mouth as needed.     potassium chloride SA (KLOR-CON M15) 15 MEQ tablet Take 1 tablet (15 mEq total) by mouth 2 (two) times daily. 180 tablet 0   sildenafil (VIAGRA) 50 MG tablet TAKE 1 TABLET BY MOUTH ONCE DAILY AS NEEDED FOR ERECTILE DYSFUNCTION 30 tablet 0   simvastatin (ZOCOR) 20 MG tablet TAKE 1 TABLET BY MOUTH AT BEDTIME 90 tablet 0   amLODipine (NORVASC) 10 MG tablet Take 1 tablet (10 mg total) by mouth daily. 90 tablet 0   No facility-administered medications prior to visit.    ROS Review of Systems  Constitutional: Negative.  Negative for diaphoresis and fatigue.  HENT: Negative.    Eyes: Negative.   Respiratory:  Negative for cough, chest tightness, shortness of breath and wheezing.   Cardiovascular:  Negative for chest pain, palpitations and leg swelling.  Gastrointestinal:  Negative for abdominal pain, constipation, diarrhea, nausea and vomiting.  Endocrine: Negative.   Genitourinary: Negative.  Negative for  difficulty urinating.  Musculoskeletal: Negative.   Skin: Negative.   Neurological:  Negative for dizziness and weakness.  Hematological:  Negative for adenopathy. Does not bruise/bleed easily.  Psychiatric/Behavioral:  Negative for suicidal ideas.     Objective:  BP (!) 140/66 (BP Location: Right Arm, Patient Position: Sitting)   Pulse 63   Temp 97.9 F (36.6 C) (Oral)   Resp 16   Ht 6\' 2"  (1.88 m)   Wt 203 lb (92.1 kg)   SpO2 96%   BMI 26.06 kg/m   BP Readings from Last 3 Encounters:  08/15/22 (!) 140/66  07/10/22 136/78  05/08/22 138/68    Wt Readings from Last 3 Encounters:  08/15/22 203 lb (92.1 kg)  07/25/22 204 lb (92.5 kg)  07/10/22 204 lb (92.5 kg)    Physical Exam Vitals reviewed.  Constitutional:      Appearance: He is not ill-appearing.  HENT:     Nose: Nose normal.     Mouth/Throat:     Mouth: Mucous membranes are moist.  Eyes:     General: No scleral icterus.    Conjunctiva/sclera: Conjunctivae normal.  Cardiovascular:     Rate and Rhythm: Normal rate and regular rhythm.     Heart sounds: No murmur heard.    No gallop.  Pulmonary:     Effort: Pulmonary effort is normal.     Breath sounds: Examination of the right-upper field reveals decreased breath sounds. Examination  of the left-upper field reveals decreased breath sounds. Examination of the right-middle field reveals decreased breath sounds and rhonchi. Examination of the left-middle field reveals decreased breath sounds and rhonchi. Examination of the right-lower field reveals decreased breath sounds. Examination of the left-lower field reveals decreased breath sounds. Decreased breath sounds and rhonchi present. No wheezing or rales.  Abdominal:     General: Abdomen is flat.     Palpations: There is no mass.     Tenderness: There is no abdominal tenderness. There is no guarding.     Hernia: No hernia is present.  Musculoskeletal:        General: Normal range of motion.     Cervical back:  Neck supple.     Right lower leg: No edema.     Left lower leg: No edema.  Lymphadenopathy:     Cervical: No cervical adenopathy.  Skin:    General: Skin is warm and dry.  Neurological:     General: No focal deficit present.     Mental Status: He is alert. Mental status is at baseline.  Psychiatric:        Mood and Affect: Mood normal.        Behavior: Behavior normal.     Lab Results  Component Value Date   WBC 9.0 04/28/2022   HGB 13.7 04/28/2022   HCT 40.6 04/28/2022   PLT 186 04/28/2022   GLUCOSE 84 05/08/2022   CHOL 130 11/13/2021   TRIG 99.0 11/13/2021   HDL 38.90 (L) 11/13/2021   LDLCALC 72 11/13/2021   ALT 12 04/28/2022   AST 17 04/28/2022   NA 143 05/08/2022   K 4.0 05/08/2022   CL 106 05/08/2022   CREATININE 0.92 05/08/2022   BUN 13 05/08/2022   CO2 30 05/08/2022   TSH 3.590 04/28/2022   PSA 1.17 11/13/2021   HGBA1C 6.1 05/08/2022    DG Chest Portable 1 View  Result Date: 04/28/2022 CLINICAL DATA:  Near syncope after combination of alcohol, cocaine, and Viagra. Now feeling fatigued. EXAM: PORTABLE CHEST 1 VIEW COMPARISON:  None Available. FINDINGS: Lung volumes are low. Heart is prominent in size, however likely accentuated by low volumes and AP technique. Bronchovascular crowding versus vascular congestion. No focal airspace disease. Mild biapical pleuroparenchymal scarring. No pleural effusion or pneumothorax. No acute osseous abnormalities IMPRESSION: 1. Low lung volumes with bronchovascular crowding versus vascular congestion. 2. Prominent heart size, likely accentuated by low volumes and AP technique. Electronically Signed   By: Narda Rutherford M.D.   On: 04/28/2022 21:44    Assessment & Plan:   Primary hypertension- His blood pressure is adequately well-controlled.  Will continue the CCB -     amLODIPine Besylate; Take 1 tablet (10 mg total) by mouth daily.  Dispense: 90 tablet; Refill: 1  Hypertensive heart disease with chronic combined systolic and  diastolic congestive heart failure (HCC) -     amLODIPine Besylate; Take 1 tablet (10 mg total) by mouth daily.  Dispense: 90 tablet; Refill: 1  Cardiomyopathy, unspecified type (HCC) -     amLODIPine Besylate; Take 1 tablet (10 mg total) by mouth daily.  Dispense: 90 tablet; Refill: 1     Follow-up: Return in about 4 months (around 12/15/2022).  Sanda Linger, MD

## 2022-08-15 NOTE — Patient Instructions (Signed)
Hypertension, Adult High blood pressure (hypertension) is when the force of blood pumping through the arteries is too strong. The arteries are the blood vessels that carry blood from the heart throughout the body. Hypertension forces the heart to work harder to pump blood and may cause arteries to become narrow or stiff. Untreated or uncontrolled hypertension can lead to a heart attack, heart failure, a stroke, kidney disease, and other problems. A blood pressure reading consists of a higher number over a lower number. Ideally, your blood pressure should be below 120/80. The first ("top") number is called the systolic pressure. It is a measure of the pressure in your arteries as your heart beats. The second ("bottom") number is called the diastolic pressure. It is a measure of the pressure in your arteries as the heart relaxes. What are the causes? The exact cause of this condition is not known. There are some conditions that result in high blood pressure. What increases the risk? Certain factors may make you more likely to develop high blood pressure. Some of these risk factors are under your control, including: Smoking. Not getting enough exercise or physical activity. Being overweight. Having too much fat, sugar, calories, or salt (sodium) in your diet. Drinking too much alcohol. Other risk factors include: Having a personal history of heart disease, diabetes, high cholesterol, or kidney disease. Stress. Having a family history of high blood pressure and high cholesterol. Having obstructive sleep apnea. Age. The risk increases with age. What are the signs or symptoms? High blood pressure may not cause symptoms. Very high blood pressure (hypertensive crisis) may cause: Headache. Fast or irregular heartbeats (palpitations). Shortness of breath. Nosebleed. Nausea and vomiting. Vision changes. Severe chest pain, dizziness, and seizures. How is this diagnosed? This condition is diagnosed by  measuring your blood pressure while you are seated, with your arm resting on a flat surface, your legs uncrossed, and your feet flat on the floor. The cuff of the blood pressure monitor will be placed directly against the skin of your upper arm at the level of your heart. Blood pressure should be measured at least twice using the same arm. Certain conditions can cause a difference in blood pressure between your right and left arms. If you have a high blood pressure reading during one visit or you have normal blood pressure with other risk factors, you may be asked to: Return on a different day to have your blood pressure checked again. Monitor your blood pressure at home for 1 week or longer. If you are diagnosed with hypertension, you may have other blood or imaging tests to help your health care provider understand your overall risk for other conditions. How is this treated? This condition is treated by making healthy lifestyle changes, such as eating healthy foods, exercising more, and reducing your alcohol intake. You may be referred for counseling on a healthy diet and physical activity. Your health care provider may prescribe medicine if lifestyle changes are not enough to get your blood pressure under control and if: Your systolic blood pressure is above 130. Your diastolic blood pressure is above 80. Your personal target blood pressure may vary depending on your medical conditions, your age, and other factors. Follow these instructions at home: Eating and drinking  Eat a diet that is high in fiber and potassium, and low in sodium, added sugar, and fat. An example of this eating plan is called the DASH diet. DASH stands for Dietary Approaches to Stop Hypertension. To eat this way: Eat   plenty of fresh fruits and vegetables. Try to fill one half of your plate at each meal with fruits and vegetables. Eat whole grains, such as whole-wheat pasta, brown rice, or whole-grain bread. Fill about one  fourth of your plate with whole grains. Eat or drink low-fat dairy products, such as skim milk or low-fat yogurt. Avoid fatty cuts of meat, processed or cured meats, and poultry with skin. Fill about one fourth of your plate with lean proteins, such as fish, chicken without skin, beans, eggs, or tofu. Avoid pre-made and processed foods. These tend to be higher in sodium, added sugar, and fat. Reduce your daily sodium intake. Many people with hypertension should eat less than 1,500 mg of sodium a day. Do not drink alcohol if: Your health care provider tells you not to drink. You are pregnant, may be pregnant, or are planning to become pregnant. If you drink alcohol: Limit how much you have to: 0-1 drink a day for women. 0-2 drinks a day for men. Know how much alcohol is in your drink. In the [place] one drink equals one 12 oz bottle of beer (355 mL), one 5 oz glass of wine (148 mL), or one 1 oz glass of hard liquor (44 mL). Lifestyle  Work with your health care provider to maintain a healthy body weight or to lose weight. Ask what an ideal weight is for you. Get at least 30 minutes of exercise that causes your heart to beat faster (aerobic exercise) most days of the week. Activities may include walking, swimming, or biking. Include exercise to strengthen your muscles (resistance exercise), such as Pilates or lifting weights, as part of your weekly exercise routine. Try to do these types of exercises for 30 minutes at least 3 days a week. Do not use any products that contain nicotine or tobacco. These products include cigarettes, chewing tobacco, and vaping devices, such as e-cigarettes. If you need help quitting, ask your health care provider. Monitor your blood pressure at home as told by your health care provider. Keep all follow-up visits. This is important. Medicines Take over-the-counter and prescription medicines only as told by your health care provider. Follow directions carefully. Blood  pressure medicines must be taken as prescribed. Do not skip doses of blood pressure medicine. Doing this puts you at risk for problems and can make the medicine less effective. Ask your health care provider about side effects or reactions to medicines that you should watch for. Contact a health care provider if you: Think you are having a reaction to a medicine you are taking. Have headaches that keep coming back (recurring). Feel dizzy. Have swelling in your ankles. Have trouble with your vision. Get help right away if you: Develop a severe headache or confusion. Have unusual weakness or numbness. Feel faint. Have severe pain in your chest or abdomen. Vomit repeatedly. Have trouble breathing. These symptoms may be an emergency. Get help right away. Call 911. Do not wait to see if the symptoms will go away. Do not drive yourself to the hospital. Summary Hypertension is when the force of blood pumping through your arteries is too strong. If this condition is not controlled, it may put you at risk for serious complications. Your personal target blood pressure may vary depending on your medical conditions, your age, and other factors. For most people, a normal blood pressure is less than 120/80. Hypertension is treated with lifestyle changes, medicines, or a combination of both. Lifestyle changes include losing weight, eating a healthy,   low-sodium diet, exercising more, and limiting alcohol. This information is not intended to replace advice given to you by your health care provider. Make sure you discuss any questions you have with your health care provider. Document Revised: 12/26/2020 Document Reviewed: 12/26/2020 Elsevier Patient Education  2024 [employer]  

## 2022-08-17 MED ORDER — AMLODIPINE BESYLATE 10 MG PO TABS: 10.0000 mg | ORAL_TABLET | Freq: Every day | ORAL | 1 refills | Status: DC

## 2022-08-24 ENCOUNTER — Other Ambulatory Visit: Payer: Self-pay | Admitting: Internal Medicine

## 2022-08-24 DIAGNOSIS — E785 Hyperlipidemia, unspecified: Secondary | ICD-10-CM

## 2022-11-06 ENCOUNTER — Other Ambulatory Visit: Payer: Self-pay | Admitting: Cardiology

## 2022-11-12 ENCOUNTER — Telehealth: Payer: Self-pay | Admitting: *Deleted

## 2022-11-12 ENCOUNTER — Ambulatory Visit (INDEPENDENT_AMBULATORY_CARE_PROVIDER_SITE_OTHER): Payer: Medicare HMO | Admitting: Internal Medicine

## 2022-11-12 ENCOUNTER — Encounter: Payer: Self-pay | Admitting: Internal Medicine

## 2022-11-12 VITALS — BP 154/76 | HR 60 | Temp 97.6°F | Resp 16 | Ht 74.0 in | Wt 196.0 lb

## 2022-11-12 DIAGNOSIS — T502X5A Adverse effect of carbonic-anhydrase inhibitors, benzothiadiazides and other diuretics, initial encounter: Secondary | ICD-10-CM

## 2022-11-12 DIAGNOSIS — R7303 Prediabetes: Secondary | ICD-10-CM

## 2022-11-12 DIAGNOSIS — E876 Hypokalemia: Secondary | ICD-10-CM

## 2022-11-12 DIAGNOSIS — E785 Hyperlipidemia, unspecified: Secondary | ICD-10-CM | POA: Diagnosis not present

## 2022-11-12 DIAGNOSIS — Z0001 Encounter for general adult medical examination with abnormal findings: Secondary | ICD-10-CM

## 2022-11-12 DIAGNOSIS — I429 Cardiomyopathy, unspecified: Secondary | ICD-10-CM

## 2022-11-12 DIAGNOSIS — N400 Enlarged prostate without lower urinary tract symptoms: Secondary | ICD-10-CM | POA: Diagnosis not present

## 2022-11-12 DIAGNOSIS — Z23 Encounter for immunization: Secondary | ICD-10-CM | POA: Insufficient documentation

## 2022-11-12 DIAGNOSIS — F17200 Nicotine dependence, unspecified, uncomplicated: Secondary | ICD-10-CM

## 2022-11-12 DIAGNOSIS — I10 Essential (primary) hypertension: Secondary | ICD-10-CM

## 2022-11-12 LAB — BASIC METABOLIC PANEL
BUN: 9 mg/dL (ref 6–23)
CO2: 30 meq/L (ref 19–32)
Calcium: 9.6 mg/dL (ref 8.4–10.5)
Chloride: 104 meq/L (ref 96–112)
Creatinine, Ser: 0.77 mg/dL (ref 0.40–1.50)
GFR: 88.86 mL/min (ref >60.00)
Glucose, Bld: 100 mg/dL — ABNORMAL HIGH (ref 70–99)
Potassium: 3.9 meq/L (ref 3.5–5.1)
Sodium: 141 meq/L (ref 135–145)

## 2022-11-12 LAB — CBC WITH DIFFERENTIAL/PLATELET
Basophils Absolute: 0 10*3/uL (ref 0.0–0.1)
Basophils Relative: 0.4 % (ref 0.0–3.0)
Eosinophils Absolute: 0.1 10*3/uL (ref 0.0–0.7)
Eosinophils Relative: 0.8 % (ref 0.0–5.0)
HCT: 45.4 % (ref 39.0–52.0)
Hemoglobin: 14.7 g/dL (ref 13.0–17.0)
Lymphocytes Relative: 28.3 % (ref 12.0–46.0)
Lymphs Abs: 2.1 10*3/uL (ref 0.7–4.0)
MCHC: 32.3 g/dL (ref 30.0–36.0)
MCV: 87.7 fl (ref 78.0–100.0)
Monocytes Absolute: 0.8 10*3/uL (ref 0.1–1.0)
Monocytes Relative: 11.5 % (ref 3.0–12.0)
Neutro Abs: 4.3 10*3/uL (ref 1.4–7.7)
Neutrophils Relative %: 59 % (ref 43.0–77.0)
Platelets: 184 10*3/uL (ref 150.0–400.0)
RBC: 5.17 Mil/uL (ref 4.22–5.81)
RDW: 16 % — ABNORMAL HIGH (ref 11.5–15.5)
WBC: 7.3 10*3/uL (ref 4.0–10.5)

## 2022-11-12 LAB — LIPID PANEL
Cholesterol: 169 mg/dL (ref 0–200)
HDL: 55.5 mg/dL (ref >39.00)
LDL Cholesterol: 101 mg/dL — ABNORMAL HIGH (ref 0–99)
NonHDL: 113.83
Total CHOL/HDL Ratio: 3
Triglycerides: 65 mg/dL (ref 0.0–149.0)
VLDL: 13 mg/dL (ref 0.0–40.0)

## 2022-11-12 LAB — URINALYSIS, ROUTINE W REFLEX MICROSCOPIC
Bilirubin Urine: NEGATIVE
Ketones, ur: NEGATIVE
Leukocytes,Ua: NEGATIVE
Nitrite: NEGATIVE
Specific Gravity, Urine: >=1.03 — AB (ref 1.000–1.030)
Total Protein, Urine: 100 — AB
Urine Glucose: NEGATIVE
Urobilinogen, UA: 2 — AB (ref 0.0–1.0)
pH: 6 (ref 5.0–8.0)

## 2022-11-12 LAB — HEMOGLOBIN A1C: Hgb A1c MFr Bld: 5.8 % (ref 4.6–6.5)

## 2022-11-12 LAB — HEPATIC FUNCTION PANEL
ALT: 31 U/L (ref 0–53)
AST: 29 U/L (ref 0–37)
Albumin: 4.3 g/dL (ref 3.5–5.2)
Alkaline Phosphatase: 73 U/L (ref 39–117)
Bilirubin, Direct: 0.1 mg/dL (ref 0.0–0.3)
Total Bilirubin: 0.6 mg/dL (ref 0.2–1.2)
Total Protein: 7.5 g/dL (ref 6.0–8.3)

## 2022-11-12 LAB — PSA: PSA: 1.71 ng/mL (ref 0.10–4.00)

## 2022-11-12 MED ORDER — SPIRONOLACTONE 25 MG PO TABS: 25.0000 mg | ORAL_TABLET | Freq: Every day | ORAL | 1 refills | Status: DC

## 2022-11-12 MED ORDER — BOOSTRIX 5-2.5-18.5 LF-MCG/0.5 IM SUSP: 0.5000 mL | Freq: Once | INTRAMUSCULAR | 0 refills | Status: AC

## 2022-11-12 MED ORDER — SHINGRIX 50 MCG/0.5ML IM SUSR: 0.5000 mL | Freq: Once | INTRAMUSCULAR | 1 refills | Status: AC

## 2022-11-12 NOTE — Patient Instructions (Signed)
Health Maintenance, Male Adopting a healthy lifestyle and getting preventive care are important in promoting health and wellness. Ask your health care provider about: The right schedule for you to have regular tests and exams. Things you can do on your own to prevent diseases and keep yourself healthy. What should I know about diet, weight, and exercise? Eat a healthy diet  Eat a diet that includes plenty of vegetables, fruits, low-fat dairy products, and lean protein. Do not eat a lot of foods that are high in solid fats, added sugars, or sodium. Maintain a healthy weight Body mass index (BMI) is a measurement that can be used to identify possible weight problems. It estimates body fat based on height and weight. Your health care provider can help determine your BMI and help you achieve or maintain a healthy weight. Get regular exercise Get regular exercise. This is one of the most important things you can do for your health. Most adults should: Exercise for at least 150 minutes each week. The exercise should increase your heart rate and make you sweat (moderate-intensity exercise). Do strengthening exercises at least twice a week. This is in addition to the moderate-intensity exercise. Spend less time sitting. Even light physical activity can be beneficial. Watch cholesterol and blood lipids Have your blood tested for lipids and cholesterol at 73 years of age, then have this test every 5 years. You may need to have your cholesterol levels checked more often if: Your lipid or cholesterol levels are high. You are older than 73 years of age. You are at high risk for heart disease. What should I know about cancer screening? Many types of cancers can be detected early and may often be prevented. Depending on your health history and family history, you may need to have cancer screening at various ages. This may include screening for: Colorectal cancer. Prostate cancer. Skin cancer. Lung  cancer. What should I know about heart disease, diabetes, and high blood pressure? Blood pressure and heart disease High blood pressure causes heart disease and increases the risk of stroke. This is more likely to develop in people who have high blood pressure readings or are overweight. Talk with your health care provider about your target blood pressure readings. Have your blood pressure checked: Every 3-5 years if you are 18-39 years of age. Every year if you are 40 years old or older. If you are between the ages of 65 and 75 and are a current or former smoker, ask your health care provider if you should have a one-time screening for abdominal aortic aneurysm (AAA). Diabetes Have regular diabetes screenings. This checks your fasting blood sugar level. Have the screening done: Once every three years after age 45 if you are at a normal weight and have a low risk for diabetes. More often and at a younger age if you are overweight or have a high risk for diabetes. What should I know about preventing infection? Hepatitis B If you have a higher risk for hepatitis B, you should be screened for this virus. Talk with your health care provider to find out if you are at risk for hepatitis B infection. Hepatitis C Blood testing is recommended for: Everyone born from 1945 through 1965. Anyone with known risk factors for hepatitis C. Sexually transmitted infections (STIs) You should be screened each year for STIs, including gonorrhea and chlamydia, if: You are sexually active and are younger than 73 years of age. You are older than 73 years of age and your   health care provider tells you that you are at risk for this type of infection. Your sexual activity has changed since you were last screened, and you are at increased risk for chlamydia or gonorrhea. Ask your health care provider if you are at risk. Ask your health care provider about whether you are at high risk for HIV. Your health care provider  may recommend a prescription medicine to help prevent HIV infection. If you choose to take medicine to prevent HIV, you should first get tested for HIV. You should then be tested every 3 months for as long as you are taking the medicine. Follow these instructions at home: Alcohol use Do not drink alcohol if your health care provider tells you not to drink. If you drink alcohol: Limit how much you have to 0-2 drinks a day. Know how much alcohol is in your drink. In the [place] one drink equals one 12 oz bottle of beer (355 mL), one 5 oz glass of wine (148 mL), or one 1 oz glass of hard liquor (44 mL). Lifestyle Do not use any products that contain nicotine or tobacco. These products include cigarettes, chewing tobacco, and vaping devices, such as e-cigarettes. If you need help quitting, ask your health care provider. Do not use street drugs. Do not share needles. Ask your health care provider for help if you need support or information about quitting drugs. General instructions Schedule regular health, dental, and eye exams. Stay current with your vaccines. Tell your health care provider if: You often feel depressed. You have ever been abused or do not feel safe at home. Summary Adopting a healthy lifestyle and getting preventive care are important in promoting health and wellness. Follow your health care provider's instructions about healthy diet, exercising, and getting tested or screened for diseases. Follow your health care provider's instructions on monitoring your cholesterol and blood pressure. This information is not intended to replace advice given to you by your health care provider. Make sure you discuss any questions you have with your health care provider. Document Revised: 07/10/2020 Document Reviewed: 07/10/2020 Elsevier Patient Education  2024 [employer]  

## 2022-11-12 NOTE — Progress Notes (Signed)
Subjective:  Patient ID: Nathan Stafford, male    DOB: [birth year]  Age: 72 y.o. MRN: [id number]  CC: Annual Exam, Congestive Heart Failure, and Hypertension   HPI Jamaree Scalia presents for a CPX and f/up ----   Discussed the use of AI scribe software for clinical note transcription with the patient, who gave verbal consent to proceed.  History of Present Illness   The patient, with a history of smoking and alcohol consumption, presents for a routine check-up. He reports no chest pain, shortness of breath, coughing, or wheezing. He has been trying to quit smoking and has reduced their consumption to half a pack a day from a pack a day. He consumes alcohol, specifically beer, only during football games on Sundays, amounting to approximately a six-pack a week.  The patient denies any issues with urination. He has recently seen a cardiologist and had an EKG performed, details of which are not provided.  Additionally, the patient has been approved for home health care for 38 hours a week or eight hours a day. He is currently working part-time and remains active.  Lastly, the patient mentions a need for prescription refills, but the specific medications are not discussed.       Outpatient Medications Prior to Visit  Medication Sig Dispense Refill   amLODipine (NORVASC) 10 MG tablet Take 1 tablet (10 mg total) by mouth daily. 90 tablet 1   aspirin 81 MG tablet Take 1 tablet (81 mg total) by mouth daily. 90 tablet 3   brimonidine (ALPHAGAN) 0.2 % ophthalmic solution Place 1 drop into both eyes 2 (two) times daily.      dorzolamide-timolol (COSOPT) 22.3-6.8 MG/ML ophthalmic solution Place 1 drop into both eyes 2 (two) times daily.      latanoprost (XALATAN) 0.005 % ophthalmic solution Place 1 drop into both eyes at bedtime.     metoprolol succinate (TOPROL-XL) 100 MG 24 hr tablet Take 1 tablet (100 mg total) by mouth daily. 90 tablet 2   Omega-3 Fatty Acids (FISH OIL PO) Take by mouth as needed.      sildenafil (VIAGRA) 50 MG tablet TAKE 1 TABLET BY MOUTH ONCE DAILY AS NEEDED FOR ERECTILE DYSFUNCTION 30 tablet 0   simvastatin (ZOCOR) 20 MG tablet TAKE 1 TABLET BY MOUTH AT BEDTIME 90 tablet 0   potassium chloride SA (KLOR-CON M15) 15 MEQ tablet Take 1 tablet (15 mEq total) by mouth 2 (two) times daily. 180 tablet 0   No facility-administered medications prior to visit.    ROS Review of Systems  Constitutional: Negative.  Negative for diaphoresis and fatigue.  HENT: Negative.    Eyes: Negative.  Negative for visual disturbance.  Respiratory:  Negative for cough, chest tightness, shortness of breath and wheezing.   Cardiovascular:  Negative for chest pain, palpitations and leg swelling.  Gastrointestinal: Negative.  Negative for abdominal pain, constipation, diarrhea and nausea.  Genitourinary: Negative.  Negative for difficulty urinating.  Musculoskeletal: Negative.  Negative for arthralgias and myalgias.  Neurological:  Negative for dizziness, weakness and light-headedness.  Hematological:  Negative for adenopathy. Does not bruise/bleed easily.  Psychiatric/Behavioral: Negative.      Objective:  BP (!) 154/76 (BP Location: Left Arm, Patient Position: Sitting, Cuff Size: Normal)   Pulse 60   Temp 97.6 F (36.4 C) (Oral)   Resp 16   Ht 6\' 2"  (1.88 m)   Wt 196 lb (88.9 kg)   SpO2 99%   BMI 25.16 kg/m   BP Readings from Last  3 Encounters:  11/12/22 (!) 154/76  08/15/22 (!) 140/66  07/10/22 136/78    Wt Readings from Last 3 Encounters:  11/12/22 196 lb (88.9 kg)  08/15/22 203 lb (92.1 kg)  07/25/22 204 lb (92.5 kg)    Physical Exam Vitals reviewed.  HENT:     Mouth/Throat:     Mouth: Mucous membranes are moist.  Eyes:     General: No scleral icterus.    Conjunctiva/sclera: Conjunctivae normal.  Cardiovascular:     Rate and Rhythm: Normal rate and regular rhythm.     Pulses:          Carotid pulses are 1+ on the right side and 1+ on the left side.      Radial  pulses are 1+ on the right side and 1+ on the left side.       Femoral pulses are 1+ on the right side and 1+ on the left side.      Popliteal pulses are 1+ on the right side and 1+ on the left side.       Dorsalis pedis pulses are 1+ on the right side and 1+ on the left side.       Posterior tibial pulses are 1+ on the right side and 1+ on the left side.     Heart sounds: No murmur heard.    No gallop.  Pulmonary:     Effort: Pulmonary effort is normal.     Breath sounds: No stridor. No wheezing, rhonchi or rales.  Abdominal:     General: Abdomen is flat.     Palpations: There is no mass.     Tenderness: There is no abdominal tenderness. There is no guarding.     Hernia: No hernia is present. There is no hernia in the left inguinal area or right inguinal area.  Genitourinary:    Pubic Area: No rash.      Penis: Normal and circumcised.      Testes: Normal.     Epididymis:     Right: Normal.     Left: Normal.     Prostate: Enlarged. Not tender and no nodules present.     Rectum: Normal. Guaiac result negative. No mass, tenderness, anal fissure, external hemorrhoid or internal hemorrhoid. Normal anal tone.  Musculoskeletal:     Cervical back: Neck supple.     Right lower leg: No edema.     Left lower leg: No edema.  Lymphadenopathy:     Cervical: No cervical adenopathy.     Lower Body: No right inguinal adenopathy. No left inguinal adenopathy.  Skin:    General: Skin is warm and dry.     Findings: No rash.  Neurological:     General: No focal deficit present.     Mental Status: He is alert and oriented to person, place, and time. Mental status is at baseline.  Psychiatric:        Mood and Affect: Mood normal.        Behavior: Behavior normal.     Lab Results  Component Value Date   WBC 7.3 11/12/2022   HGB 14.7 11/12/2022   HCT 45.4 11/12/2022   PLT 184.0 11/12/2022   GLUCOSE 100 (H) 11/12/2022   CHOL 169 11/12/2022   TRIG 65.0 11/12/2022   HDL 55.50 11/12/2022    LDLCALC 101 (H) 11/12/2022   ALT 31 11/12/2022   AST 29 11/12/2022   NA 141 11/12/2022   K 3.9 11/12/2022   CL 104 11/12/2022  CREATININE 0.77 11/12/2022   BUN 9 11/12/2022   CO2 30 11/12/2022   TSH 3.590 04/28/2022   PSA 1.71 11/12/2022   HGBA1C 5.8 11/12/2022    DG Chest Portable 1 View  Result Date: 04/28/2022 CLINICAL DATA:  Near syncope after combination of alcohol, cocaine, and Viagra. Now feeling fatigued. EXAM: PORTABLE CHEST 1 VIEW COMPARISON:  None Available. FINDINGS: Lung volumes are low. Heart is prominent in size, however likely accentuated by low volumes and AP technique. Bronchovascular crowding versus vascular congestion. No focal airspace disease. Mild biapical pleuroparenchymal scarring. No pleural effusion or pneumothorax. No acute osseous abnormalities IMPRESSION: 1. Low lung volumes with bronchovascular crowding versus vascular congestion. 2. Prominent heart size, likely accentuated by low volumes and AP technique. Electronically Signed   By: Narda Rutherford M.D.   On: 04/28/2022 21:44    Assessment & Plan:   Primary hypertension- His BP is not at goal. Will add a MRA. -     Basic metabolic panel; Future -     CBC with Differential/Platelet; Future -     Hepatic function panel; Future -     Spironolactone; Take 1 tablet (25 mg total) by mouth daily.  Dispense: 90 tablet; Refill: 1  Prediabetes -     Basic metabolic panel; Future -     Hemoglobin A1c; Future  Benign prostatic hyperplasia without lower urinary tract symptoms -     PSA; Future -     Urinalysis, Routine w reflex microscopic; Future  Diuretic-induced hypokalemia -     Basic metabolic panel; Future -     Spironolactone; Take 1 tablet (25 mg total) by mouth daily.  Dispense: 90 tablet; Refill: 1  Dyslipidemia, goal LDL below 70- LDL goal achieved. Doing well on the statin  -     Lipid panel; Future -     Hepatic function panel; Future  Cardiomyopathy, unspecified type (HCC) -     AMB  Referral to [facility] Coordinaton ([facility] Patients) -     Spironolactone; Take 1 tablet (25 mg total) by mouth daily.  Dispense: 90 tablet; Refill: 1  Encounter for general adult medical examination with abnormal findings- Exam completed, labs reviewed, vaccines reviewed and updated, cancer screenings addressed, pt ed material was given.   Current smoker -     Ambulatory Referral for Lung Cancer Scre  Need for prophylactic vaccination with combined diphtheria-tetanus-pertussis (DTP) vaccine -     Boostrix; Inject 0.5 mLs into the muscle once for 1 dose.  Dispense: 0.5 mL; Refill: 0  Need for prophylactic vaccination and inoculation against varicella -     Shingrix; Inject 0.5 mLs into the muscle once for 1 dose.  Dispense: 0.5 mL; Refill: 1     Follow-up: Return in about 6 months (around 05/12/2023).  Sanda Linger, MD

## 2022-11-12 NOTE — Progress Notes (Signed)
  Care Coordination   Note   11/12/2022 Name: Nathan Stafford MRN: [id number] DOB: 22-Aug-1950  Nathan Stafford is a 72 y.o. year old male who sees Etta Grandchild, MD for primary care. I reached out to Baldwin Crown by phone today to offer care coordination services.  [name] was given information about Care Coordination services today including:   The Care Coordination services include support from the care team which includes your Nurse Coordinator, Clinical Social Worker, or Pharmacist.  The Care Coordination team is here to help remove barriers to the health concerns and goals most important to you. Care Coordination services are voluntary, and the patient may decline or stop services at any time by request to their care team member.   Care Coordination Consent Status: Patient agreed to services and verbal consent obtained.   Follow up plan:  Telephone appointment with care coordination team member scheduled for:  11/21/2022  Encounter Outcome:  Patient Scheduled from referral   Burman Nieves, [facility] Care Coordination Care Guide Direct Dial: [phone]

## 2022-11-13 ENCOUNTER — Encounter: Payer: Self-pay | Admitting: Internal Medicine

## 2022-11-21 ENCOUNTER — Ambulatory Visit: Payer: Self-pay

## 2022-11-21 NOTE — Patient Outreach (Signed)
Care Coordination   Initial Visit Note   11/21/2022 Name: Torrie Rossmiller MRN: [id number] DOB: 1950/12/21  Ranveer Kwiatkowski is a 72 y.o. year old male who sees Etta Grandchild, MD for primary care. I spoke with  Baldwin Crown by phone today.  What matters to the patients health and wellness today?  Patient request information on housekeeping services.    Goals Addressed             This Visit's Progress    Information on Personal Care       Interventions Today    Flowsheet Row Most Recent Value  Chronic Disease   Chronic disease during today's visit Hypertension (HTN)  General Interventions   General Interventions Discussed/Reviewed General Interventions Discussed, General Interventions Reviewed  [Pt requests information on personal care to assist with housekeeping.Pt insurance provided confusing information. Pt is not able to pay private pay and has not been in the hospital since February. Pt reports he can perform his ADL's on his own.]              SDOH assessments and interventions completed:  Yes  SDOH Interventions Today    Flowsheet Row Most Recent Value  SDOH Interventions   Food Insecurity Interventions Intervention Not Indicated  Housing Interventions Intervention Not Indicated  Transportation Interventions Intervention Not Indicated  Utilities Interventions Intervention Not Indicated        Care Coordination Interventions:  Yes, provided   Follow up plan: No further intervention required.   Encounter Outcome:  Patient Visit Completed

## 2022-11-21 NOTE — Patient Instructions (Signed)
Visit Information  Thank you for taking time to visit with me today. Please don't hesitate to contact me if I can be of assistance to you.   Following are the goals we discussed today:  Patient is informed of personal care options but he can not afford private pay.   If you are experiencing a Mental Health or Behavioral Health Crisis or need someone to talk to, please call 911  Patient verbalizes understanding of instructions and care plan provided today and agrees to view in MyChart. Active MyChart status and patient understanding of how to access instructions and care plan via MyChart confirmed with patient.     No further follow up required: Patient does not request a follow up visit.  Lysle Morales, BSW Social Worker [phone]

## 2022-11-27 ENCOUNTER — Telehealth: Payer: Self-pay | Admitting: Internal Medicine

## 2022-11-27 NOTE — Telephone Encounter (Signed)
Per Epic I do not see such a letter was sent besides a lab result note.   Pt informed.

## 2022-11-27 NOTE — Telephone Encounter (Signed)
Patient called and said he received a letter from our office requesting that he come back for an appointment with Dr. Yetta Barre in October. He is not sure why he would come back since he was just seen 11/12/22. Patient would like a call back to clarify at [phone].

## 2022-12-16 ENCOUNTER — Ambulatory Visit: Payer: Medicare HMO | Admitting: Internal Medicine

## 2023-01-28 ENCOUNTER — Telehealth: Payer: Self-pay | Admitting: Pharmacist

## 2023-01-28 DIAGNOSIS — I429 Cardiomyopathy, unspecified: Secondary | ICD-10-CM

## 2023-01-28 DIAGNOSIS — E785 Hyperlipidemia, unspecified: Secondary | ICD-10-CM

## 2023-01-28 DIAGNOSIS — I10 Essential (primary) hypertension: Secondary | ICD-10-CM

## 2023-01-28 DIAGNOSIS — I5042 Chronic combined systolic (congestive) and diastolic (congestive) heart failure: Secondary | ICD-10-CM

## 2023-01-28 MED ORDER — SIMVASTATIN 20 MG PO TABS: 20.0000 mg | ORAL_TABLET | Freq: Every day | ORAL | 0 refills | Status: DC

## 2023-01-28 MED ORDER — AMLODIPINE BESYLATE 10 MG PO TABS: 10.0000 mg | ORAL_TABLET | Freq: Every day | ORAL | 0 refills | Status: DC

## 2023-01-28 NOTE — Telephone Encounter (Addendum)
Pharmacy Quality Measure Review  This patient is appearing on a report for being at risk of failing the adherence measure for cholesterol (statin) medications this calendar year.   Medication: simvastatin Last fill date: 10/01/2022 for 90 day supply  Simvastatin is about a month late for refill and patient will fail the MAC metric today 01/28/2023.  Spoke to patient, he notes he has a few pills left of simvastatin and was planning to call today for refill. Noted he has 0 refills remaining at the pharmacy, Will send refill.  Pt also notes he needs amlodipine refill.  Arbutus Leas, PharmD, BCPS, CPP Clinical Pharmacist Practitioner [facility] [phone]

## 2023-02-27 DIAGNOSIS — H5203 Hypermetropia, bilateral: Secondary | ICD-10-CM | POA: Diagnosis not present

## 2023-05-03 ENCOUNTER — Other Ambulatory Visit: Payer: Self-pay | Admitting: Internal Medicine

## 2023-05-03 DIAGNOSIS — E785 Hyperlipidemia, unspecified: Secondary | ICD-10-CM

## 2023-05-13 ENCOUNTER — Other Ambulatory Visit: Payer: Self-pay | Admitting: Emergency Medicine

## 2023-05-13 DIAGNOSIS — I429 Cardiomyopathy, unspecified: Secondary | ICD-10-CM

## 2023-06-11 ENCOUNTER — Other Ambulatory Visit: Payer: Self-pay | Admitting: Internal Medicine

## 2023-06-11 DIAGNOSIS — E785 Hyperlipidemia, unspecified: Secondary | ICD-10-CM

## 2023-06-17 ENCOUNTER — Other Ambulatory Visit: Payer: Self-pay | Admitting: Internal Medicine

## 2023-06-17 DIAGNOSIS — E785 Hyperlipidemia, unspecified: Secondary | ICD-10-CM

## 2023-07-08 DIAGNOSIS — Z8249 Family history of ischemic heart disease and other diseases of the circulatory system: Secondary | ICD-10-CM | POA: Diagnosis not present

## 2023-07-08 DIAGNOSIS — F17210 Nicotine dependence, cigarettes, uncomplicated: Secondary | ICD-10-CM | POA: Diagnosis not present

## 2023-07-08 DIAGNOSIS — Z7982 Long term (current) use of aspirin: Secondary | ICD-10-CM | POA: Diagnosis not present

## 2023-07-08 DIAGNOSIS — J449 Chronic obstructive pulmonary disease, unspecified: Secondary | ICD-10-CM | POA: Diagnosis not present

## 2023-07-08 DIAGNOSIS — N400 Enlarged prostate without lower urinary tract symptoms: Secondary | ICD-10-CM | POA: Diagnosis not present

## 2023-07-08 DIAGNOSIS — N529 Male erectile dysfunction, unspecified: Secondary | ICD-10-CM | POA: Diagnosis not present

## 2023-07-08 DIAGNOSIS — Z888 Allergy status to other drugs, medicaments and biological substances status: Secondary | ICD-10-CM | POA: Diagnosis not present

## 2023-07-08 DIAGNOSIS — H40159 Residual stage of open-angle glaucoma, unspecified eye: Secondary | ICD-10-CM | POA: Diagnosis not present

## 2023-07-08 DIAGNOSIS — Z008 Encounter for other general examination: Secondary | ICD-10-CM | POA: Diagnosis not present

## 2023-07-08 DIAGNOSIS — Z833 Family history of diabetes mellitus: Secondary | ICD-10-CM | POA: Diagnosis not present

## 2023-07-08 DIAGNOSIS — Z809 Family history of malignant neoplasm, unspecified: Secondary | ICD-10-CM | POA: Diagnosis not present

## 2023-07-08 DIAGNOSIS — E785 Hyperlipidemia, unspecified: Secondary | ICD-10-CM | POA: Diagnosis not present

## 2023-07-08 DIAGNOSIS — I10 Essential (primary) hypertension: Secondary | ICD-10-CM | POA: Diagnosis not present

## 2023-07-16 ENCOUNTER — Telehealth: Payer: Self-pay

## 2023-07-16 NOTE — Telephone Encounter (Signed)
 This patient is appearing on a report for being at risk of failing the adherence measure for cholesterol (statin) medications this calendar year.   Medication: simvastatin  20 mg daily Last fill date: 05/06/23 for 30 day supply  A little over a month late on simvastatin  refill. Per the current active script, patient needs to make an appointment with Dr. Rochelle Chu for further refills. Only an AWV is currently scheduled for 07/30/23. Called patient but went straight to voicemail and unable to leave message due to full mailbox. Further attempts to be made.   Abelina Abide, PharmD PGY1 Pharmacy Resident 07/16/2023 2:06 PM

## 2023-07-18 ENCOUNTER — Other Ambulatory Visit: Payer: Self-pay | Admitting: Internal Medicine

## 2023-07-18 DIAGNOSIS — E785 Hyperlipidemia, unspecified: Secondary | ICD-10-CM

## 2023-07-23 NOTE — Telephone Encounter (Signed)
 Patient needs to schedule an appointment with Dr. Rochelle Chu for more simvastatin  refills. Appears request was sent to Dr. Rochelle Chu on 5/16 but this was denied.  Called patient but went straight to voicemail and unable to leave message due to full mailbox x3. Unable to contact patient at this time.  Patient will failure adherence measure if not filled by 08/04/23.  Abelina Abide, PharmD PGY1 Pharmacy Resident 07/23/2023 9:23 AM

## 2023-07-24 ENCOUNTER — Other Ambulatory Visit: Payer: Self-pay | Admitting: Internal Medicine

## 2023-07-24 DIAGNOSIS — E785 Hyperlipidemia, unspecified: Secondary | ICD-10-CM

## 2023-07-30 ENCOUNTER — Ambulatory Visit (INDEPENDENT_AMBULATORY_CARE_PROVIDER_SITE_OTHER): Payer: Medicare HMO

## 2023-07-30 VITALS — Ht 73.0 in | Wt 196.0 lb

## 2023-07-30 DIAGNOSIS — Z87891 Personal history of nicotine dependence: Secondary | ICD-10-CM

## 2023-07-30 DIAGNOSIS — Z0000 Encounter for general adult medical examination without abnormal findings: Secondary | ICD-10-CM

## 2023-07-30 DIAGNOSIS — Z122 Encounter for screening for malignant neoplasm of respiratory organs: Secondary | ICD-10-CM

## 2023-07-30 NOTE — Progress Notes (Signed)
 Subjective:   Nathan Stafford is a 73 y.o. who presents for a Medicare Wellness preventive visit.  As a reminder, Annual Wellness Visits don't include a physical exam, and some assessments may be limited, especially if this visit is performed virtually. We may recommend an in-person follow-up visit with your provider if needed.  Visit Complete: Virtual I connected with  Rae Bugler on 07/30/23 by a audio enabled telemedicine application and verified that I am speaking with the correct person using two identifiers.  Patient Location: Home  Provider Location: Office/Clinic  I discussed the limitations of evaluation and management by telemedicine. The patient expressed understanding and agreed to proceed.  Vital Signs: Because this visit was a virtual/telehealth visit, some criteria may be missing or patient reported. Any vitals not documented were not able to be obtained and vitals that have been documented are patient reported.  VideoDeclined- This patient declined Librarian, academic. Therefore the visit was completed with audio only.  Persons Participating in Visit: Patient.  AWV Questionnaire: No: Patient Medicare AWV questionnaire was not completed prior to this visit.  Cardiac Risk Factors include: advanced age (>62men, >59 women);dyslipidemia;hypertension;male gender;smoking/ tobacco exposure (been 3wks since stopped smoking)     Objective:     Today's Vitals   07/30/23 1448  Weight: 196 lb (88.9 kg)  Height: 6\' 1"  (1.854 m)   Body mass index is 25.86 kg/m.     07/30/2023    2:47 PM 07/25/2022    3:03 PM 04/28/2022    9:01 PM 09/24/2021    3:40 PM 12/13/2019   12:19 PM 09/04/2019    7:25 AM 04/12/2019    6:12 PM  Advanced Directives  Does Patient Have a Medical Advance Directive? No Yes No Yes No No No  Type of Special educational needs teacher of [place];Living will  Healthcare Power of [place];Living will     Copy of Healthcare Power of  Attorney in Chart?  No - copy requested  No - copy requested     Would patient like information on creating a medical advance directive? No - Patient declined    Yes (ED - Information included in AVS)  No - Patient declined    Current Medications (verified) Outpatient Encounter Medications as of 07/30/2023  Medication Sig   amLODipine  (NORVASC ) 10 MG tablet Take 1 tablet (10 mg total) by mouth daily.   aspirin  81 MG tablet Take 1 tablet (81 mg total) by mouth daily.   brimonidine (ALPHAGAN) 0.2 % ophthalmic solution Place 1 drop into both eyes 2 (two) times daily.    dorzolamide-timolol (COSOPT) 22.3-6.8 MG/ML ophthalmic solution Place 1 drop into both eyes 2 (two) times daily.    latanoprost (XALATAN) 0.005 % ophthalmic solution Place 1 drop into both eyes at bedtime.   metoprolol  succinate (TOPROL -XL) 100 MG 24 hr tablet Take 1 tablet (100 mg total) by mouth daily.   Omega-3 Fatty Acids (FISH OIL PO) Take by mouth as needed.   sildenafil  (VIAGRA ) 50 MG tablet TAKE 1 TABLET BY MOUTH ONCE DAILY AS NEEDED FOR ERECTILE DYSFUNCTION   simvastatin  (ZOCOR ) 20 MG tablet TAKE 1 TABLET BY MOUTH AT BEDTIME   spironolactone  (ALDACTONE ) 25 MG tablet Take 1 tablet (25 mg total) by mouth daily.   No facility-administered encounter medications on file as of 07/30/2023.    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   GERD (gastroesophageal reflux disease)    Glaucoma    Hematuria    HLD (hyperlipidemia)    HTN (hypertension)    Perinephric hematoma    Past Surgical History:  Procedure Laterality Date   colonscopy     EYE SURGERY Right 1993   LASIK     Family History  Problem Relation Age of Onset   Diabetes Father    Social  History   Socioeconomic History   Marital status: Legally Separated    Spouse name: Not on file   Number of children: 1   Years of education: Not on file   Highest education level: Not on file  Occupational History   Occupation: Receiving Marine scientist: [employer]  Tobacco Use   Smoking status: Former    Current packs/day: 0.00    Average packs/day: 1 pack/day for 54.3 years (54.3 ttl pk-yrs)    Types: Cigarettes    Start date: 03/04/1969    Quit date: 07/07/2023    Years since quitting: 0.0    Passive exposure: Current   Smokeless tobacco: Never  Substance and Sexual Activity   Alcohol use: Yes    Alcohol/week: 10.0 standard drinks of alcohol    Types: 10 Cans of beer per week    Comment: occas   Drug use: No   Sexual activity: Yes    Partners: Female  Other Topics Concern   Not on file  Social History Narrative   Lives with his wife.   Social Drivers of Health   Financial Resource Strain: Medium Risk (07/30/2023)   Overall Financial Resource Strain (CARDIA)    Difficulty of Paying Living Expenses: Somewhat hard  Food Insecurity: Food Insecurity Present (07/30/2023)   Hunger Vital Sign    Worried About Running Out of Food in the Last Year: Sometimes true    Ran Out of Food in the Last Year: Never true  Transportation Needs: No Transportation Needs (07/30/2023)   PRAPARE - Administrator, Civil Service (Medical): No    Lack of Transportation (Non-Medical): No  Physical Activity: Sufficiently Active (07/30/2023)   Exercise Vital Sign    Days of Exercise per Week: 4 days    Minutes of Exercise per Session: 80 min  Stress: No Stress Concern Present (07/30/2023)   [employer] of Occupational Health - Occupational Stress Questionnaire    Feeling of Stress : Not at all  Social Connections: Socially Isolated (07/30/2023)   Social Connection and Isolation Panel [NHANES]    Frequency of Communication with Friends and Family: More than three times a week     Frequency of Social Gatherings with Friends and Family: Three times a week    Attends Religious Services: Never    Active Member of Clubs or Organizations: No    Attends Banker Meetings: Never    Marital Status: Separated    Tobacco Counseling Counseling given: Yes    Clinical Intake:  Pre-visit preparation completed: Yes  Pain : No/denies pain     BMI - recorded: 25.86 Nutritional Status: BMI 25 -29 Overweight Nutritional Risks: None Diabetes: No  Lab Results  Component Value Date   HGBA1C 5.8 11/12/2022   HGBA1C 6.1 05/08/2022   HGBA1C 5.9 11/13/2021     How often do you need to have someone help you when you read instructions, pamphlets, or  other written materials from your doctor or pharmacy?: 1 - Never  Interpreter Needed?: No  Information entered by :: Kandy Orris, CMA   Activities of Daily Living     07/30/2023    2:50 PM  In your present state of health, do you have any difficulty performing the following activities:  Hearing? 0  Vision? 0  Difficulty concentrating or making decisions? 0  Walking or climbing stairs? 0  Dressing or bathing? 0  Doing errands, shopping? 0  Preparing Food and eating ? N  Using the Toilet? N  In the past six months, have you accidently leaked urine? N  Do you have problems with loss of bowel control? N  Managing your Medications? N  Managing your Finances? N  Housekeeping or managing your Housekeeping? N    Patient Care Team: Arcadio Knuckles, MD as PCP - General (Internal Medicine) Arleen Lacer, MD as PCP - Cardiology (Cardiology) Nelva Bang, OD as Referring Physician (Optometry)  Indicate any recent Medical Services you may have received from other than [facility] providers in the past year (date may be approximate).     Assessment:    This is a routine wellness examination for Nathan Stafford.  Hearing/Vision screen Hearing Screening - Comments:: Denies hearing difficulties   Vision Screening -  Comments:: Denies vision concerns   Goals Addressed               This Visit's Progress     Patient Stated (pt-stated)        Patient stated he plans to try to eat healthy       Depression Screen     07/30/2023    2:53 PM 11/12/2022    8:57 AM 07/25/2022    3:07 PM 05/08/2022    3:54 PM 09/24/2021    3:44 PM 09/24/2021    3:39 PM 05/11/2020    9:38 AM  PHQ 2/9 Scores  PHQ - 2 Score 0 0 0 0 0 0 0  PHQ- 9 Score   0        Fall Risk     07/30/2023    2:55 PM 11/21/2022   10:09 AM 11/12/2022    8:57 AM 07/25/2022    3:06 PM 05/08/2022    3:53 PM  Fall Risk   Falls in the past year? 0 1 0 1 1  Number falls in past yr: 0 0 0 1 0  Injury with Fall? 0 0 0 1 0  Risk for fall due to : No Fall Risks  No Fall Risks History of fall(s);Impaired balance/gait;Orthopedic patient Other (Comment)  Follow up Falls evaluation completed;Falls prevention discussed  Falls evaluation completed Education provided;Falls prevention discussed Falls evaluation completed    MEDICARE RISK AT HOME:  Medicare Risk at Home Any stairs in or around the home?: No If so, are there any without handrails?: No Home free of loose throw rugs in walkways, pet beds, electrical cords, etc?: Yes Adequate lighting in your home to reduce risk of falls?: Yes Life alert?: No Use of a cane, walker or w/c?: No Grab bars in the bathroom?: Yes Shower chair or bench in shower?: Yes Elevated toilet seat or a handicapped toilet?: Yes  TIMED UP AND GO:  Was the test performed?  No  Cognitive Function: 6CIT completed        07/30/2023    2:56 PM 07/25/2022    3:13 PM 12/13/2019   12:11 PM 08/03/2018    9:06 AM  6CIT Screen  What Year? 0 points 0 points 0 points 0 points  What month? 3 points 0 points 0 points 0 points  What time? 0 points 0 points 0 points 0 points  Count back from 20 0 points 0 points 0 points 0 points  Months in reverse 4 points 0 points 0 points 0 points  Repeat phrase 0 points 0 points 0 points 0  points  Total Score 7 points 0 points 0 points 0 points    Immunizations Immunization History  Administered Date(s) Administered   Influenza-Unspecified 12/07/2019   PFIZER(Purple Top)SARS-COV-2 Vaccination 05/28/2019, 06/18/2019   Pneumococcal Conjugate-13 04/26/2015    Screening Tests Health Maintenance  Topic Date Due   DTaP/Tdap/Td (1 - Tdap) Never done   Lung Cancer Screening  Never done   Zoster Vaccines- Shingrix  (1 of 2) Never done   Pneumonia Vaccine 31+ Years old (2 of 2 - PPSV23) 06/21/2015   INFLUENZA VACCINE  10/03/2023   Medicare Annual Wellness (AWV)  07/29/2024   Colonoscopy  09/21/2026   Hepatitis C Screening  Completed   HPV VACCINES  Aged Out   Meningococcal B Vaccine  Aged Out   COVID-19 Vaccine  Discontinued    Health Maintenance  Health Maintenance Due  Topic Date Due   DTaP/Tdap/Td (1 - Tdap) Never done   Lung Cancer Screening  Never done   Zoster Vaccines- Shingrix  (1 of 2) Never done   Pneumonia Vaccine 21+ Years old (2 of 2 - PPSV23) 06/21/2015   Health Maintenance Items Addressed:  Lung Cancer Screening ordered today  Additional Screening:  Vision Screening: Recommended annual ophthalmology exams for early detection of glaucoma and other disorders of the eye.  Dental Screening: Recommended annual dental exams for proper oral hygiene  Community Resource Referral / Chronic Care Management: CRR required this visit?  No   CCM required this visit?  No   Plan:    I have personally reviewed and noted the following in the patient's chart:   Medical and social history Use of alcohol, tobacco or illicit drugs  Current medications and supplements including opioid prescriptions. Patient is not currently taking opioid prescriptions. Functional ability and status Nutritional status Physical activity Advanced directives List of other physicians Hospitalizations, surgeries, and ER visits in previous 12 months Vitals Screenings to include  cognitive, depression, and falls Referrals and appointments  In addition, I have reviewed and discussed with patient certain preventive protocols, quality metrics, and best practice recommendations. A written personalized care plan for preventive services as well as general preventive health recommendations were provided to patient.   Patria Bookbinder, CMA   07/30/2023   After Visit Summary: (Mail) Due to this being a telephonic visit, the after visit summary with patients personalized plan was offered to patient via mail   Notes: Nothing significant to report at this time.

## 2023-07-30 NOTE — Patient Instructions (Addendum)
 Nathan Stafford , Thank you for taking time out of your busy schedule to complete your Annual Wellness Visit with me. I enjoyed our conversation and look forward to speaking with you again next year. I, as well as your care team,  appreciate your ongoing commitment to your health goals. Please review the following plan we discussed and let me know if I can assist you in the future. Your Game plan/ To Do List    Referrals: If you haven't heard from the office you've been referred to, please reach out to them at the phone provided.  Referral to [facility] for a Lung Cancer Screening test.   Follow up Visits: Next Medicare AWV with our clinical staff: 08/02/2024   Have you seen your provider in the last 6 months (3 months if uncontrolled diabetes)? No Next Office Visit with your provider: 08/21/2023  Clinician Recommendations:  Aim for 30 minutes of exercise or brisk walking, 6-8 glasses of water, and 5 servings of fruits and vegetables each day. Educated and advised on getting the COVID, Pneumonia, Tdap (Tetenus), and Shingles vaccines in 2025.      This is a list of the screening recommended for you and due dates:  Health Maintenance  Topic Date Due   DTaP/Tdap/Td vaccine (1 - Tdap) Never done   Screening for Lung Cancer  Never done   Zoster (Shingles) Vaccine (1 of 2) Never done   Pneumonia Vaccine (2 of 2 - PPSV23) 06/21/2015   Flu Shot  10/03/2023   Medicare Annual Wellness Visit  07/29/2024   Colon Cancer Screening  09/21/2026   Hepatitis C Screening  Completed   HPV Vaccine  Aged Out   Meningitis B Vaccine  Aged Out   COVID-19 Vaccine  Discontinued    Advanced directives: (Declined) Advance directive discussed with you today. Even though you declined this today, please call our office should you change your mind, and we can give you the proper paperwork for you to fill out. Advance Care Planning is important because it:  [x]  Makes sure you receive the medical care that is  consistent with your values, goals, and preferences  [x]  It provides guidance to your family and loved ones and reduces their decisional burden about whether or not they are making the right decisions based on your wishes.  Follow the link provided in your after visit summary or read over the paperwork we have mailed to you to help you started getting your Advance Directives in place. If you need assistance in completing these, please reach out to us  so that we can help you!

## 2023-08-06 ENCOUNTER — Other Ambulatory Visit: Payer: Self-pay | Admitting: Internal Medicine

## 2023-08-06 DIAGNOSIS — E785 Hyperlipidemia, unspecified: Secondary | ICD-10-CM

## 2023-08-21 ENCOUNTER — Ambulatory Visit: Payer: Self-pay | Admitting: Internal Medicine

## 2023-08-21 ENCOUNTER — Encounter: Payer: Self-pay | Admitting: Internal Medicine

## 2023-08-21 ENCOUNTER — Ambulatory Visit: Admitting: Internal Medicine

## 2023-08-21 VITALS — BP 162/82 | HR 62 | Temp 98.3°F | Ht 73.0 in | Wt 182.6 lb

## 2023-08-21 DIAGNOSIS — T502X5A Adverse effect of carbonic-anhydrase inhibitors, benzothiadiazides and other diuretics, initial encounter: Secondary | ICD-10-CM

## 2023-08-21 DIAGNOSIS — I110 Hypertensive heart disease with heart failure: Secondary | ICD-10-CM

## 2023-08-21 DIAGNOSIS — E785 Hyperlipidemia, unspecified: Secondary | ICD-10-CM

## 2023-08-21 DIAGNOSIS — K7010 Alcoholic hepatitis without ascites: Secondary | ICD-10-CM | POA: Diagnosis not present

## 2023-08-21 DIAGNOSIS — I429 Cardiomyopathy, unspecified: Secondary | ICD-10-CM | POA: Diagnosis not present

## 2023-08-21 DIAGNOSIS — R9431 Abnormal electrocardiogram [ECG] [EKG]: Secondary | ICD-10-CM | POA: Diagnosis not present

## 2023-08-21 DIAGNOSIS — I10 Essential (primary) hypertension: Secondary | ICD-10-CM

## 2023-08-21 DIAGNOSIS — R7303 Prediabetes: Secondary | ICD-10-CM

## 2023-08-21 DIAGNOSIS — E876 Hypokalemia: Secondary | ICD-10-CM | POA: Diagnosis not present

## 2023-08-21 DIAGNOSIS — I5042 Chronic combined systolic (congestive) and diastolic (congestive) heart failure: Secondary | ICD-10-CM

## 2023-08-21 LAB — HEPATIC FUNCTION PANEL
ALT: 66 U/L — ABNORMAL HIGH (ref 0–53)
AST: 72 U/L — ABNORMAL HIGH (ref 0–37)
Albumin: 4 g/dL (ref 3.5–5.2)
Alkaline Phosphatase: 63 U/L (ref 39–117)
Bilirubin, Direct: 0.1 mg/dL (ref 0.0–0.3)
Total Bilirubin: 0.5 mg/dL (ref 0.2–1.2)
Total Protein: 7.3 g/dL (ref 6.0–8.3)

## 2023-08-21 LAB — URINALYSIS, ROUTINE W REFLEX MICROSCOPIC
Bilirubin Urine: NEGATIVE
Hgb urine dipstick: NEGATIVE
Ketones, ur: NEGATIVE
Leukocytes,Ua: NEGATIVE
Nitrite: NEGATIVE
Specific Gravity, Urine: 1.025 (ref 1.000–1.030)
Total Protein, Urine: >=300 — AB
Urine Glucose: NEGATIVE
Urobilinogen, UA: 4 — AB (ref 0.0–1.0)
pH: 6 (ref 5.0–8.0)

## 2023-08-21 LAB — CBC WITH DIFFERENTIAL/PLATELET
Basophils Absolute: 0 10*3/uL (ref 0.0–0.1)
Basophils Relative: 0.8 % (ref 0.0–3.0)
Eosinophils Absolute: 0 10*3/uL (ref 0.0–0.7)
Eosinophils Relative: 1 % (ref 0.0–5.0)
HCT: 43.7 % (ref 39.0–52.0)
Hemoglobin: 14.5 g/dL (ref 13.0–17.0)
Lymphocytes Relative: 32.7 % (ref 12.0–46.0)
Lymphs Abs: 1.7 10*3/uL (ref 0.7–4.0)
MCHC: 33.2 g/dL (ref 30.0–36.0)
MCV: 89 fl (ref 78.0–100.0)
Monocytes Absolute: 0.6 10*3/uL (ref 0.1–1.0)
Monocytes Relative: 11.7 % (ref 3.0–12.0)
Neutro Abs: 2.7 10*3/uL (ref 1.4–7.7)
Neutrophils Relative %: 53.8 % (ref 43.0–77.0)
Platelets: 172 10*3/uL (ref 150.0–400.0)
RBC: 4.91 Mil/uL (ref 4.22–5.81)
RDW: 15.7 % — ABNORMAL HIGH (ref 11.5–15.5)
WBC: 5.1 10*3/uL (ref 4.0–10.5)

## 2023-08-21 LAB — BASIC METABOLIC PANEL WITH GFR
BUN: 8 mg/dL (ref 6–23)
CO2: 29 meq/L (ref 19–32)
Calcium: 9.3 mg/dL (ref 8.4–10.5)
Chloride: 104 meq/L (ref 96–112)
Creatinine, Ser: 0.88 mg/dL (ref 0.40–1.50)
GFR: 84.89 mL/min (ref >60.00)
Glucose, Bld: 101 mg/dL — ABNORMAL HIGH (ref 70–99)
Potassium: 3.8 meq/L (ref 3.5–5.1)
Sodium: 142 meq/L (ref 135–145)

## 2023-08-21 LAB — HEMOGLOBIN A1C: Hgb A1c MFr Bld: 5.6 % (ref 4.6–6.5)

## 2023-08-21 LAB — TSH: TSH: 1.05 u[IU]/mL (ref 0.35–5.50)

## 2023-08-21 LAB — TROPONIN I (HIGH SENSITIVITY): High Sens Troponin I: 14 ng/L (ref 2–17)

## 2023-08-21 LAB — BRAIN NATRIURETIC PEPTIDE: Pro B Natriuretic peptide (BNP): 53 pg/mL (ref 0.0–100.0)

## 2023-08-21 MED ORDER — AMLODIPINE BESYLATE 10 MG PO TABS: 10.0000 mg | ORAL_TABLET | Freq: Every day | ORAL | 0 refills | Status: DC

## 2023-08-21 MED ORDER — SIMVASTATIN 20 MG PO TABS: 20.0000 mg | ORAL_TABLET | Freq: Every day | ORAL | 0 refills | Status: DC

## 2023-08-21 MED ORDER — SPIRONOLACTONE 25 MG PO TABS: 25.0000 mg | ORAL_TABLET | Freq: Every day | ORAL | 0 refills | Status: AC

## 2023-08-21 NOTE — Patient Instructions (Signed)
 Hypertension, Adult High blood pressure (hypertension) is when the force of blood pumping through the arteries is too strong. The arteries are the blood vessels that carry blood from the heart throughout the body. Hypertension forces the heart to work harder to pump blood and may cause arteries to become narrow or stiff. Untreated or uncontrolled hypertension can lead to a heart attack, heart failure, a stroke, kidney disease, and other problems. A blood pressure reading consists of a higher number over a lower number. Ideally, your blood pressure should be below 120/80. The first ("top") number is called the systolic pressure. It is a measure of the pressure in your arteries as your heart beats. The second ("bottom") number is called the diastolic pressure. It is a measure of the pressure in your arteries as the heart relaxes. What are the causes? The exact cause of this condition is not known. There are some conditions that result in high blood pressure. What increases the risk? Certain factors may make you more likely to develop high blood pressure. Some of these risk factors are under your control, including: Smoking. Not getting enough exercise or physical activity. Being overweight. Having too much fat, sugar, calories, or salt (sodium) in your diet. Drinking too much alcohol. Other risk factors include: Having a personal history of heart disease, diabetes, high cholesterol, or kidney disease. Stress. Having a family history of high blood pressure and high cholesterol. Having obstructive sleep apnea. Age. The risk increases with age. What are the signs or symptoms? High blood pressure may not cause symptoms. Very high blood pressure (hypertensive crisis) may cause: Headache. Fast or irregular heartbeats (palpitations). Shortness of breath. Nosebleed. Nausea and vomiting. Vision changes. Severe chest pain, dizziness, and seizures. How is this diagnosed? This condition is diagnosed by  measuring your blood pressure while you are seated, with your arm resting on a flat surface, your legs uncrossed, and your feet flat on the floor. The cuff of the blood pressure monitor will be placed directly against the skin of your upper arm at the level of your heart. Blood pressure should be measured at least twice using the same arm. Certain conditions can cause a difference in blood pressure between your right and left arms. If you have a high blood pressure reading during one visit or you have normal blood pressure with other risk factors, you may be asked to: Return on a different day to have your blood pressure checked again. Monitor your blood pressure at home for 1 week or longer. If you are diagnosed with hypertension, you may have other blood or imaging tests to help your health care provider understand your overall risk for other conditions. How is this treated? This condition is treated by making healthy lifestyle changes, such as eating healthy foods, exercising more, and reducing your alcohol intake. You may be referred for counseling on a healthy diet and physical activity. Your health care provider may prescribe medicine if lifestyle changes are not enough to get your blood pressure under control and if: Your systolic blood pressure is above 130. Your diastolic blood pressure is above 80. Your personal target blood pressure may vary depending on your medical conditions, your age, and other factors. Follow these instructions at home: Eating and drinking  Eat a diet that is high in fiber and potassium, and low in sodium, added sugar, and fat. An example of this eating plan is called the DASH diet. DASH stands for Dietary Approaches to Stop Hypertension. To eat this way: Eat  plenty of fresh fruits and vegetables. Try to fill one half of your plate at each meal with fruits and vegetables. Eat whole grains, such as whole-wheat pasta, brown rice, or whole-grain bread. Fill about one  fourth of your plate with whole grains. Eat or drink low-fat dairy products, such as skim milk or low-fat yogurt. Avoid fatty cuts of meat, processed or cured meats, and poultry with skin. Fill about one fourth of your plate with lean proteins, such as fish, chicken without skin, beans, eggs, or tofu. Avoid pre-made and processed foods. These tend to be higher in sodium, added sugar, and fat. Reduce your daily sodium intake. Many people with hypertension should eat less than 1,500 mg of sodium a day. Do not drink alcohol if: Your health care provider tells you not to drink. You are pregnant, may be pregnant, or are planning to become pregnant. If you drink alcohol: Limit how much you have to: 0-1 drink a day for women. 0-2 drinks a day for men. Know how much alcohol is in your drink. In the [place] one drink equals one 12 oz bottle of beer (355 mL), one 5 oz glass of wine (148 mL), or one 1 oz glass of hard liquor (44 mL). Lifestyle  Work with your health care provider to maintain a healthy body weight or to lose weight. Ask what an ideal weight is for you. Get at least 30 minutes of exercise that causes your heart to beat faster (aerobic exercise) most days of the week. Activities may include walking, swimming, or biking. Include exercise to strengthen your muscles (resistance exercise), such as Pilates or lifting weights, as part of your weekly exercise routine. Try to do these types of exercises for 30 minutes at least 3 days a week. Do not use any products that contain nicotine or tobacco. These products include cigarettes, chewing tobacco, and vaping devices, such as e-cigarettes. If you need help quitting, ask your health care provider. Monitor your blood pressure at home as told by your health care provider. Keep all follow-up visits. This is important. Medicines Take over-the-counter and prescription medicines only as told by your health care provider. Follow directions carefully. Blood  pressure medicines must be taken as prescribed. Do not skip doses of blood pressure medicine. Doing this puts you at risk for problems and can make the medicine less effective. Ask your health care provider about side effects or reactions to medicines that you should watch for. Contact a health care provider if you: Think you are having a reaction to a medicine you are taking. Have headaches that keep coming back (recurring). Feel dizzy. Have swelling in your ankles. Have trouble with your vision. Get help right away if you: Develop a severe headache or confusion. Have unusual weakness or numbness. Feel faint. Have severe pain in your chest or abdomen. Vomit repeatedly. Have trouble breathing. These symptoms may be an emergency. Get help right away. Call 911. Do not wait to see if the symptoms will go away. Do not drive yourself to the hospital. Summary Hypertension is when the force of blood pumping through your arteries is too strong. If this condition is not controlled, it may put you at risk for serious complications. Your personal target blood pressure may vary depending on your medical conditions, your age, and other factors. For most people, a normal blood pressure is less than 120/80. Hypertension is treated with lifestyle changes, medicines, or a combination of both. Lifestyle changes include losing weight, eating a healthy,  low-sodium diet, exercising more, and limiting alcohol. This information is not intended to replace advice given to you by your health care provider. Make sure you discuss any questions you have with your health care provider. Document Revised: 12/26/2020 Document Reviewed: 12/26/2020 Elsevier Patient Education  2024 [employer].

## 2023-08-21 NOTE — Progress Notes (Signed)
 Subjective:  Patient ID: Nathan Stafford, male    DOB: 02-06-1951  Age: 73 y.o. MRN: [id number]  CC: Hypertension and Congestive Heart Failure   HPI Nathan Stafford presents for f/up ----  Discussed the use of AI scribe software for clinical note transcription with the patient, who gave verbal consent to proceed.  History of Present Illness   Nathan Stafford is a 73 year old male who presents for a follow-up after multiple falls.  He has experienced several falls over the years at his apartment complex. During a recent fall, someone was present and called 911. EMS informed him that his heart rate was abnormal at the time, but he declined to go to the emergency room. He needs assistance at home, particularly to help him get up after falls, and requests a letter to inform his landlord that Nathan Stafford is staying with him for this purpose.  No headaches, blurred vision, chest pain, shortness of breath, dizziness, lightheadedness, coughing, trouble breathing, changes in weight or appetite, or issues with bowel movements.  He takes blood pressure medication, which he attempted to take on the morning of the visit, but does not report any new or different medications.  He has previously received vaccinations for shingles and tetanus.  He quit smoking and drinks alcohol infrequently.       Outpatient Medications Prior to Visit  Medication Sig Dispense Refill   aspirin  81 MG tablet Take 1 tablet (81 mg total) by mouth daily. 90 tablet 3   brimonidine (ALPHAGAN) 0.2 % ophthalmic solution Place 1 drop into both eyes 2 (two) times daily.      dorzolamide-timolol (COSOPT) 22.3-6.8 MG/ML ophthalmic solution Place 1 drop into both eyes 2 (two) times daily.      latanoprost (XALATAN) 0.005 % ophthalmic solution Place 1 drop into both eyes at bedtime.     metoprolol  succinate (TOPROL -XL) 100 MG 24 hr tablet Take 1 tablet (100 mg total) by mouth daily. 90 tablet 2   Omega-3 Fatty Acids (FISH OIL PO) Take by  mouth as needed.     sildenafil  (VIAGRA ) 50 MG tablet TAKE 1 TABLET BY MOUTH ONCE DAILY AS NEEDED FOR ERECTILE DYSFUNCTION 30 tablet 0   amLODipine  (NORVASC ) 10 MG tablet Take 1 tablet (10 mg total) by mouth daily. 90 tablet 0   simvastatin  (ZOCOR ) 20 MG tablet TAKE 1 TABLET BY MOUTH AT BEDTIME . PLEASE SCHEDULE AN APPOINTMENT WITH DR. Rochelle Chu FOR FUTHER REFILLS 30 tablet 0   spironolactone  (ALDACTONE ) 25 MG tablet Take 1 tablet (25 mg total) by mouth daily. 90 tablet 1   No facility-administered medications prior to visit.    ROS Review of Systems  Constitutional:  Positive for unexpected weight change (wt loss). Negative for appetite change, chills, diaphoresis and fatigue.  HENT: Negative.    Eyes: Negative.   Respiratory:  Negative for cough, chest tightness, shortness of breath and wheezing.   Cardiovascular:  Negative for chest pain, palpitations and leg swelling.  Gastrointestinal: Negative.  Negative for abdominal pain, constipation, diarrhea, nausea and vomiting.  Endocrine: Negative.   Genitourinary: Negative.  Negative for difficulty urinating.  Musculoskeletal: Negative.   Skin: Negative.   Neurological:  Negative for dizziness, weakness and light-headedness.  Hematological:  Negative for adenopathy. Does not bruise/bleed easily.  Psychiatric/Behavioral:  Positive for confusion and decreased concentration.     Objective:  BP (!) 162/82 (BP Location: Left Arm, Patient Position: Sitting, Cuff Size: Normal)   Pulse 62   Temp 98.3  F (36.8 C) (Oral)   Ht 6' 1 (1.854 m)   Wt 182 lb 9.6 oz (82.8 kg)   SpO2 98%   BMI 24.09 kg/m   BP Readings from Last 3 Encounters:  08/21/23 (!) 162/82  11/12/22 (!) 154/76  08/15/22 (!) 140/66    Wt Readings from Last 3 Encounters:  08/21/23 182 lb 9.6 oz (82.8 kg)  07/30/23 196 lb (88.9 kg)  11/12/22 196 lb (88.9 kg)    Physical Exam Vitals reviewed.  Constitutional:      General: He is not in acute distress.    Appearance:  He is ill-appearing. He is not toxic-appearing or diaphoretic.  HENT:     Nose: Nose normal.     Mouth/Throat:     Mouth: Mucous membranes are moist.   Eyes:     General: No scleral icterus.    Conjunctiva/sclera: Conjunctivae normal.    Cardiovascular:     Rate and Rhythm: Normal rate and regular rhythm.     Heart sounds: No murmur heard.    No friction rub. No gallop.     Comments: EKG --  NSR, 66 bpm RBBB LAFB TWI in inferior/lateral leads is new  Pulmonary:     Effort: Pulmonary effort is normal.     Breath sounds: No stridor. No wheezing, rhonchi or rales.  Abdominal:     General: Abdomen is flat.     Palpations: There is no mass.     Tenderness: There is no abdominal tenderness. There is no guarding.     Hernia: No hernia is present.   Musculoskeletal:        General: Normal range of motion.     Cervical back: Neck supple.     Right lower leg: No edema.     Left lower leg: No edema.  Lymphadenopathy:     Cervical: No cervical adenopathy.   Skin:    General: Skin is warm and dry.     Findings: No rash.   Neurological:     General: No focal deficit present.     Mental Status: He is alert.   Psychiatric:        Mood and Affect: Mood normal.        Behavior: Behavior normal.     Lab Results  Component Value Date   WBC 5.1 08/21/2023   HGB 14.5 08/21/2023   HCT 43.7 08/21/2023   PLT 172.0 08/21/2023   GLUCOSE 101 (H) 08/21/2023   CHOL 169 11/12/2022   TRIG 65.0 11/12/2022   HDL 55.50 11/12/2022   LDLCALC 101 (H) 11/12/2022   ALT 66 (H) 08/21/2023   AST 72 (H) 08/21/2023   NA 142 08/21/2023   K 3.8 08/21/2023   CL 104 08/21/2023   CREATININE 0.88 08/21/2023   BUN 8 08/21/2023   CO2 29 08/21/2023   TSH 1.05 08/21/2023   PSA 1.71 11/12/2022   HGBA1C 5.6 08/21/2023    DG Chest Portable 1 View Result Date: 04/28/2022 CLINICAL DATA:  Near syncope after combination of alcohol, cocaine, and Viagra . Now feeling fatigued. EXAM: PORTABLE CHEST 1  VIEW COMPARISON:  None Available. FINDINGS: Lung volumes are low. Heart is prominent in size, however likely accentuated by low volumes and AP technique. Bronchovascular crowding versus vascular congestion. No focal airspace disease. Mild biapical pleuroparenchymal scarring. No pleural effusion or pneumothorax. No acute osseous abnormalities IMPRESSION: 1. Low lung volumes with bronchovascular crowding versus vascular congestion. 2. Prominent heart size, likely accentuated by low volumes and AP  technique. Electronically Signed   By: Chadwick Colonel M.D.   On: 04/28/2022 21:44    Assessment & Plan:  Primary hypertension- He has not achieved his BP goal. Will restart the MRA and CCB. -     TSH; Future -     Urinalysis, Routine w reflex microscopic; Future -     Basic metabolic panel with GFR; Future -     CBC with Differential/Platelet; Future -     EKG 12-Lead -     AMB Referral VBCI Care Management -     amLODIPine  Besylate; Take 1 tablet (10 mg total) by mouth daily.  Dispense: 90 tablet; Refill: 0 -     Spironolactone ; Take 1 tablet (25 mg total) by mouth daily.  Dispense: 90 tablet; Refill: 0  Cardiomyopathy, unspecified type (HCC) -     Troponin I (High Sensitivity); Future -     Brain natriuretic peptide; Future -     Ambulatory referral to Cardiology -     AMB Referral VBCI Care Management -     amLODIPine  Besylate; Take 1 tablet (10 mg total) by mouth daily.  Dispense: 90 tablet; Refill: 0 -     Spironolactone ; Take 1 tablet (25 mg total) by mouth daily.  Dispense: 90 tablet; Refill: 0 -     ECHOCARDIOGRAM COMPLETE; Future  Prediabetes -     Hemoglobin A1c; Future  Dyslipidemia, goal LDL below 70 -     TSH; Future -     Hepatic function panel; Future -     Simvastatin ; Take 1 tablet (20 mg total) by mouth daily at 6 PM.  Dispense: 90 tablet; Refill: 0  Alcoholic hepatitis without ascites -     AMB Referral VBCI Care Management  Hypertensive heart disease with chronic  combined systolic and diastolic congestive heart failure (HCC) -     amLODIPine  Besylate; Take 1 tablet (10 mg total) by mouth daily.  Dispense: 90 tablet; Refill: 0  Diuretic-induced hypokalemia -     Spironolactone ; Take 1 tablet (25 mg total) by mouth daily.  Dispense: 90 tablet; Refill: 0  Abnormal electrocardiogram (ECG) (EKG) -     ECHOCARDIOGRAM COMPLETE; Future     Follow-up: Return if symptoms worsen or fail to improve.  Sandra Crouch, MD

## 2023-08-24 ENCOUNTER — Other Ambulatory Visit: Payer: Self-pay | Admitting: Internal Medicine

## 2023-08-24 DIAGNOSIS — K7010 Alcoholic hepatitis without ascites: Secondary | ICD-10-CM

## 2023-08-24 MED ORDER — VITAMIN B-1 50 MG PO TABS: 50.0000 mg | ORAL_TABLET | Freq: Every day | ORAL | 1 refills | Status: AC

## 2023-09-01 ENCOUNTER — Telehealth: Payer: Self-pay | Admitting: *Deleted

## 2023-09-01 NOTE — Progress Notes (Unsigned)
 Complex Care Management Note Care Guide Note  09/01/2023 Name: Nathan Stafford MRN: [id number] DOB: 10/22/1950   Complex Care Management Outreach Attempts: An unsuccessful telephone outreach was attempted today to offer the patient information about available complex care management services.  Follow Up Plan:  Additional outreach attempts will be made to offer the patient complex care management information and services.   Encounter Outcome:  No Answer  [name], CMA   [facility], [facility] Guide Direct Dial: [phone]  Fax: [phone] Website: [email or web address]

## 2023-09-02 NOTE — Progress Notes (Signed)
 Complex Care Management Note Care Guide Note  09/02/2023 Name: Nathan Stafford MRN: [id number] DOB: 1950-10-23   Complex Care Management Outreach Attempts: A second unsuccessful outreach was attempted today to offer the patient with information about available complex care management services.  Follow Up Plan:  Additional outreach attempts will be made to offer the patient complex care management information and services.   Encounter Outcome:  No Answer  [name], CMA [place]  [facility], [facility] Guide Direct Dial: [phone]  Fax: [phone] Website: [email or web address]

## 2023-09-02 NOTE — Progress Notes (Signed)
 Complex Care Management Note  Care Guide Note 09/02/2023 Name: Nathan Stafford MRN: [id number] DOB: 05-17-1950  Nathan Stafford is a 73 y.o. year old male who sees Joshua Debby CROME, MD for primary care. I reached out to Linzy Dandy by phone today to offer complex care management services.  [name] was given information about Complex Care Management services today including:   The Complex Care Management services include support from the care team which includes your Nurse Care Manager, Clinical Social Worker, or Pharmacist.  The Complex Care Management team is here to help remove barriers to the health concerns and goals most important to you. Complex Care Management services are voluntary, and the patient may decline or stop services at any time by request to their care team member.   Complex Care Management Consent Status: Patient agreed to services and verbal consent obtained.   Follow up plan:  Telephone appointment with complex care management team member scheduled for:  09/15/2023 and 09/16/2023  Encounter Outcome:  Patient Scheduled  [name], CMA [place]  [facility], [facility] Guide Direct Dial: [phone]  Fax: [phone] Website: [email or web address]

## 2023-09-04 ENCOUNTER — Ambulatory Visit (HOSPITAL_COMMUNITY): Admission: RE | Admit: 2023-09-04 | Source: Ambulatory Visit

## 2023-09-08 ENCOUNTER — Telehealth (HOSPITAL_COMMUNITY): Payer: Self-pay | Admitting: Internal Medicine
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
## 2023-09-12 ENCOUNTER — Other Ambulatory Visit: Payer: Self-pay | Admitting: Internal Medicine

## 2023-09-15 ENCOUNTER — Telehealth: Payer: Self-pay | Admitting: Pharmacist

## 2023-09-15 ENCOUNTER — Other Ambulatory Visit

## 2023-09-15 NOTE — Telephone Encounter (Signed)
 Called patient for 11 AM appt this morning. Someone else answered and stated pt had walked away, noted to call back this afternoon. Called 3:15 PM and 3:50 PM, no answer, received voicemail box but it is full, unable to leave message.  Darrelyn Drum, PharmD, BCPS, CPP Clinical Pharmacist Practitioner [facility] [phone]

## 2023-09-16 ENCOUNTER — Other Ambulatory Visit: Payer: Self-pay | Admitting: *Deleted

## 2023-09-16 DIAGNOSIS — H401121 Primary open-angle glaucoma, left eye, mild stage: Secondary | ICD-10-CM | POA: Diagnosis not present

## 2023-09-16 DIAGNOSIS — H401113 Primary open-angle glaucoma, right eye, severe stage: Secondary | ICD-10-CM | POA: Diagnosis not present

## 2023-09-16 NOTE — Patient Outreach (Signed)
 Phone call to patient to complete initial intake. Patient was not able to complete assessment today, stating that he was on the way to a eye doctor appointment. Patient requested that the appointment be re-scheduled.  Kaisyn Millea, LCSW [place]  [facility], [facility] Licensed Clinical Social Worker  Direct Dial: [phone]

## 2023-09-18 ENCOUNTER — Telehealth: Payer: Self-pay | Admitting: *Deleted

## 2023-09-18 NOTE — Progress Notes (Signed)
 Complex Care Management Note Care Guide Note  09/18/2023 Name: Delron Comer MRN: [id number] DOB: [birth year]   Complex Care Management Outreach Attempts: An unsuccessful telephone outreach was attempted today to offer the patient information about available complex care management services.  Follow Up Plan:  Additional outreach attempts will be made to offer the patient complex care management information and services.   Encounter Outcome:  No Answer  [place] [facility] Care Guide  Direct Dial:[phone] Fax:[phone] Website: [email or web address]

## 2023-09-19 ENCOUNTER — Telehealth: Payer: Self-pay | Admitting: *Deleted

## 2023-09-19 NOTE — Progress Notes (Signed)
 Complex Care Management Note Care Guide Note  09/19/2023 Name: Rice Walsh MRN: [id number] DOB: 28-Sep-1950   Complex Care Management Outreach Attempts: A second unsuccessful outreach was attempted today to offer the patient with information about available complex care management services.  Follow Up Plan:  Additional outreach attempts will be made to offer the patient complex care management information and services.   Encounter Outcome:  No Answer  [facility] Health[facility] Care Guide  Direct Dial:[phone] [phone] Website: [email or web address]

## 2023-09-25 ENCOUNTER — Telehealth: Payer: Self-pay | Admitting: *Deleted

## 2023-09-25 NOTE — Progress Notes (Signed)
 Complex Care Management Note Care Guide Note  09/25/2023 Name: Jovani Flury MRN: [id number] DOB: [birth year]  Jamonta Goerner is a 73 y.o. year old male who is a primary care patient of Joshua Debby CROME, MD . The community resource team was consulted for assistance with Transportation Needs  and Food Insecurity  SDOH screenings and interventions completed:  Yes     SDOH Interventions Today    Flowsheet Row Most Recent Value  SDOH Interventions   Food Insecurity Interventions Community Resources Provided  [Will mail the food bank]  Transportation Interventions Intervention Not Indicated  [has transportation straightened out]     Care guide performed the following interventions: Patient provided with information about care guide support team and interviewed to confirm resource needs.  Follow Up Plan:  No further follow up planned at this time. The patient has been provided with needed resources.  Encounter Outcome:  Patient Visit Completed  [place]  [facility] HealthPopulation Health Care Guide  Direct Dial:[phone] Fax:[phone] Website: [email or web address]

## 2023-10-08 ENCOUNTER — Other Ambulatory Visit: Payer: Self-pay | Admitting: Cardiology

## 2023-11-19 ENCOUNTER — Telehealth: Payer: Self-pay | Admitting: *Deleted

## 2023-11-19 NOTE — Progress Notes (Unsigned)
 Complex Care Management Care Guide Note  11/19/2023 Name: Nathan Stafford MRN: [id number] DOB: [birth year]  Nathan Stafford is a 73 y.o. year old male who is a primary care patient of Joshua Debby CROME, MD and is actively engaged with the care management team. I reached out to Linzy Dandy by phone today to assist with re-scheduling  with the Licensed Clinical Child psychotherapist.  Follow up plan: Unsuccessful telephone outreach attempt made. A HIPAA compliant phone message was left for the patient providing contact information and requesting a return call.  [name], CMA [place]  [facility], [facility] Guide Direct Dial: [phone]  Fax: [phone] Website: [email or web address]

## 2023-11-20 NOTE — Progress Notes (Signed)
 Complex Care Management Care Guide Note  11/20/2023 Name: Nathan Stafford MRN: [id number] DOB: May 28, 1950  Nathan Stafford is a 73 y.o. year old male who is a primary care patient of Joshua Debby CROME, MD and is actively engaged with the care management team. I reached out to Linzy Dandy by phone today to assist with re-scheduling  with the Licensed Clinical Child psychotherapist.  Follow up plan: Unsuccessful telephone outreach attempt made. A HIPAA compliant phone message was left for the patient providing contact information and requesting a return call. No further outreach attempts will be made due to inability to maintain patient contact.   [name], CMA [place]  [facility], [facility] Guide Direct Dial: [phone]  Fax: [phone] Website: [email or web address]

## 2023-12-19 ENCOUNTER — Encounter: Payer: Self-pay | Admitting: *Deleted

## 2023-12-19 NOTE — Progress Notes (Signed)
 Nathan Stafford                                          MRN: [id number]   12/19/2023   The VBCI Quality Team Specialist reviewed this patient medical record for the purposes of chart review for care gap closure. The following were reviewed: chart review for care gap closure-controlling blood pressure.    VBCI Quality Team

## 2024-01-07 ENCOUNTER — Other Ambulatory Visit: Payer: Self-pay | Admitting: Emergency Medicine

## 2024-01-07 DIAGNOSIS — I429 Cardiomyopathy, unspecified: Secondary | ICD-10-CM

## 2024-01-24 ENCOUNTER — Emergency Department (HOSPITAL_COMMUNITY)
Admission: EM | Admit: 2024-01-24 | Discharge: 2024-01-24 | Disposition: A | Discharge status: Home / Self Care | Attending: Emergency Medicine | Admitting: Emergency Medicine

## 2024-01-24 ENCOUNTER — Emergency Department (HOSPITAL_COMMUNITY)

## 2024-01-24 DIAGNOSIS — S0990XA Unspecified injury of head, initial encounter: Secondary | ICD-10-CM | POA: Diagnosis not present

## 2024-01-24 DIAGNOSIS — Z7982 Long term (current) use of aspirin: Secondary | ICD-10-CM | POA: Insufficient documentation

## 2024-01-24 DIAGNOSIS — M47814 Spondylosis without myelopathy or radiculopathy, thoracic region: Secondary | ICD-10-CM | POA: Diagnosis not present

## 2024-01-24 DIAGNOSIS — S161XXA Strain of muscle, fascia and tendon at neck level, initial encounter: Secondary | ICD-10-CM | POA: Diagnosis not present

## 2024-01-24 DIAGNOSIS — M546 Pain in thoracic spine: Secondary | ICD-10-CM | POA: Insufficient documentation

## 2024-01-24 DIAGNOSIS — Z79899 Other long term (current) drug therapy: Secondary | ICD-10-CM | POA: Diagnosis not present

## 2024-01-24 DIAGNOSIS — R9089 Other abnormal findings on diagnostic imaging of central nervous system: Secondary | ICD-10-CM | POA: Diagnosis not present

## 2024-01-24 DIAGNOSIS — R519 Headache, unspecified: Secondary | ICD-10-CM | POA: Diagnosis not present

## 2024-01-24 DIAGNOSIS — I10 Essential (primary) hypertension: Secondary | ICD-10-CM | POA: Diagnosis not present

## 2024-01-24 DIAGNOSIS — Y92410 Unspecified street and highway as the place of occurrence of the external cause: Secondary | ICD-10-CM | POA: Insufficient documentation

## 2024-01-24 DIAGNOSIS — I6782 Cerebral ischemia: Secondary | ICD-10-CM | POA: Diagnosis not present

## 2024-01-24 DIAGNOSIS — S199XXA Unspecified injury of neck, initial encounter: Secondary | ICD-10-CM | POA: Diagnosis not present

## 2024-01-24 MED ORDER — AMLODIPINE BESYLATE 5 MG PO TABS
10.0000 mg | ORAL_TABLET | Freq: Every day | ORAL | Status: DC
  Administered 2024-01-24: 10 mg via ORAL
  Filled 2024-01-24: qty 2

## 2024-01-24 MED ORDER — CYCLOBENZAPRINE HCL 10 MG PO TABS: 10.0000 mg | ORAL_TABLET | Freq: Two times a day (BID) | ORAL | 0 refills | Status: AC | PRN

## 2024-01-24 MED ORDER — ACETAMINOPHEN 325 MG PO TABS
650.0000 mg | ORAL_TABLET | Freq: Once | ORAL | Status: AC
  Administered 2024-01-24: 650 mg via ORAL
  Filled 2024-01-24: qty 2
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
## 2024-01-24 NOTE — ED Notes (Signed)
RN notified of elevated BP.
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
## 2024-01-24 NOTE — ED Triage Notes (Signed)
 Patient does have HTN and has been out of his BP meds x 2 days.

## 2024-02-14 ENCOUNTER — Encounter (HOSPITAL_COMMUNITY): Payer: Self-pay | Admitting: Student in an Organized Health Care Education/Training Program

## 2024-02-14 ENCOUNTER — Inpatient Hospital Stay (HOSPITAL_COMMUNITY)
Admission: EM | Admit: 2024-02-14 | Discharge: 2024-02-24 | DRG: 304 | Disposition: A | Discharge status: Skilled Nursing Facility | Attending: Internal Medicine | Admitting: Internal Medicine

## 2024-02-14 ENCOUNTER — Emergency Department (HOSPITAL_COMMUNITY)

## 2024-02-14 ENCOUNTER — Other Ambulatory Visit: Payer: Self-pay

## 2024-02-14 ENCOUNTER — Inpatient Hospital Stay (HOSPITAL_COMMUNITY)

## 2024-02-14 DIAGNOSIS — I69391 Dysphagia following cerebral infarction: Secondary | ICD-10-CM | POA: Diagnosis not present

## 2024-02-14 DIAGNOSIS — K219 Gastro-esophageal reflux disease without esophagitis: Secondary | ICD-10-CM | POA: Diagnosis present

## 2024-02-14 DIAGNOSIS — I429 Cardiomyopathy, unspecified: Secondary | ICD-10-CM | POA: Diagnosis present

## 2024-02-14 DIAGNOSIS — E785 Hyperlipidemia, unspecified: Secondary | ICD-10-CM | POA: Diagnosis present

## 2024-02-14 DIAGNOSIS — Y900 Blood alcohol level of less than 20 mg/100 ml: Secondary | ICD-10-CM | POA: Diagnosis present

## 2024-02-14 DIAGNOSIS — F1010 Alcohol abuse, uncomplicated: Secondary | ICD-10-CM | POA: Diagnosis present

## 2024-02-14 DIAGNOSIS — K5900 Constipation, unspecified: Secondary | ICD-10-CM | POA: Diagnosis present

## 2024-02-14 DIAGNOSIS — R1310 Dysphagia, unspecified: Secondary | ICD-10-CM | POA: Diagnosis present

## 2024-02-14 DIAGNOSIS — I639 Cerebral infarction, unspecified: Secondary | ICD-10-CM | POA: Diagnosis not present

## 2024-02-14 DIAGNOSIS — G8194 Hemiplegia, unspecified affecting left nondominant side: Secondary | ICD-10-CM | POA: Diagnosis present

## 2024-02-14 DIAGNOSIS — R29708 NIHSS score 8: Secondary | ICD-10-CM | POA: Diagnosis not present

## 2024-02-14 DIAGNOSIS — Z751 Person awaiting admission to adequate facility elsewhere: Secondary | ICD-10-CM

## 2024-02-14 DIAGNOSIS — R531 Weakness: Secondary | ICD-10-CM | POA: Diagnosis not present

## 2024-02-14 DIAGNOSIS — H409 Unspecified glaucoma: Secondary | ICD-10-CM | POA: Diagnosis present

## 2024-02-14 DIAGNOSIS — E519 Thiamine deficiency, unspecified: Secondary | ICD-10-CM | POA: Diagnosis present

## 2024-02-14 DIAGNOSIS — R29810 Facial weakness: Secondary | ICD-10-CM | POA: Diagnosis present

## 2024-02-14 DIAGNOSIS — Z833 Family history of diabetes mellitus: Secondary | ICD-10-CM

## 2024-02-14 DIAGNOSIS — I619 Nontraumatic intracerebral hemorrhage, unspecified: Principal | ICD-10-CM | POA: Diagnosis present

## 2024-02-14 DIAGNOSIS — F17200 Nicotine dependence, unspecified, uncomplicated: Secondary | ICD-10-CM | POA: Diagnosis not present

## 2024-02-14 DIAGNOSIS — E119 Type 2 diabetes mellitus without complications: Secondary | ICD-10-CM | POA: Diagnosis not present

## 2024-02-14 DIAGNOSIS — I680 Cerebral amyloid angiopathy: Secondary | ICD-10-CM | POA: Diagnosis not present

## 2024-02-14 DIAGNOSIS — I161 Hypertensive emergency: Principal | ICD-10-CM | POA: Diagnosis present

## 2024-02-14 DIAGNOSIS — I609 Nontraumatic subarachnoid hemorrhage, unspecified: Secondary | ICD-10-CM | POA: Diagnosis not present

## 2024-02-14 DIAGNOSIS — Z7982 Long term (current) use of aspirin: Secondary | ICD-10-CM | POA: Diagnosis not present

## 2024-02-14 DIAGNOSIS — I611 Nontraumatic intracerebral hemorrhage in hemisphere, cortical: Secondary | ICD-10-CM | POA: Diagnosis not present

## 2024-02-14 DIAGNOSIS — I10 Essential (primary) hypertension: Secondary | ICD-10-CM | POA: Diagnosis not present

## 2024-02-14 DIAGNOSIS — Z79899 Other long term (current) drug therapy: Secondary | ICD-10-CM | POA: Diagnosis not present

## 2024-02-14 DIAGNOSIS — E859 Amyloidosis, unspecified: Secondary | ICD-10-CM | POA: Diagnosis not present

## 2024-02-14 DIAGNOSIS — R7303 Prediabetes: Secondary | ICD-10-CM | POA: Diagnosis not present

## 2024-02-14 DIAGNOSIS — F1090 Alcohol use, unspecified, uncomplicated: Secondary | ICD-10-CM | POA: Diagnosis not present

## 2024-02-14 DIAGNOSIS — G936 Cerebral edema: Secondary | ICD-10-CM | POA: Diagnosis not present

## 2024-02-14 DIAGNOSIS — R4781 Slurred speech: Secondary | ICD-10-CM | POA: Diagnosis not present

## 2024-02-14 DIAGNOSIS — G459 Transient cerebral ischemic attack, unspecified: Secondary | ICD-10-CM | POA: Diagnosis not present

## 2024-02-14 LAB — MRSA NEXT GEN BY PCR, NASAL: MRSA by PCR Next Gen: NOT DETECTED

## 2024-02-14 LAB — DIFFERENTIAL
Abs Immature Granulocytes: 0.02 K/uL (ref 0.00–0.07)
Basophils Absolute: 0 K/uL (ref 0.0–0.1)
Basophils Relative: 0 %
Eosinophils Absolute: 0 K/uL (ref 0.0–0.5)
Eosinophils Relative: 1 %
Immature Granulocytes: 0 %
Lymphocytes Relative: 25 %
Lymphs Abs: 1.6 K/uL (ref 0.7–4.0)
Monocytes Absolute: 0.8 K/uL (ref 0.1–1.0)
Monocytes Relative: 13 %
Neutro Abs: 3.9 K/uL (ref 1.7–7.7)
Neutrophils Relative %: 61 %

## 2024-02-14 LAB — CBG MONITORING, ED: Glucose-Capillary: 107 mg/dL — ABNORMAL HIGH (ref 70–99)

## 2024-02-14 LAB — CBC
HCT: 46.5 % (ref 39.0–52.0)
Hemoglobin: 15.4 g/dL (ref 13.0–17.0)
MCH: 30 pg (ref 26.0–34.0)
MCHC: 33.1 g/dL (ref 30.0–36.0)
MCV: 90.5 fL (ref 80.0–100.0)
Platelets: 157 K/uL (ref 150–400)
RBC: 5.14 MIL/uL (ref 4.22–5.81)
RDW: 14.6 % (ref 11.5–15.5)
WBC: 6.4 K/uL (ref 4.0–10.5)
nRBC: 0 % (ref 0.0–0.2)

## 2024-02-14 LAB — LIPID PANEL
Cholesterol: 223 mg/dL — ABNORMAL HIGH (ref 0–200)
HDL: 46 mg/dL (ref >40)
LDL Cholesterol: 97 mg/dL (ref 0–99)
Total CHOL/HDL Ratio: 4.8 ratio
Triglycerides: 400 mg/dL — ABNORMAL HIGH (ref <150)
VLDL: 80 mg/dL — ABNORMAL HIGH (ref 0–40)

## 2024-02-14 LAB — COMPREHENSIVE METABOLIC PANEL WITH GFR
ALT: 45 U/L — ABNORMAL HIGH (ref 0–44)
AST: 55 U/L — ABNORMAL HIGH (ref 15–41)
Albumin: 3.5 g/dL (ref 3.5–5.0)
Alkaline Phosphatase: 61 U/L (ref 38–126)
Anion gap: 5 (ref 5–15)
BUN: 10 mg/dL (ref 8–23)
CO2: 27 mmol/L (ref 22–32)
Calcium: 9.2 mg/dL (ref 8.9–10.3)
Chloride: 108 mmol/L (ref 98–111)
Creatinine, Ser: 0.91 mg/dL (ref 0.61–1.24)
GFR, Estimated: >60 mL/min (ref >60)
Glucose, Bld: 109 mg/dL — ABNORMAL HIGH (ref 70–99)
Potassium: 4.1 mmol/L (ref 3.5–5.1)
Sodium: 140 mmol/L (ref 135–145)
Total Bilirubin: 0.7 mg/dL (ref 0.0–1.2)
Total Protein: 6.7 g/dL (ref 6.5–8.1)

## 2024-02-14 LAB — ETHANOL: Alcohol, Ethyl (B): <15 mg/dL (ref <15)

## 2024-02-14 LAB — I-STAT CHEM 8, ED
BUN: 12 mg/dL (ref 8–23)
Calcium, Ion: 1.17 mmol/L (ref 1.15–1.40)
Chloride: 107 mmol/L (ref 98–111)
Creatinine, Ser: 1 mg/dL (ref 0.61–1.24)
Glucose, Bld: 107 mg/dL — ABNORMAL HIGH (ref 70–99)
HCT: 50 % (ref 39.0–52.0)
Hemoglobin: 17 g/dL (ref 13.0–17.0)
Potassium: 4 mmol/L (ref 3.5–5.1)
Sodium: 144 mmol/L (ref 135–145)
TCO2: 25 mmol/L (ref 22–32)

## 2024-02-14 LAB — HEMOGLOBIN A1C
Hgb A1c MFr Bld: 4.9 % (ref 4.8–5.6)
Mean Plasma Glucose: 93.93 mg/dL

## 2024-02-14 LAB — PROTIME-INR
INR: 1 (ref 0.8–1.2)
Prothrombin Time: 13.9 s (ref 11.4–15.2)

## 2024-02-14 LAB — APTT: aPTT: 28 s (ref 24–36)

## 2024-02-14 MED ORDER — GADOBUTROL 1 MMOL/ML IV SOLN
8.0000 mL | Freq: Once | INTRAVENOUS | Status: AC | PRN
  Administered 2024-02-14: 8 mL via INTRAVENOUS

## 2024-02-14 MED ORDER — SENNOSIDES-DOCUSATE SODIUM 8.6-50 MG PO TABS
1.0000 | ORAL_TABLET | Freq: Two times a day (BID) | ORAL | Status: DC
  Administered 2024-02-15 – 2024-02-19 (×9): 1 via ORAL
  Filled 2024-02-14 (×9): qty 1

## 2024-02-14 MED ORDER — ACETAMINOPHEN 160 MG/5ML PO SOLN: 650.0000 mg | ORAL | Status: DC | PRN

## 2024-02-14 MED ORDER — CHLORHEXIDINE GLUCONATE CLOTH 2 % EX PADS
6.0000 | MEDICATED_PAD | Freq: Every day | CUTANEOUS | Status: DC
  Administered 2024-02-14 – 2024-02-19 (×6): 6 via TOPICAL

## 2024-02-14 MED ORDER — SODIUM CHLORIDE 0.9% FLUSH: 3.0000 mL | Freq: Once | INTRAVENOUS | Status: DC

## 2024-02-14 MED ORDER — STROKE: EARLY STAGES OF RECOVERY BOOK
Freq: Once | Status: AC
  Filled 2024-02-14: qty 1

## 2024-02-14 MED ORDER — CLEVIDIPINE BUTYRATE 0.5 MG/ML IV EMUL
INTRAVENOUS | Status: AC
  Filled 2024-02-14: qty 50

## 2024-02-14 MED ORDER — ACETAMINOPHEN 325 MG PO TABS
650.0000 mg | ORAL_TABLET | ORAL | Status: DC | PRN
  Administered 2024-02-20 – 2024-02-24 (×3): 650 mg via ORAL
  Filled 2024-02-14 (×4): qty 2

## 2024-02-14 MED ORDER — ACETAMINOPHEN 650 MG RE SUPP: 650.0000 mg | RECTAL | Status: DC | PRN

## 2024-02-14 MED ORDER — HYDRALAZINE HCL 20 MG/ML IJ SOLN
20.0000 mg | Freq: Once | INTRAMUSCULAR | Status: AC
  Administered 2024-02-14: 20 mg via INTRAVENOUS

## 2024-02-14 MED ORDER — IOHEXOL 350 MG/ML SOLN
75.0000 mL | Freq: Once | INTRAVENOUS | Status: AC | PRN
  Administered 2024-02-14: 75 mL via INTRAVENOUS

## 2024-02-14 MED ORDER — NICOTINE 21 MG/24HR TD PT24
21.0000 mg | MEDICATED_PATCH | Freq: Every day | TRANSDERMAL | Status: DC
  Administered 2024-02-14 – 2024-02-24 (×9): 21 mg via TRANSDERMAL
  Filled 2024-02-14 (×11): qty 1

## 2024-02-14 MED ORDER — CLEVIDIPINE BUTYRATE 0.5 MG/ML IV EMUL
0.0000 mg/h | INTRAVENOUS | Status: DC
  Administered 2024-02-14: 23 mg/h via INTRAVENOUS
  Administered 2024-02-14 (×2): 30 mg/h via INTRAVENOUS
  Administered 2024-02-14: 25 mg/h via INTRAVENOUS
  Administered 2024-02-14: 2 mg/h via INTRAVENOUS
  Administered 2024-02-14: 30 mg/h via INTRAVENOUS
  Administered 2024-02-14: 16 mg/h via INTRAVENOUS
  Administered 2024-02-14: 32 mg/h via INTRAVENOUS
  Administered 2024-02-15: 30 mg/h via INTRAVENOUS
  Administered 2024-02-15 (×3): 32 mg/h via INTRAVENOUS
  Administered 2024-02-15: 18 mg/h via INTRAVENOUS
  Administered 2024-02-15: 32 mg/h via INTRAVENOUS
  Filled 2024-02-14: qty 50
  Filled 2024-02-14 (×5): qty 100
  Filled 2024-02-14: qty 200
  Filled 2024-02-14: qty 100

## 2024-02-14 MED ORDER — LABETALOL HCL 5 MG/ML IV SOLN
10.0000 mg | INTRAVENOUS | Status: DC | PRN
  Administered 2024-02-14 – 2024-02-15 (×2): 10 mg via INTRAVENOUS
  Filled 2024-02-14 (×2): qty 4

## 2024-02-14 MED ORDER — HYDRALAZINE HCL 20 MG/ML IJ SOLN
10.0000 mg | INTRAMUSCULAR | Status: DC | PRN
  Administered 2024-02-14 – 2024-02-15 (×2): 10 mg via INTRAVENOUS
  Filled 2024-02-14 (×2): qty 1

## 2024-02-14 MED ORDER — PANTOPRAZOLE SODIUM 40 MG IV SOLR
40.0000 mg | Freq: Every day | INTRAVENOUS | Status: DC
  Administered 2024-02-14: 40 mg via INTRAVENOUS
  Filled 2024-02-14: qty 10

## 2024-02-14 NOTE — H&P (Addendum)
 NEUROLOGY H&P NOTE   Date of service: February 14, 2024 Patient Name: Nathan Stafford MRN:  [id number] DOB:  1950/11/02 Chief Complaint: code stroke - left sided weakness  History of Present Illness  Nathan Stafford is a 73 y.o. male with hx of hypertension, hyperlipidemia, glaucoma, tobacco abuse, presenting to the emergency department for evaluation of sudden onset of left-sided weakness.  He wentoutside to smoke after getting up this morning around 6 AM and was normal.  He sat down to watch TV and around 6:45 AM wanted to readjust himself in the couch but could not move the left side.  EMS was called.  They noted left-sided weakness and activated a code stroke for sudden onset of focal neurological deficit. He was seen at the ED bridge taken for a stat CT that showed a bleed-see details below. He continues to smoke.  He says drinks occasionally but later caretaker comes in and says that he drinks a lot usually.  Had a shot of liquor yesterday. Systolic blood pressure in the 190s on arrival Heart rate in the 60s No headache.  No nausea vomiting  Last known well: 6 AM Modified rankin score: 0-Completely asymptomatic and back to baseline post- stroke ICH Score: 0 tNKASE: Not offered due to ICH Thrombectomy: not offered due to ICH NIHSS components Score: Comment  1a Level of Conscious 0[x]  1[]  2[]  3[]      1b LOC Questions 0[x]  1[]  2[]       1c LOC Commands 0[x]  1[]  2[]       2 Best Gaze 0[x]  1[]  2[]       3 Visual 0[x]  1[]  2[]  3[]      4 Facial Palsy 0[]  1[x]  2[]  3[]      5a Motor Arm - left 0[]  1[]  2[x]  3[]  4[]  UN[]    5b Motor Arm - Right 0[x]  1[]  2[]  3[]  4[]  UN[]    6a Motor Leg - Left 0[]  1[]  2[x]  3[]  4[]  UN[]    6b Motor Leg - Right 0[x]  1[]  2[]  3[]  4[]  UN[]    7 Limb Ataxia 0[x]  1[]  2[]  UN[]      8 Sensory 0[]  1[]  2[x]  UN[]      9 Best Language 0[x]  1[]  2[]  3[]      10 Dysarthria 0[]  1[x]  2[]  UN[]      11 Extinct. and Inattention 0[x]  1[]  2[]       TOTAL: 8      ROS  Comprehensive ROS  performed and pertinent positives documented in the HPI.   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   GERD (gastroesophageal reflux disease)    Glaucoma    Hematuria    HLD (hyperlipidemia)    HTN (hypertension)    Perinephric hematoma    Past Surgical History:  Procedure Laterality Date   colonscopy     EYE SURGERY Right 1993   LASIK     Family History  Problem Relation Age of Onset   Diabetes Father    Social history: Tobacco abuse  Allergies[1]  Medications  (Not in a hospital admission)    Vitals   Vitals:   02/14/24 0849 02/14/24 0853 02/14/24 0857 02/14/24 0900  BP: (!) 139/56 (!) 129/59 (!) 119/51 (!) 120/53  Pulse: 76 72 64 62  Resp: 18 18 18 18   Temp:  TempSrc:      SpO2: 100% 100% 100% 100%  Weight:      Height:         Body mass index is 24.06 kg/m.  Physical Exam   GEN: Awake alert in no distress HEENT: Normocephalic atraumatic Lungs: Clear Neurological exam Awake alert oriented x 3 Mild dysarthria No aphasia Cranial nerves: Pupils equal round react light, extract movements intact, visual fields full, face appears symmetric with there is mild left nasolabial fold flattening, tongue and palate midline. Motor examination with significant drift in the left upper and lower extremity.  No drift on the right. Sensation absent to light touch on the left leg.  Otherwise intact Coordination: No dysmetria on the right.  Dysmetria on the left proportionate to weakness   Labs   CBC:  Recent Labs  Lab 02/14/24 0751 02/14/24 0759  WBC 6.4  --   NEUTROABS 3.9  --   HGB 15.4 17.0  HCT 46.5 50.0  MCV 90.5  --   PLT 157  --     Basic Metabolic Panel:  Lab Results  Component Value Date   NA 144 02/14/2024    K 4.0 02/14/2024   CO2 27 02/14/2024   GLUCOSE 107 (H) 02/14/2024   BUN 12 02/14/2024   CREATININE 1.00 02/14/2024   CALCIUM  9.2 02/14/2024   GFRNONAA >60 02/14/2024   GFRAA 100 08/14/2017   Lipid Panel:  Lab Results  Component Value Date   LDLCALC 101 (H) 11/12/2022   HgbA1c:  Lab Results  Component Value Date   HGBA1C 5.6 08/21/2023   Urine Drug Screen: No results found for: LABOPIA, COCAINSCRNUR, LABBENZ, AMPHETMU, THCU, LABBARB  Alcohol Level     Component Value Date/Time   [facility] <15 02/14/2024 0751    CT Head without contrast(Personally reviewed): Left posterior frontal ICH, with mild SAH  CT angio Head and Neck with contrast(Personally reviewed): No LVO.  No spot sign.   Impression   Nathan Stafford is a 73 y.o. male with above past medical history presenting for sudden onset of left-sided weakness, noted to have a left proximal frontal ICH with mild ICH in the setting of hypertension.   Likely diagnosis is hypertensive hemorrhage.  Impression:  Hemorrhagic stroke, secondary to hypertensive emergency Hypertensive emergency Tobacco abuse Alcohol abuse Essential hypertension    Recommendations  Admit to neurological ICU Frequent neurochecks Telemetry Repeat head CT in 6 hours MRI brain 6 to 8 hours after that No antiplatelets or anticoagulants SCD for DVT prophylaxis 2D echo, A1c, lipid panel Blood pressure goal systolic 130-150.  Given 1 dose of hydralazine  and Cleviprex  started in the ER. Therapy assessments Replete electrolytes as necessary Nicotine  patch CIWA protocol  Full code ______________________________________________________________________   Signed,  Eligio Lav, MD [facility]    CRITICAL CARE ATTESTATION Performed by: Eligio Lav, MD Total critical care time: 59 minutes Critical care time was exclusive of separately billable procedures and treating other patients and/or supervising  APPs/Residents/Students Critical care was necessary to treat or prevent imminent or life-threatening deterioration. This patient is critically ill and at significant risk for neurological worsening and/or death and care requires constant monitoring. Critical care was time spent personally by me on the following activities: development of treatment plan with patient and/or surrogate as well as nursing, discussions with consultants, evaluation of patient's response to treatment, examination of patient, obtaining history from patient or surrogate, ordering and performing treatments and interventions, ordering and review of laboratory studies, ordering and review of radiographic studies,  pulse oximetry, re-evaluation of patient's condition, participation in multidisciplinary rounds and medical decision making of high complexity in the care of this patient.  Present on admission: Hemiparesis, intracerebral hemorrhage     [1]  Allergies Allergen Reactions   Ace Inhibitors Swelling

## 2024-02-14 NOTE — ED Notes (Signed)
 Transported to MRI

## 2024-02-14 NOTE — ED Notes (Signed)
 PT has passed swallow screen at this time. Physician notified.

## 2024-02-14 NOTE — ED Provider Notes (Signed)
 [place] EMERGENCY DEPARTMENT AT [facility] Provider Note   CSN: [id number] Arrival date & time: 02/14/24  9250     Patient presents with: No chief complaint on file.   Nathan Stafford is a 73 y.o. male.   73 year old male presented as a code stroke.  Sudden unilateral weakness this morning.        Prior to Admission medications  Medication Sig Start Date End Date Taking? Authorizing Provider  amLODipine  (NORVASC ) 10 MG tablet Take 1 tablet (10 mg total) by mouth daily. 08/21/23   Joshua Debby CROME, MD  aspirin  81 MG tablet Take 1 tablet (81 mg total) by mouth daily. 05/09/17   Gretta Ozell CROME, PA-C  brimonidine  (ALPHAGAN ) 0.2 % ophthalmic solution Place 1 drop into both eyes 2 (two) times daily.     [provider]  cyclobenzaprine  (FLEXERIL ) 10 MG tablet Take 1 tablet (10 mg total) by mouth 2 (two) times daily as needed for muscle spasms. 01/24/24   Randol Simmonds, MD  dorzolamide -timolol  (COSOPT ) 22.3-6.8 MG/ML ophthalmic solution Place 1 drop into both eyes 2 (two) times daily.     [provider]  latanoprost  (XALATAN ) 0.005 % ophthalmic solution Place 1 drop into both eyes at bedtime. 05/05/22   [provider]  metoprolol  succinate (TOPROL -XL) 100 MG 24 hr tablet Take 1 tablet by mouth once daily 10/09/23   Anner Alm ORN, MD  Omega-3 Fatty Acids (FISH OIL PO) Take by mouth as needed.    [provider]  sildenafil  (VIAGRA ) 50 MG tablet TAKE 1 TABLET BY MOUTH ONCE DAILY AS NEEDED FOR ERECTILE DYSFUNCTION 01/07/24   Purcell Emil Schanz, MD  simvastatin  (ZOCOR ) 20 MG tablet Take 1 tablet (20 mg total) by mouth daily at 6 PM. 08/21/23   Joshua Debby CROME, MD  spironolactone  (ALDACTONE ) 25 MG tablet Take 1 tablet (25 mg total) by mouth daily. 08/21/23   Joshua Debby CROME, MD  thiamine (VITAMIN B-1) 50 MG tablet Take 1 tablet (50 mg total) by mouth daily. 08/24/23   Joshua Debby CROME, MD    Allergies: Ace inhibitors    Review of Systems  Updated  Vital Signs Wt 85.2 kg   BMI 24.78 kg/m   Physical Exam Vitals and nursing note reviewed.  Constitutional:      General: He is not in acute distress.    Appearance: He is not toxic-appearing.  HENT:     Head: Normocephalic.     Nose: Nose normal.  Cardiovascular:     Rate and Rhythm: Normal rate.  Pulmonary:     Effort: Pulmonary effort is normal.  Abdominal:     General: There is no distension.  Skin:    General: Skin is warm and dry.     Capillary Refill: Capillary refill takes less than 2 seconds.  Neurological:     Mental Status: He is alert.     Comments: Weakness to left upper lower extremity  Psychiatric:        Mood and Affect: Mood normal.        Behavior: Behavior normal.     (all labs ordered are listed, but only abnormal results are displayed) Labs Reviewed  I-STAT CHEM 8, ED - Abnormal; Notable for the following components:      Result Value   Glucose, Bld 107 (*)    All other components within normal limits  CBG MONITORING, ED - Abnormal; Notable for the following components:   Glucose-Capillary 107 (*)    All  other components within normal limits  PROTIME-INR  APTT  CBC  DIFFERENTIAL  COMPREHENSIVE METABOLIC PANEL WITH GFR  ETHANOL    EKG: None  Radiology: No results found.   Procedures   Medications Ordered in the ED  sodium chloride  flush (NS) 0.9 % injection 3 mL (has no administration in time range)    Clinical Course as of 02/14/24 0807  Sat Feb 14, 2024  0759 On my interpretation; Appears to have hemorrhage on CT scan.  [TY]    Clinical Course User Index [TY] Neysa Caron PARAS, DO                                 Medical Decision Making 73 year old male presenting emergency department as a code stroke; left-sided weakness.  Not on a blood thinner per chart review.  CT head with what appears to be hemorrhagic stroke and therefore not a candidate for TNK. Hypertensive; BP meds ordered. Neurology to admit.   Amount and/or  Complexity of Data Reviewed Independent Historian: EMS    Details: Stable vitals.  Left-sided weakness.  Last known normal 6 AM External Data Reviewed:     Details: Does not appear to be taking blood thinner Labs: ordered. Decision-making details documented in ED Course.    Details: No significant metabolic derangements.  Normal glucose.  Mild elevation in his AST ALT.  Alcohol level negative Radiology: ordered and independent interpretation performed. Decision-making details documented in ED Course.    Details: See ED course ECG/medicine tests:     Details: Sinus rhythm.  80 bpm.  LVH pattern.  Risk Prescription drug management. Decision regarding hospitalization. Diagnosis or treatment significantly limited by social determinants of health. Risk Details: Poor health literacy       Final diagnoses:  None    ED Discharge Orders     None          Neysa Caron PARAS, DO 02/14/24 [phone]

## 2024-02-14 NOTE — ED Notes (Signed)
 Patient had increased drooping to left side of his mouth increased slurred speech and c/o being very tired , Dr. Brady aware

## 2024-02-14 NOTE — ED Notes (Signed)
 Patient is now able to feel sensation on both legs states they fell the same

## 2024-02-14 NOTE — ED Notes (Addendum)
 Report made to receiving RN, PT is clean and dry at this time.Called and made floor aware of PT arrival, This RN will transport

## 2024-02-14 NOTE — Progress Notes (Addendum)
 Patient transferred from ED to 4NICU.  Patient max on cleviprex  but bp not within goal.  Dr. Voncile updated. IV flushed and leaking.  New IVs placed.    Patient passed [place] swallowing in ED.  Diet order per Dr. Arora.

## 2024-02-14 NOTE — Code Documentation (Signed)
 Stroke Response Nurse Documentation Code Documentation  Nathan Stafford is a 73 y.o. male arriving to [facility]  via [facility] on 02/14/2024 with past medical hx of HTN, HLD, glaucoma, tobacco use. On No antithrombotic. Code stroke was activated by EMS.   Patient from home where he was LKW at 0600 and now complaining of left sided weakness, left facial droop, slurred speech. Per patient, he woke up at 0600 this morning, went outside to smoke and was in his normal state of health. Shortly after he sat down on the couch to watch TV and tried to reposition at 0645 but noticed he could not move his left side.   Stroke team at the bedside on patient arrival. Labs drawn and patient cleared for CT by EDP. Patient to CT with team. NIHSS 8, see documentation for details and code stroke times. Patient with left facial droop, left arm weakness, left leg weakness, left decreased sensation, and Sensory  neglect on exam. The following imaging was completed:  CT Head and CTA. Patient is not a candidate for IV Thrombolytic due to ICH per MD. Patient is not a candidate for IR due to ICH per MD.   Care Plan: Pupil Checks/NIHSS/VS q1hr, SBP Goal 130-150, NPO until swallow screen passed, repeat CT H at 1400.   Bedside handoff with ED RN Burnard.    Annabella DELENA Bame  Stroke Response RN

## 2024-02-14 NOTE — ED Notes (Signed)
 Returned from MRI

## 2024-02-14 NOTE — ED Notes (Signed)
 C/o feeling hot , patient is cool to touch  speech is more clear , family states he is very anxious

## 2024-02-14 NOTE — ED Triage Notes (Signed)
 Patient presents to ED via [facility] from home  LKW at 0600am was trying to get up off the couch  at 0645 to go smoke , friend states he has been smoking much more now, patient is currently alert oriented to place and month, decreased movement in left arm and leg.

## 2024-02-15 ENCOUNTER — Inpatient Hospital Stay (HOSPITAL_COMMUNITY)

## 2024-02-15 MED ORDER — METOPROLOL SUCCINATE ER 100 MG PO TB24
100.0000 mg | ORAL_TABLET | Freq: Every day | ORAL | Status: DC
  Administered 2024-02-15 – 2024-02-24 (×10): 100 mg via ORAL
  Filled 2024-02-15 (×3): qty 1
  Filled 2024-02-15: qty 2
  Filled 2024-02-15: qty 1
  Filled 2024-02-15 (×3): qty 2
  Filled 2024-02-15: qty 1
  Filled 2024-02-15: qty 2

## 2024-02-15 MED ORDER — BLOOD PRESSURE KIT: 1.0000 | PACK | Freq: Every day | 0 refills | Status: AC

## 2024-02-15 MED ORDER — BRIMONIDINE TARTRATE 0.2 % OP SOLN
1.0000 [drp] | Freq: Two times a day (BID) | OPHTHALMIC | Status: DC
  Administered 2024-02-15 – 2024-02-24 (×17): 1 [drp] via OPHTHALMIC
  Filled 2024-02-15 (×2): qty 5

## 2024-02-15 MED ORDER — HYDRALAZINE HCL 20 MG/ML IJ SOLN
20.0000 mg | INTRAMUSCULAR | Status: DC | PRN
  Administered 2024-02-15 – 2024-02-22 (×6): 20 mg via INTRAVENOUS
  Filled 2024-02-15 (×6): qty 1

## 2024-02-15 MED ORDER — DORZOLAMIDE HCL-TIMOLOL MAL 2-0.5 % OP SOLN
1.0000 [drp] | Freq: Two times a day (BID) | OPHTHALMIC | Status: DC
  Administered 2024-02-15 – 2024-02-24 (×17): 1 [drp] via OPHTHALMIC
  Filled 2024-02-15 (×2): qty 10

## 2024-02-15 MED ORDER — LATANOPROST 0.005 % OP SOLN
1.0000 [drp] | Freq: Every day | OPHTHALMIC | Status: DC
  Administered 2024-02-15 – 2024-02-23 (×9): 1 [drp] via OPHTHALMIC
  Filled 2024-02-15 (×2): qty 2.5

## 2024-02-15 MED ORDER — AMLODIPINE BESYLATE 5 MG PO TABS
5.0000 mg | ORAL_TABLET | Freq: Every day | ORAL | Status: DC
  Administered 2024-02-15: 5 mg via ORAL
  Filled 2024-02-15 (×2): qty 1

## 2024-02-15 MED ORDER — PANTOPRAZOLE SODIUM 40 MG PO TBEC
40.0000 mg | DELAYED_RELEASE_TABLET | Freq: Every day | ORAL | Status: DC
  Administered 2024-02-15 – 2024-02-23 (×8): 40 mg via ORAL
  Filled 2024-02-15 (×9): qty 1

## 2024-02-15 MED ORDER — CYCLOBENZAPRINE HCL 10 MG PO TABS: 10.0000 mg | ORAL_TABLET | Freq: Two times a day (BID) | ORAL | Status: DC | PRN

## 2024-02-15 MED ORDER — LABETALOL HCL 5 MG/ML IV SOLN
20.0000 mg | INTRAVENOUS | Status: DC | PRN
  Administered 2024-02-15 – 2024-02-17 (×5): 20 mg via INTRAVENOUS
  Filled 2024-02-15 (×6): qty 4

## 2024-02-15 NOTE — Discharge Instructions (Signed)
 Dear Linzy Dandy,   Congratulations for your interest in quitting smoking!  Find a program that suits you best: when you want to quit, how you need support, where you live, and how you like to learn.    If youre ready to get started TODAY, consider scheduling a visit through [email or web address].  Appointments are available from 8am to 8pm, Monday to Friday.   Most health insurance plans will cover some level of tobacco cessation visits and medications.    Additional Resources: [employer] are also available to help you quit & provide the support youll need. Many programs are available in both English and Spanish and have a long history of successfully helping people get off and stay off tobacco.    Quit Smoking Apps:  quitSTART at seriousbroker.de QuitGuide?at forgetparking.dk Online education and resources: Smokefree  at borders group.gov Free Telephone Coaching: QuitNow,  Call 1-800-QUIT-NOW ((531) 294-2067) or Text- Ready to (567)780-6598 *Quitline [employer] has teamed up with Medicaid to offer a free 14 week program    Vaping- Want to Quit? Free 24/7 support. Call [facility]  [place], [place], [place], [place], [place]  [facility]

## 2024-02-15 NOTE — Evaluation (Signed)
 Physical Therapy Evaluation Patient Details Name: Nathan Stafford MRN: [id number] DOB: 1950-08-22 Today's Date: 02/15/2024  History of Present Illness  73 yo M adm 12/13 sudden L side weakness. 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 PMH: HTN, HLD, glaucoma, tobacco abuse, cardiomyopathy, GERD, ETOH abuse   Clinical Impression  Pt admitted with above. PTA pt indep without AD, driving, and working. Pt lives with a roommate/long time friend, Elouise. Pt currently with L hemiparesis, L inattention, impaired balance, impaired coordination, delayed processing, impaired sequencing, requires assistx2 for mobility, and decreased insight to deficits and safety. Pt to greatly benefit from aggressive inpatient rehab program > 3 hrs a day to address above deficits and achieve maximal functional recovery. Acute PT to cont to follow.        If plan is discharge home, recommend the following: A lot of help with walking and/or transfers;A lot of help with bathing/dressing/bathroom;Assist for transportation;Supervision due to cognitive status;Assistance with feeding;Assistance with cooking/housework;Direct supervision/assist for medications management;Direct supervision/assist for financial management   Can travel by private vehicle        Equipment Recommendations  (TBD at next venue)  Recommendations for Other Services  Rehab consult    Functional Status Assessment Patient has had a recent decline in their functional status and/or demonstrates limited ability to make significant improvements in function in a reasonable and predictable amount of time     Precautions / Restrictions Precautions Precautions: Fall Precaution/Restrictions Comments: L sided neglect Restrictions Weight Bearing Restrictions Per Provider Order: No      Mobility   Bed Mobility Overal bed mobility: Needs Assistance Bed Mobility: Rolling, Sidelying to Sit, Sit to Supine Rolling: Mod assist Sidelying to sit: Max assist   Sit to supine: Max assist, +2 for physical assistance   General bed mobility comments: pt requiring maxA for L LE management despite max verbal and tactile cues, maxA for trunk elevation    Transfers Overall transfer level: Needs assistance Equipment used: 2 person hand held assist Transfers: Sit to/from Stand Sit to Stand: Max assist, +2 physical assistance           General transfer comment: face to face transfer, L knee/foot blocked, tactile cues at posterior L hip for extension, attempted to stand 2nd time however pt with significant L lateral lean and resistance compared to first time. With max verbal and tactile cues pt able to take 3 side steps to [facility] with modA to advance L LE.    Ambulation/Gait               General Gait Details: limited to 3 sides steps to [facility]  Stairs            Wheelchair Mobility     Tilt Bed    Modified Rankin (Stroke Patients Only) Modified Rankin (Stroke Patients Only) Pre-Morbid Rankin Score: No symptoms Modified Rankin: Moderately severe disability     Balance Overall balance assessment: Needs assistance Sitting-balance support: Feet supported, Single extremity supported Sitting balance-Leahy Scale: Poor Sitting balance - Comments: pt with significant L lateral to posterior lean requiring constant verbal cues to bring self back to midline (mirror therapist), pt unable to maintain. pt with quick onset to fatigue progressing to totalA to maintain EOB balance   Standing balance support: During functional activity, Bilateral upper extremity supported, Reliant on assistive device for balance Standing balance-Leahy Scale: Zero Standing  balance comment: maxAX2 to maintain standing, only able to complete sit to standx1                             Pertinent  Vitals/Pain Pain Assessment Pain Assessment: No/denies pain    Home Living Family/patient expects to be discharged to:: Private residence Living Arrangements: Non-relatives/Friends (roommate/friend Shawn) Available Help at Discharge: Family;Available PRN/intermittently (Shawn works 8-5 during the day) Type of Home: Apartment Home Access: Level entry       Home Layout: One level Home Equipment: None      Prior Function Prior Level of Function : Independent/Modified Independent;Working/employed;Driving             Mobility Comments: no AD ADLs Comments: indep     Extremity/Trunk Assessment   Upper Extremity Assessment Upper Extremity Assessment: LUE deficits/detail LUE Deficits / Details: able to initiate lifting arm at shlder but no movement in hand, trace at elbow, impaired sensation    Lower Extremity Assessment Lower Extremity Assessment: LLE deficits/detail LLE Deficits / Details: grossly 2+/5, impaired coordination and sensation    Cervical / Trunk Assessment Cervical / Trunk Assessment: Normal  Communication   Communication Communication: Impaired Factors Affecting Communication: Reduced clarity of speech    Cognition Arousal: Alert Behavior During Therapy: Flat affect   PT - Cognitive impairments: Awareness, Memory, Attention, Initiation, Sequencing, Problem solving, Safety/Judgement                       PT - Cognition Comments: pt with L sided inattention, poor recall, decreased insight to deficits and safety Following commands: Impaired Following commands impaired: Follows one step commands with increased time     Cueing Cueing Techniques: Verbal cues, Gestural cues, Tactile cues, Visual cues     General Comments General comments (skin integrity, edema, etc.): VSS    Exercises     Assessment/Plan    PT Assessment Patient needs continued PT services  PT Problem List Decreased strength;Decreased activity tolerance;Decreased  balance;Decreased mobility;Decreased coordination;Decreased cognition;Decreased knowledge of use of DME;Decreased safety awareness;Decreased knowledge of precautions       PT Treatment Interventions DME instruction;Gait training;Functional mobility training;Therapeutic activities;Therapeutic exercise;Balance training;Neuromuscular re-education;Cognitive remediation;Patient/family education    PT Goals (Current goals can be found in the Care Plan section)  Acute Rehab PT Goals Patient Stated Goal: home PT Goal Formulation: With patient/family Time For Goal Achievement: 02/29/24 Potential to Achieve Goals: Good    Frequency Min 4X/week     Co-evaluation               AM-PAC PT 6 Clicks Mobility  Outcome Measure Help needed turning from your back to your side while in a flat bed without using bedrails?: A Lot Help needed moving from lying on your back to sitting on the side of a flat bed without using bedrails?: A Lot Help needed moving to and from a bed to a chair (including a wheelchair)?: Total Help needed standing up from a chair using your arms (e.g., wheelchair or bedside chair)?: Total Help needed to walk in hospital room?: Total Help needed climbing 3-5 steps with a railing? : Total 6 Click Score: 8    End of Session Equipment Utilized During Treatment: Gait belt Activity Tolerance: Patient limited by fatigue Patient left: in bed;with call bell/phone within reach;with family/visitor present;with bed alarm set Nurse Communication: Mobility status (RN present to assist with transfer) PT Visit Diagnosis: Difficulty in walking, not elsewhere  classified (R26.2);Hemiplegia and hemiparesis;Muscle weakness (generalized) (M62.81) Hemiplegia - Right/Left: Left Hemiplegia - dominant/non-dominant: Non-dominant Hemiplegia - caused by: Nontraumatic intracerebral hemorrhage    Time: 8744-8672 PT Time Calculation (min) (ACUTE ONLY): 32 min   Charges:   PT Evaluation $PT Eval  Moderate Complexity: 1 Mod PT Treatments $Therapeutic Activity: 8-22 mins PT General Charges $$ ACUTE PT VISIT: 1 Visit         Norene Ames, PT, DPT Acute Rehabilitation Services Secure chat preferred Office #: [phone]   Norene CHRISTELLA Ames 02/15/2024, 2:41 PM

## 2024-02-15 NOTE — Progress Notes (Addendum)
 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   Seen in room with family at the bedside and son over facetime. Scans reviewed. Family requesting CM assistance with advanced directives and rehab, TOC consult placed. Blood pressure monitor kit sent to pharmacy.  Family admits patient has memory loss and some cognitive impairment at baseline OBJECTIVE  CBC    Component Value Date/Time   WBC 6.4 02/14/2024 0751   RBC 5.14 02/14/2024 0751   HGB 17.0 02/14/2024 0759   HGB 14.9 05/09/2017 1011   HCT 50.0 02/14/2024 0759   HCT 44.5 05/09/2017 1011   PLT 157 02/14/2024 0751   PLT 173 05/09/2017 1011   MCV 90.5 02/14/2024 0751   MCV 87 05/09/2017 1011   MCH 30.0 02/14/2024 0751   MCHC 33.1 02/14/2024 0751   RDW 14.6 02/14/2024 0751   RDW 15.7 (H) 05/09/2017 1011   LYMPHSABS 1.6 02/14/2024 0751   MONOABS 0.8 02/14/2024 0751   EOSABS 0.0 02/14/2024 0751   BASOSABS 0.0 02/14/2024 0751    BMET    Component Value Date/Time   NA 144 02/14/2024 0759   NA 142 05/11/2020 0944   K 4.0 02/14/2024 0759   CL 107 02/14/2024 0759   CO2 27 02/14/2024 0751   GLUCOSE 107 (H) 02/14/2024 0759   BUN 12 02/14/2024 0759   BUN 9 05/11/2020 0944   CREATININE 1.00 02/14/2024 0759   CREATININE 1.03 04/26/2015 1440   CALCIUM  9.2 02/14/2024 0751   EGFR 91 05/11/2020 0944   GFRNONAA >60 02/14/2024 0751   GFRNONAA 76 04/26/2015 1440    IMAGING past 24 hours MR BRAIN W WO CONTRAST Result Date: 02/14/2024 EXAM: MRI BRAIN WITH AND WITHOUT CONTRAST 02/14/2024 02:36:03 PM TECHNIQUE: Multiplanar multisequence MRI of the head/brain was performed with and without the administration of intravenous contrast (8 mL of Gadavist ). COMPARISON: Head CT and CTA  02/14/2024. CLINICAL HISTORY: Hemorrhagic stroke. FINDINGS: BRAIN AND VENTRICLES: A right frontoparietal parenchymal hematoma in the medial perirolandic region has not significantly changed from today's earlier CT, measuring approximately 3.3 x 1.6 x 2.1 cm. There is mild surrounding edema without significant mass effect. Small volume adjacent subarachnoid hemorrhage is again noted. There is no associated abnormal enhancement to indicate an underlying mass. Numerous chronic microhemorrhages are present predominantly peripherally throughout both cerebral hemispheres, with only minimal involvement of the deep gray nuclei and cerebellum. A 1.4 cm region of cortical and subcortical T2 hyperintensity laterally in the right temporal lobe is without abnormal enhancement or restricted diffusion. Patchy T2 hyperintensities in the cerebral white matter bilaterally are nonspecific but compatible with mild to moderate chronic small vessel ischemic disease. Chronic lacunar infarcts are noted in the cerebral white matter bilaterally as well as in the right thalamus. No acute infarct, midline shift, hydrocephalus, or extra-axial fluid collection is identified. There is mild cerebral atrophy. Major intracranial vascular flow voids are preserved. ORBITS: No acute abnormality. SINUSES: No acute abnormality. BONES AND SOFT TISSUES: Normal bone marrow signal and enhancement. 4 cm right frontal scalp lipoma. IMPRESSION: 1. Unchanged right frontoparietal parenchymal hematoma with mild surrounding edema and small volume adjacent subarachnoid hemorrhage. No abnormal enhancement to indicate an underlying mass. 2. 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 3. Mild to moderate chronic small vessel ischemic disease. 4. Small focus of cortical and  subcortical T2 hyperintensity/edema in the posterior right temporal lobe without enhancement, indeterminate though could reflect a subacute infarct.  Electronically signed by: Dasie Hamburg MD 02/14/2024 03:15 PM EST RP Workstation: [id number]    Vitals:   02/15/24 0545 02/15/24 0600 02/15/24 0615 02/15/24 0700  BP: (!) 155/66 (!) 143/59 (!) 154/66 (!) 146/58  Pulse: 88 80 85 91  Resp: 20 (!) 27 (!) 25 (!) 25  Temp:      TempSrc:      SpO2: 97% 93% 96% 95%  Weight:      Height:         PHYSICAL EXAM General:  Alert, well-nourished, well-developed patient in no acute distress Psych:  Mood and affect appropriate for situation CV: Regular rate and rhythm on monitor Respiratory:  Regular, unlabored respirations on room air GI: Abdomen soft and nontender   NEURO:  Mental Status: AA&Ox3, patient is able to give clear and coherent history Speech/Language: speech is without dysarthria or aphasia.  Naming, repetition, fluency, and comprehension intact.  Cranial Nerves:  II: PERRL. Visual fields full.  III, IV, VI: EOMI. Eyelids elevate symmetrically.  V: Sensation is intact to light touch and symmetrical to face.  VII: Left facial droop VIII: hearing intact to voice. IX, X: Palate elevates symmetrically. Phonation is normal.  KP:Dynloizm shrug 5/5. XII: tongue is midline without fasciculations. Motor:  LUE 3/5, hand grasp weak LLE mild drift 4/5 Tone: is normal and bulk is normal Sensation- Intact to light touch bilaterally. Extinction absent to light touch to DSS.   Coordination: FTN no ataxia out of proportion to weakness, HKS: no ataxia in BLE.No drift.  Gait- deferred   ASSESSMENT/PLAN  Mr. Tobby Fawcett is a 73 y.o. male with history of HTN, HLD, glaucoma, tobacco use admitted for left sided weakness, left facial droop, slurred speech.  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  CTA head & neck Acute right perirolandic intracerebral hemorrhage without CTA spot sign  MRI right frontoparietal parenchymal hematoma with mild surrounding edema and small volume adjacent subarachnoid hemorrhage 2D Echo Pending  LDL 97 HgbA1c 4.9 VTE prophylaxis - SCDs aspirin  81 mg daily prior to admission, now on No antithrombotic  Therapy recommendations:  Pending Disposition:  pending   Hypertension Home meds:  metoprolol  succinate, aldactone  (not taking aldactone ) Stable Blood pressure goal less 160 Cleviprex  infusing PRN hydralazine  and labetaolol Resume metoprolol , add norvasc   Hyperlipidemia Home meds:  Simvastatin  20mg , resumed in hospital LDL 97, goal < 70 Continue statin at discharge  Diabetes type II Controlled HgbA1c 4.9, goal < 7.0 CBGs SSI Recommend close follow-up with PCP for better DM control  Dysphagia Patient has post-stroke dysphagia, SLP consulted    Diet   Diet heart healthy/carb modified Room service appropriate? Yes with Assist; Fluid consistency: Thin   Advance diet as tolerated  Other Stroke Risk Factors ETOH use, alcohol level <15, advised to drink no more than 2 drink(s) a day  Glaucoma Home drops resumed  Thiamine deficiency No previous labs On supplement at home Level ordered Family notes a progressive cognitive decline/memory issues consistent with the micro hemorrhages seen on MRI   Hospital day # 1  Patient seen and examined by NP/APP with MD. MD to update note as needed.  Jorene Last, DNP, FNP-BC [facility] Pager: [phone]  I have personally obtained history,examined this patient, reviewed notes, independently viewed imaging studies, participated in medical decision making and plan of care.ROS completed by me personally and pertinent positives fully documented  I have made any additions or clarifications directly to the above note. Agree with note  above.  Patient presented with 2-day history of left-sided weakness and numbness and CT scan and MRI showed right parietal parenchymal hemorrhage with subarachnoid extension with multiple microhemorrhages on SWI images raising suspicion for cerebral amyloid angiopathy.  Continue strict blood pressure control with systolic goal between 130-150 for the first 24 hours and then below 160.  Use as needed IV labetalol  and hydralazine  and resume oral medications and wean off Cleviprex  drip as tolerated.  Physical occupational speech therapy consults.  Long discussion with patient's son over the phone and wife at the bedside and answered questions. This patient is critically ill and at significant risk of neurological worsening, death and care requires constant monitoring of vital signs, hemodynamics,respiratory and cardiac monitoring, extensive review of multiple databases, frequent neurological assessment, discussion with family, other specialists and medical decision making of high complexity.I have made any additions or clarifications directly to the above note.This critical care time does not reflect procedure time, or teaching time or supervisory time of PA/NP/Med Resident etc but could involve care discussion time.  I spent 30 minutes of neurocritical care time  in the care of  this patient.     Eather Popp, MD Medical Director [facility] Pager: [phone] 02/15/2024 1:10 PM  To contact Stroke Continuity provider, please refer to [email or web address]. After hours, contact General Neurology

## 2024-02-15 NOTE — Progress Notes (Signed)
 Inpatient Rehab Admissions Coordinator Note:   Per PT patient was screened for CIR candidacy by Berlynn Warsame SHAUNNA Yvone Cohens, CCC-SLP. At this time, pt appears to be a potential candidate for CIR. I will place an order for rehab consult for full assessment, per our protocol.  Please contact me any with questions.SABRA Tinnie Yvone Cohens, MS, CCC-SLP Admissions Coordinator [phone] 02/15/2024 4:12 PM

## 2024-02-16 ENCOUNTER — Inpatient Hospital Stay (HOSPITAL_COMMUNITY)

## 2024-02-16 DIAGNOSIS — I619 Nontraumatic intracerebral hemorrhage, unspecified: Secondary | ICD-10-CM

## 2024-02-16 DIAGNOSIS — I161 Hypertensive emergency: Secondary | ICD-10-CM

## 2024-02-16 LAB — CBC
HCT: 46.9 % (ref 39.0–52.0)
Hemoglobin: 16.2 g/dL (ref 13.0–17.0)
MCH: 29.9 pg (ref 26.0–34.0)
MCHC: 34.5 g/dL (ref 30.0–36.0)
MCV: 86.5 fL (ref 80.0–100.0)
Platelets: 150 K/uL (ref 150–400)
RBC: 5.42 MIL/uL (ref 4.22–5.81)
RDW: 14.4 % (ref 11.5–15.5)
WBC: 7.2 K/uL (ref 4.0–10.5)
nRBC: 0 % (ref 0.0–0.2)

## 2024-02-16 LAB — ECHOCARDIOGRAM COMPLETE
AR max vel: 2.98 cm2
AV Area VTI: 3.5 cm2
AV Area mean vel: 2.86 cm2
AV Mean grad: 4 mmHg
AV Peak grad: 7.2 mmHg
Ao pk vel: 1.34 m/s
Area-P 1/2: 2.56 cm2
Calc EF: 56.5 %
Height: 74 in
S' Lateral: 3.3 cm
Single Plane A2C EF: 61.3 %
Single Plane A4C EF: 56.3 %
Weight: 2998.26 [oz_av]

## 2024-02-16 LAB — BASIC METABOLIC PANEL WITH GFR
Anion gap: 12 (ref 5–15)
BUN: 8 mg/dL (ref 8–23)
CO2: 20 mmol/L — ABNORMAL LOW (ref 22–32)
Calcium: 9 mg/dL (ref 8.9–10.3)
Chloride: 105 mmol/L (ref 98–111)
Creatinine, Ser: 0.74 mg/dL (ref 0.61–1.24)
GFR, Estimated: >60 mL/min (ref >60)
Glucose, Bld: 132 mg/dL — ABNORMAL HIGH (ref 70–99)
Potassium: 3.3 mmol/L — ABNORMAL LOW (ref 3.5–5.1)
Sodium: 137 mmol/L (ref 135–145)

## 2024-02-16 MED ORDER — ORAL CARE MOUTH RINSE: 15.0000 mL | OROMUCOSAL | Status: DC | PRN

## 2024-02-16 MED ORDER — POTASSIUM CHLORIDE CRYS ER 20 MEQ PO TBCR
40.0000 meq | EXTENDED_RELEASE_TABLET | Freq: Two times a day (BID) | ORAL | Status: AC
  Administered 2024-02-16 (×2): 40 meq via ORAL
  Filled 2024-02-16 (×2): qty 2

## 2024-02-16 MED ORDER — LORAZEPAM 2 MG/ML IJ SOLN
2.0000 mg | INTRAMUSCULAR | Status: DC | PRN
  Administered 2024-02-18: 18:00:00 2 mg via INTRAVENOUS
  Filled 2024-02-16: qty 1

## 2024-02-16 MED ORDER — ENOXAPARIN SODIUM 40 MG/0.4ML IJ SOSY
40.0000 mg | PREFILLED_SYRINGE | INTRAMUSCULAR | Status: DC
  Administered 2024-02-16 – 2024-02-24 (×9): 40 mg via SUBCUTANEOUS
  Filled 2024-02-16 (×9): qty 0.4

## 2024-02-16 MED ORDER — AMLODIPINE BESYLATE 5 MG PO TABS
10.0000 mg | ORAL_TABLET | Freq: Every day | ORAL | Status: DC
  Administered 2024-02-16 – 2024-02-24 (×9): 10 mg via ORAL
  Filled 2024-02-16: qty 1
  Filled 2024-02-16 (×3): qty 2
  Filled 2024-02-16 (×3): qty 1
  Filled 2024-02-16 (×2): qty 2

## 2024-02-16 NOTE — TOC Progression Note (Addendum)
 Transition of Care [facility]) - Progression Note    Patient Details  Name: Nathan Stafford MRN: [id number] Date of Birth: [birth year]  [facility] CM/SW Contact  Kerstyn Coryell E Hannah Crill, LCSW Phone Number: 02/16/2024, 4:11 PM  Clinical Narrative:    CSW notified that patient and sister have questions about Medicaid status/eligibility. CSW sent request for Financial Navigator to follow up.  4:20- Clarified that family is asking about plans post DC. Noted CIR rec and they are currently assessing for eligibility. Will continue to follow.                     Expected Discharge Plan and Services                                               Social Drivers of Health (SDOH) Interventions SDOH Screenings   Food Insecurity: Food Insecurity Present (02/16/2024)  Housing: Low Risk (02/16/2024)  Transportation Needs: No Transportation Needs (02/16/2024)  Utilities: Not At Risk (02/16/2024)  Alcohol Screen: Low Risk (07/30/2023)  Depression (PHQ2-9): Low Risk (07/30/2023)  Financial Resource Strain: Medium Risk (07/30/2023)  Physical Activity: Sufficiently Active (07/30/2023)  Social Connections: Socially Isolated (02/16/2024)  Stress: No Stress Concern Present (07/30/2023)  Tobacco Use: High Risk (02/14/2024)  Health Literacy: Adequate Health Literacy (07/30/2023)    Readmission Risk Interventions     No data to display

## 2024-02-16 NOTE — Progress Notes (Addendum)
 STROKE TEAM PROGRESS NOTE   INTERIM HISTORY/SUBJECTIVE Patient was agitated last night and probably sundowning and was started on as needed Ativan .  Blood pressure adequately controlled now off Cleviprex  drip. Pt alert but unsure why he was admitted to the hospital. Still presenting with L sided weakness and neglect. No family at bedside. Most recent CIWA score of 10, Ativan  given. Pt was not agitated during exam.  OBJECTIVE  CBC    Component Value Date/Time   WBC 7.2 02/16/2024 0536   RBC 5.42 02/16/2024 0536   HGB 16.2 02/16/2024 0536   HGB 14.9 05/09/2017 1011   HCT 46.9 02/16/2024 0536   HCT 44.5 05/09/2017 1011   PLT 150 02/16/2024 0536   PLT 173 05/09/2017 1011   MCV 86.5 02/16/2024 0536   MCV 87 05/09/2017 1011   MCH 29.9 02/16/2024 0536   MCHC 34.5 02/16/2024 0536   RDW 14.4 02/16/2024 0536   RDW 15.7 (H) 05/09/2017 1011   LYMPHSABS 1.6 02/14/2024 0751   MONOABS 0.8 02/14/2024 0751   EOSABS 0.0 02/14/2024 0751   BASOSABS 0.0 02/14/2024 0751    BMET    Component Value Date/Time   NA 137 02/16/2024 0536   NA 142 05/11/2020 0944   K 3.3 (L) 02/16/2024 0536   CL 105 02/16/2024 0536   CO2 20 (L) 02/16/2024 0536   GLUCOSE 132 (H) 02/16/2024 0536   BUN 8 02/16/2024 0536   BUN 9 05/11/2020 0944   CREATININE 0.74 02/16/2024 0536   CREATININE 1.03 04/26/2015 1440   CALCIUM  9.0 02/16/2024 0536   EGFR 91 05/11/2020 0944   GFRNONAA >60 02/16/2024 0536   GFRNONAA 76 04/26/2015 1440    IMAGING past 24 hours ECHOCARDIOGRAM COMPLETE Result Date: 02/16/2024    ECHOCARDIOGRAM REPORT   Patient Name:   Nathan Stafford Date of Exam: 02/16/2024 Medical Rec #:  [id number]    Height:       74.0 in Accession #:    [id number]   Weight:       187.4 lb Date of Birth:  10/17/1950     BSA:          2.114 m Patient Age:    73 years     BP:           137/84 mmHg Patient Gender: M            HR:           66 bpm. Exam Location:  Inpatient Procedure: 2D Echo, Cardiac Doppler and Color Doppler  (Both Spectral and Color            Flow Doppler were utilized during procedure). Indications:    Stroke I63.9  History:        Patient has prior history of Echocardiogram examinations, most                 recent 08/21/2023. Signs/Symptoms:Hypertensive Heart Disease.  Sonographer:    Nathanel Devonshire Referring Phys: [id number] ASHISH ARORA IMPRESSIONS  1. Left ventricular ejection fraction, by estimation, is 55 to 60%. Left ventricular ejection fraction by 2D MOD biplane is 56.5 %. The left ventricle has normal function. The left ventricle has no regional wall motion abnormalities. Left ventricular diastolic parameters were normal.  2. Right ventricular systolic function is normal. The right ventricular size is normal. There is normal pulmonary artery systolic pressure.  3. The mitral valve is normal in structure. No evidence of mitral valve regurgitation. No evidence of mitral stenosis.  4. The aortic  valve is normal in structure. Aortic valve regurgitation is not visualized. No aortic stenosis is present.  5. The inferior vena cava is normal in size with greater than 50% respiratory variability, suggesting right atrial pressure of 3 mmHg. Comparison(s): No prior Echocardiogram. FINDINGS  Left Ventricle: Left ventricular ejection fraction, by estimation, is 55 to 60%. Left ventricular ejection fraction by 2D MOD biplane is 56.5 %. The left ventricle has normal function. The left ventricle has no regional wall motion abnormalities. The left ventricular internal cavity size was normal in size. There is no left ventricular hypertrophy. Left ventricular diastolic parameters were normal. Right Ventricle: The right ventricular size is normal. No increase in right ventricular wall thickness. Right ventricular systolic function is normal. There is normal pulmonary artery systolic pressure. Left Atrium: Left atrial size was normal in size. Right Atrium: Right atrial size was normal in size. Pericardium: There is no evidence of  pericardial effusion. Mitral Valve: The mitral valve is normal in structure. No evidence of mitral valve regurgitation. No evidence of mitral valve stenosis. Tricuspid Valve: The tricuspid valve is normal in structure. Tricuspid valve regurgitation is not demonstrated. No evidence of tricuspid stenosis. Aortic Valve: The aortic valve is normal in structure. Aortic valve regurgitation is not visualized. No aortic stenosis is present. Aortic valve mean gradient measures 4.0 mmHg. Aortic valve peak gradient measures 7.2 mmHg. Aortic valve area, by VTI measures 3.50 cm. Pulmonic Valve: The pulmonic valve was normal in structure. Pulmonic valve regurgitation is not visualized. No evidence of pulmonic stenosis. Aorta: The aortic root is normal in size and structure. Venous: The inferior vena cava is normal in size with greater than 50% respiratory variability, suggesting right atrial pressure of 3 mmHg. IAS/Shunts: No atrial level shunt detected by color flow Doppler.  LEFT VENTRICLE PLAX 2D                        Biplane EF (MOD) LVIDd:         5.10 cm         LV Biplane EF:   Left LVIDs:         3.30 cm                          ventricular LV PW:         1.10 cm                          ejection LV IVS:        1.00 cm                          fraction by LVOT diam:     2.30 cm                          2D MOD LV SV:         92                               biplane is LV SV Index:   43                               56.5 %. LVOT Area:     4.15 cm LV IVRT:  88 msec         Diastology                                LV e' medial:    2.83 cm/s                                LV E/e' medial:  13.0 LV Volumes (MOD)               LV e' lateral:   4.13 cm/s LV vol d, MOD    77.8 ml       LV E/e' lateral: 8.9 A2C: LV vol d, MOD    104.0 ml A4C: LV vol s, MOD    30.1 ml A2C: LV vol s, MOD    45.5 ml A4C: LV SV MOD A2C:   47.7 ml LV SV MOD A4C:   104.0 ml LV SV MOD BP:    51.4 ml RIGHT VENTRICLE             IVC RV Basal diam:   4.10 cm     IVC diam: 1.60 cm RV S prime:     11.20 cm/s TAPSE (M-mode): 1.8 cm      PULMONARY VEINS                             Diastolic Velocity: 39.40 cm/s                             S/D Velocity:       0.90                             Systolic Velocity:  36.40 cm/s LEFT ATRIUM             Index        RIGHT ATRIUM           Index LA diam:        3.10 cm 1.47 cm/m   RA Area:     13.80 cm LA Vol (A2C):   37.0 ml 17.50 ml/m  RA Volume:   37.20 ml  17.60 ml/m LA Vol (A4C):   34.9 ml 16.51 ml/m LA Biplane Vol: 38.3 ml 18.12 ml/m  AORTIC VALVE AV Area (Vmax):    2.98 cm AV Area (Vmean):   2.86 cm AV Area (VTI):     3.50 cm AV Vmax:           134.00 cm/s AV Vmean:          87.900 cm/s AV VTI:            0.262 m AV Peak Grad:      7.2 mmHg AV Mean Grad:      4.0 mmHg LVOT Vmax:         96.00 cm/s LVOT Vmean:        60.600 cm/s LVOT VTI:          0.221 m LVOT/AV VTI ratio: 0.84  AORTA Ao Root diam: 3.90 cm Ao Asc diam:  3.00 cm MITRAL VALVE MV Area (PHT): 2.56 cm     SHUNTS MV Decel Time: 296 msec     Systemic VTI:  0.22 m MV E  velocity: 36.80 cm/s   Systemic Diam: 2.30 cm MV A velocity: 102.00 cm/s MV E/A ratio:  0.36 Franck Azobou Tonleu Electronically signed by Joelle Cedars Tonleu Signature Date/Time: 02/16/2024/9:26:55 AM    Final     Vitals:   02/16/24 0600 02/16/24 0700 02/16/24 0800 02/16/24 0918  BP: (!) 151/80 (!) 157/59 137/65 139/62  Pulse:  72 69 74  Resp: (!) 25 (!) 23 17   Temp:   97.7 F (36.5 C)   TempSrc:   Oral   SpO2: 95% 96% 97%   Weight:      Height:         PHYSICAL EXAM General:  Alert, well-nourished, well-developed patient in no acute distress Psych:  Mood and affect appropriate for situation CV: Regular rate and rhythm on monitor Respiratory:  Regular, unlabored respirations on room air GI: Abdomen soft and nontender   NEURO:  Mental Status: AA&Ox3, patient is unsure why he is in the hospital.  Speech/Language: speech is without dysarthria or aphasia.   Naming, repetition, fluency, and comprehension intact.  Cranial Nerves:  II: PERRL. Visual fields full.  III, IV, VI: EOMI. Eyelids elevate symmetrically.  V: Sensation is intact to light touch and symmetrical to face.  VII: Left facial droop VIII: hearing intact to voice. IX, X: Palate elevates symmetrically. Phonation is normal.  KP:Dynloizm shrug 5/5. XII: tongue is midline without fasciculations. Motor:  LUE 3/5, hand grasp weak LLE mild drift 4/5 Tone: is normal and bulk is normal Sensation- Intact to light touch bilaterally.  Mild left Hemi inattention and extinction to light touch to DSS.   Coordination: FTN no ataxia out of proportion to weakness, HKS: no ataxia in BLE.No drift.  Gait- deferred   ASSESSMENT/PLAN  [name] is a 73 y.o. male with history of HTN, HLD, glaucoma, tobacco use admitted for left sided weakness, left facial droop, slurred speech.  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  CTA head & neck Acute right perirolandic intracerebral hemorrhage without CTA spot sign  MRI right frontoparietal parenchymal hematoma with mild surrounding edema and small volume adjacent subarachnoid hemorrhage 2D Echo Pending  LDL 97 HgbA1c 4.9 VTE prophylaxis - SCDs aspirin  81 mg daily prior to admission, now on No antithrombotic  Therapy recommendations: PT: SNF   OT: Pending Disposition:  pending   Hypertension Home meds:  metoprolol  succinate, aldactone  (not taking aldactone ) Stable Blood pressure goal less 160 Cleviprex  infusing PRN hydralazine  and labetaolol Resume metoprolol , add norvasc   Hyperlipidemia Home meds:  Simvastatin  20mg , resumed in hospital LDL 97, goal <  70 Continue statin at discharge  Diabetes type II Controlled HgbA1c 4.9, goal < 7.0 CBGs SSI Recommend close follow-up with PCP for better DM control  Dysphagia Patient has post-stroke dysphagia, SLP consulted    Diet   Diet heart healthy/carb modified Room service appropriate? Yes with Assist; Fluid consistency: Thin   Advance diet as tolerated  Other Stroke Risk Factors ETOH use, alcohol level <15, advised to drink no more than 2 drink(s) a day  Alcohol use Monitoring CIWA, can spot dose Ativan  as needed. If pt becomes agitated, would prefer Depakote 500-1000mg  over a more sedating medication.  Glaucoma Home drops resumed  Thiamine deficiency No previous labs On supplement at home  Level ordered Family notes a progressive cognitive decline/memory issues consistent with the micro hemorrhages seen on MRI   Hospital day # 2  Pt is stable from a neurologic perspective can be   D. Penne Mori, DO, PGY1 Neurology Stroke Team 02/16/2024 10:03 AM  I have personally obtained history,examined this patient, reviewed notes, independently viewed imaging studies, participated in medical decision making and plan of care.ROS completed by me personally and pertinent positives fully documented  I have made any additions or clarifications directly to the above note. Agree with note above.  Patient's blood pressure is adequately controlled off Cleviprex  drip.  Keep systolic goal below 160.  Increase p.o. medications and use as needed IV labetalol  and hydralazine .  Transfer out of ICU to neurology floor bed.  Will ask medical hospitalist team to resume care when patient goes to the floor.  Continue to use Ativan  as needed for agitation.  No family at the bedside.   I personally spent a total of 50 minutes in the care of the patient today including getting/reviewing separately obtained history, performing a medically appropriate exam/evaluation, counseling and educating, placing orders,  referring and communicating with other health care professionals, documenting clinical information in the EHR, independently interpreting results, and coordinating care.         Eather Popp, MD Medical Director [facility] Pager: [phone] 02/16/2024 12:59 PM  To contact Stroke Continuity provider, please refer to [email or web address]. After hours, contact General Neurology

## 2024-02-16 NOTE — Evaluation (Signed)
 Speech Language Pathology Evaluation Patient Details Name: Nathan Stafford MRN: [id number] DOB: 07/23/1950 Today's Date: 02/16/2024 Time: 1045-1100 SLP Time Calculation (min) (ACUTE ONLY): 15 min  Problem List:  Patient Active Problem List   Diagnosis Date Noted   Hemorrhagic stroke (HCC) 02/14/2024   Alcoholic hepatitis without ascites (HCC) 08/21/2023   Dyslipidemia, goal LDL below 70 11/12/2022   Need for prophylactic vaccination and inoculation against varicella 11/12/2022   Diuretic-induced hypokalemia 11/14/2021   Benign prostatic hyperplasia without lower urinary tract symptoms 11/13/2020   Encounter for general adult medical examination with abnormal findings 11/13/2020   Prediabetes 05/11/2020   Hypertensive heart disease with chronic combined systolic and diastolic congestive heart failure (HCC) 05/11/2020   Erectile dysfunction 07/11/2016   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   GERD (gastroesophageal reflux disease)    Glaucoma    Hematuria    HLD (hyperlipidemia)    HTN (hypertension)    Perinephric hematoma    Past Surgical History:  Past Surgical History:  Procedure Laterality Date   colonscopy     EYE SURGERY Right 1993   LASIK     HPI:  73 yo M adm 12/13 sudden L side weakness. 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 PMH: HTN, HLD, glaucoma, tobacco abuse, cardiomyopathy, GERD, ETOH abuse   Assessment / Plan / Recommendation Clinical  Impression  Pt reports he generally has assistance with med management from his friend, Elouise. Mild L CN VII are noted but speech is fully intelligible at the sentence level. He presents with a flat affect but is showing awareness of L sided deficits and recent session wtih PT/OT. Administered the Cognistat, noting deficits related to memory, problem solving, and reasoning. He had difficulty both with storage and retrieval of four novel words, able to recall 2/4 when given multiple choices. He had difficulty with calculations, requiring prompting to identify tools he could use to benefit performance (paper and pen vs calculator). Verbal reasoning was also challenging, both with basic and more complex topics. Pt identified specific tasks that felt more challenging than normal and is eager to continue working with SLP to address acute deficits. Recommend intensive f/u, >3 hrs/day.    SLP Assessment  SLP Recommendation/Assessment: Patient needs continued Speech Language Pathology Services SLP Visit Diagnosis: Cognitive communication deficit (R41.841)     Assistance Recommended at Discharge  Frequent or constant Supervision/Assistance  Functional Status Assessment Patient has had a recent decline in their functional status and demonstrates the ability to make significant improvements in function in a reasonable and predictable amount of time.  Frequency and Duration min 2x/week  2 weeks      SLP Evaluation Cognition  Overall Cognitive Status: Impaired/Different from baseline Arousal/Alertness: Awake/alert Orientation Level: Oriented X4 Attention: Sustained Sustained Attention: Appears intact Memory: Impaired Memory Impairment: Storage deficit;Retrieval deficit Awareness: Impaired Awareness Impairment: Emergent impairment Problem Solving: Impaired Problem Solving Impairment: Verbal basic Executive Function: Reasoning Reasoning: Impaired Reasoning Impairment: Verbal basic        Comprehension  Auditory Comprehension Overall Auditory Comprehension: Appears  within functional limits for tasks assessed    Expression Expression Primary Mode of Expression: Verbal Verbal Expression Overall Verbal Expression: Appears within functional limits for tasks assessed   Oral / Motor  Oral Motor/Sensory Function Overall Oral Motor/Sensory Function: Mild impairment Facial ROM: Reduced left;Suspected CN VII (facial) dysfunction Facial Symmetry: Abnormal symmetry left;Suspected CN VII (facial) dysfunction Facial Strength: Within Functional Limits Facial Sensation: Within Functional Limits Lingual ROM: Within Functional Limits Lingual Symmetry: Within Functional Limits Lingual Strength: Within Functional Limits Lingual Sensation: Within Functional Limits Motor Speech Overall Motor Speech: Appears within functional limits for tasks assessed            Damien Blumenthal, M.A., CCC-SLP Speech Language Pathology, Acute Rehabilitation Services  Secure Chat preferred [phone]  02/16/2024, 11:39 AM

## 2024-02-16 NOTE — Progress Notes (Signed)
 Notified Dr. Merrianne of increased agitation and aggression towards staff.  Pt starting to argue and refuse treatment.  Overall neuro status/ NIH unchanged.  Per reports, pt's family stated pt is a heavy drinker.  Current CIWA 10.  Dr. Lindzen placed PRN orders for Ativan  IV.

## 2024-02-16 NOTE — Progress Notes (Signed)
 Inpatient Rehab Admissions:  Inpatient Rehab Consult received.  I met with patient at the bedside for rehabilitation assessment and to discuss goals and expectations of an inpatient rehab admission.  Discussed average length of stay, insurance authorization requirement and discharge home after completion of CIR. Pt acknowledged understanding and is interested in pursuing CIR. Pt gave permission to contact son Shirline. Spoke with [place] on the phone. He also acknowledged understanding of CIR goals and expectations. He would like pt to pursue CIR. However, he is unsure if pt will be able to have 24/7 support. He would like some time to see if he can arrange 24/7 support for pt. Will continue to follow.  Signed: Tinnie Yvone Cohens, MS, CCC-SLP Admissions Coordinator [phone]

## 2024-02-16 NOTE — Plan of Care (Signed)
°  Problem: Education: Goal: Knowledge of disease or condition will improve Outcome: Progressing Goal: Knowledge of secondary prevention will improve (MUST DOCUMENT ALL) Outcome: Progressing Goal: Knowledge of patient specific risk factors will improve (DELETE if not current risk factor) Outcome: Progressing   Problem: Intracerebral Hemorrhage Tissue Perfusion: Goal: Complications of Intracerebral Hemorrhage will be minimized Outcome: Progressing   Problem: Coping: Goal: Will verbalize positive feelings about self Outcome: Progressing Goal: Will identify appropriate support needs Outcome: Progressing   Problem: Health Behavior/Discharge Planning: Goal: Goals will be collaboratively established with patient/family Outcome: Progressing   Problem: Self-Care: Goal: Ability to communicate needs accurately will improve Outcome: Progressing   Problem: Nutrition: Goal: Risk of aspiration will decrease Outcome: Progressing Goal: Dietary intake will improve Outcome: Progressing

## 2024-02-16 NOTE — Progress Notes (Signed)
 Physical Therapy Treatment Patient Details Name: Nathan Stafford MRN: [id number] DOB: 1950-06-26 Today's Date: 02/16/2024   History of Present Illness 73 yo M adm 12/13 sudden L side weakness. 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 PMH: HTN, HLD, glaucoma, tobacco abuse, cardiomyopathy, GERD, ETOH abuse    PT Comments  Pt much improved both cognitively and functionally this date. Pt with improved L UE and LE strength, ability to maintain static sitting EOB balance, and began gait training with maxAX2 and tactile cues to advance L LE. Pt very motivated to return to independence and ultimately home. Pt to greatly benefit from aggressive inpatient rehab program > 3 hours a day to address above deficits and progress to safe level of function for safe transition home. Acute PT to cont to follow.    If plan is discharge home, recommend the following: A lot of help with walking and/or transfers;A lot of help with bathing/dressing/bathroom;Assist for transportation;Supervision due to cognitive status;Assistance with feeding;Assistance with cooking/housework;Direct supervision/assist for medications management;Direct supervision/assist for financial management   Can travel by private vehicle        Equipment Recommendations   (TBD at next venue)    Recommendations for Other Services Rehab consult     Precautions / Restrictions Precautions Precautions: Fall Recall of Precautions/Restrictions: Impaired Precaution/Restrictions Comments: L sided inattention Restrictions Weight Bearing Restrictions Per Provider Order: No     Mobility  Bed Mobility Overal bed mobility: Needs Assistance Bed Mobility: Supine to Sit     Supine to sit: Min assist     General bed mobility comments: exiting towards L side with min assist to guard and  elevate trunk fully    Transfers Overall transfer level: Needs assistance Equipment used: 2 person hand held assist Transfers: Sit to/from Stand, Bed to chair/wheelchair/BSC Sit to Stand: Mod assist, +2 physical assistance   Step pivot transfers: Mod assist, +2 physical assistance       General transfer comment: PT blocking L knee, cueing for technique to step towards R into recliner, step by step verbal and tactile cues to sequence stepping, unable to use L UE to support self    Ambulation/Gait Ambulation/Gait assistance: Max assist, +2 physical assistance (RN to push chair and manage lines) Gait Distance (Feet): 20 Feet Assistive device: 2 person hand held assist Gait Pattern/deviations: Step-to pattern Gait velocity: slow Gait velocity interpretation: <1.31 ft/sec, indicative of household ambulator   General Gait Details: tactile cues and modA to advance L LE and to stimulate quad for knee extension during stance phase. Pt with L lateral lean, OT cueing trunk for upright/midline posture while PT managed L LE. Pt with impoved ability to clear L foot with increased distance   Stairs             Wheelchair Mobility     Tilt Bed    Modified Rankin (Stroke Patients Only) Modified Rankin (Stroke Patients Only) Pre-Morbid Rankin Score: No symptoms Modified Rankin: Moderately severe disability     Balance Overall balance assessment: Needs assistance Sitting-balance support: Feet supported, Single extremity supported Sitting balance-Leahy Scale: Fair Sitting balance - Comments: L lateral lean but able to correct with cueing. dynamically needs min assist   Standing balance support: Bilateral upper extremity supported, During functional activity Standing balance-Leahy Scale: Poor Standing balance comment: max +2 dynamically  Communication Communication Communication: Impaired Factors Affecting Communication: Reduced clarity of  speech  Cognition Arousal: Alert Behavior During Therapy: Flat affect   PT - Cognitive impairments: Awareness, Memory, Attention, Initiation, Sequencing, Problem solving, Safety/Judgement                       PT - Cognition Comments: L side inattention, pt recognized therapist as a person but didn't recall therapy session, difficulty sequencing but good initiation Following commands: Impaired Following commands impaired: Follows one step commands with increased time, Follows multi-step commands inconsistently    Cueing Cueing Techniques: Verbal cues, Gestural cues, Tactile cues, Visual cues  Exercises      General Comments General comments (skin integrity, edema, etc.): VSS      Pertinent Vitals/Pain Pain Assessment Pain Assessment: No/denies pain    Home Living Family/patient expects to be discharged to:: Private residence Living Arrangements: Non-relatives/Friends Available Help at Discharge: Family Type of Home: Apartment Home Access: Level entry       Home Layout: One level Home Equipment: None      Prior Function            PT Goals (current goals can now be found in the care plan section) Acute Rehab PT Goals Patient Stated Goal: home Progress towards PT goals: Progressing toward goals    Frequency    Min 4X/week      PT Plan      Co-evaluation              AM-PAC PT 6 Clicks Mobility   Outcome Measure  Help needed turning from your back to your side while in a flat bed without using bedrails?: A Lot Help needed moving from lying on your back to sitting on the side of a flat bed without using bedrails?: A Lot Help needed moving to and from a bed to a chair (including a wheelchair)?: A Lot Help needed standing up from a chair using your arms (e.g., wheelchair or bedside chair)?: A Lot Help needed to walk in hospital room?: Total Help needed climbing 3-5 steps with a railing? : Total 6 Click Score: 10    End of Session  Equipment Utilized During Treatment: Gait belt Activity Tolerance: Patient limited by fatigue Patient left: in chair;with call bell/phone within reach;with chair alarm set;with nursing/sitter in room Nurse Communication: Mobility status PT Visit Diagnosis: Difficulty in walking, not elsewhere classified (R26.2);Hemiplegia and hemiparesis;Muscle weakness (generalized) (M62.81) Hemiplegia - Right/Left: Left Hemiplegia - dominant/non-dominant: Non-dominant Hemiplegia - caused by: Nontraumatic intracerebral hemorrhage     Time: 0947-1010 PT Time Calculation (min) (ACUTE ONLY): 23 min  Charges:    $Gait Training: 8-22 mins PT General Charges $$ ACUTE PT VISIT: 1 Visit                     Norene Ames, PT, DPT Acute Rehabilitation Services Secure chat preferred Office #: [phone]    Norene CHRISTELLA Ames 02/16/2024, 1:31 PM

## 2024-02-16 NOTE — Plan of Care (Signed)
°  Problem: Education: Goal: Knowledge of disease or condition will improve Outcome: Progressing Goal: Knowledge of secondary prevention will improve (MUST DOCUMENT ALL) Outcome: Progressing Goal: Knowledge of patient specific risk factors will improve (DELETE if not current risk factor) Outcome: Progressing   Problem: Intracerebral Hemorrhage Tissue Perfusion: Goal: Complications of Intracerebral Hemorrhage will be minimized Outcome: Progressing   Problem: Coping: Goal: Will verbalize positive feelings about self Outcome: Progressing Goal: Will identify appropriate support needs Outcome: Progressing   Problem: Health Behavior/Discharge Planning: Goal: Goals will be collaboratively established with patient/family Outcome: Progressing

## 2024-02-16 NOTE — Evaluation (Signed)
 Occupational Therapy Evaluation Patient Details Name: Nathan Stafford MRN: [id number] DOB: Jul 21, 1950 Today's Date: 02/16/2024   History of Present Illness   73 yo M adm 12/13 sudden L side weakness. 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 PMH: HTN, HLD, glaucoma, tobacco abuse, cardiomyopathy, GERD, ETOH abuse     Clinical Impressions PTA patient independent and working part time. Admitted for above and presents with problem list below, including L sided weakness and decreased coordination, impaired balance, impaired problem solving,  L inattention.  He currently requires mod assist +2 for transfers and max assist +2 for functional mobility, min to max assist for ADLs.  He demonstrates L lateral lean in sitting and standing, decreased awareness to L side but easily redirected.  Highly motivated and hopeful he can progress quickly with intensive rehab.  Based on performance today, believe pt will best benefit from continued OT services acutely and after dc at an inpatient setting with >3hrs/day to optimize independence, safety and return to PLOF with ADLs and mobility.       If plan is discharge home, recommend the following:   Two people to help with walking and/or transfers;Two people to help with bathing/dressing/bathroom;Assistance with cooking/housework;Assist for transportation;Help with stairs or ramp for entrance;Direct supervision/assist for financial management;Direct supervision/assist for medications management     Functional Status Assessment   Patient has had a recent decline in their functional status and demonstrates the ability to make significant improvements in function in a reasonable and predictable amount of time.     Equipment Recommendations   Other (comment) (defer)     Recommendations for Other  Services   Rehab consult     Precautions/Restrictions   Precautions Precautions: Fall Recall of Precautions/Restrictions: Impaired Precaution/Restrictions Comments: L sided neglect Restrictions Weight Bearing Restrictions Per Provider Order: No     Mobility Bed Mobility Overal bed mobility: Needs Assistance Bed Mobility: Supine to Sit     Supine to sit: Min assist     General bed mobility comments: exiting towards L side with min assist to guard and elevate trunk fully    Transfers Overall transfer level: Needs assistance Equipment used: 2 person hand held assist Transfers: Sit to/from Stand, Bed to chair/wheelchair/BSC Sit to Stand: Mod assist, +2 physical assistance     Step pivot transfers: Mod assist, +2 physical assistance     General transfer comment: PT blocking L knee, cueing for technique to step towards R into recliner      Balance Overall balance assessment: Needs assistance Sitting-balance support: Feet supported, Single extremity supported Sitting balance-Leahy Scale: Fair Sitting balance - Comments: L lateral lean but able to correct with cueing. dynamically needs min assist   Standing balance support: Bilateral upper extremity supported, During functional activity Standing balance-Leahy Scale: Poor Standing balance comment: max +2 dynamically                           ADL either performed or assessed with clinical judgement   ADL Overall ADL's : Needs assistance/impaired     Grooming: Minimal assistance;Sitting           Upper Body Dressing : Sitting;Minimal assistance   Lower Body Dressing: Maximal assistance;+2 for physical assistance;Sit to/from stand   Toilet Transfer: Moderate assistance;+2 for physical assistance;Stand-pivot Toilet Transfer Details (indicate cue type and reason): to recliner, towards  R         Functional mobility during ADLs: Maximal assistance;+2 for physical assistance;Rolling walker (2  wheels)       Vision Baseline Vision/History: 3 Glaucoma (R eye) Patient Visual Report: No change from baseline Vision Assessment?: No apparent visual deficits Additional Comments: appears functional, wears reading glasses     Perception         Praxis         Pertinent Vitals/Pain Pain Assessment Pain Assessment: No/denies pain     Extremity/Trunk Assessment Upper Extremity Assessment Upper Extremity Assessment: LUE deficits/detail;Right hand dominant LUE Deficits / Details: grossly 3-/5 MMT, more difficulty with [facility] of wrist and hand.  poor coordination and sensation.  able to bring to face for adls LUE Sensation: decreased light touch;decreased proprioception LUE Coordination: decreased fine motor;decreased gross motor   Lower Extremity Assessment Lower Extremity Assessment: Defer to PT evaluation   Cervical / Trunk Assessment Cervical / Trunk Assessment: Normal   Communication Communication Communication: Impaired Factors Affecting Communication: Reduced clarity of speech   Cognition Arousal: Alert Behavior During Therapy: Flat affect Cognition: Cognition impaired     Awareness: Intellectual awareness intact, Online awareness impaired Memory impairment (select all impairments): Working memory Attention impairment (select first level of impairment): Sustained attention Executive functioning impairment (select all impairments): Sequencing, Reasoning, Initiation, Organization, Problem solving OT - Cognition Comments: pt oriented, requires cueing for exact date but able to recall with cueing.  demonstrates some decreased awareness to deficits and safety, slighlty impulsive but easily redirectable. decreased problem solving and sequencing                 Following commands: Impaired Following commands impaired: Follows one step commands with increased time, Follows multi-step commands inconsistently     Cueing  General Comments   Cueing Techniques:  Verbal cues;Gestural cues;Tactile cues;Visual cues  VSS   Exercises     Shoulder Instructions      Home Living Family/patient expects to be discharged to:: Private residence Living Arrangements: Non-relatives/Friends Available Help at Discharge: Family Type of Home: Apartment Home Access: Level entry     Home Layout: One level     Bathroom Shower/Tub: Chief Strategy Officer: Standard     Home Equipment: None      Lives With: Friend(s)    Prior Functioning/Environment Prior Level of Function : Independent/Modified Independent;Working/employed;Driving             Mobility Comments: no AD ADLs Comments: indep, 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 Problem List: Decreased strength;Decreased activity tolerance;Impaired balance (sitting and/or standing);Decreased coordination;Decreased cognition;Decreased safety awareness;Decreased knowledge of use of DME or AE;Decreased knowledge of precautions;Impaired UE functional use;Impaired sensation   OT Treatment/Interventions: Self-care/ADL training;Therapeutic exercise;DME and/or AE instruction;Therapeutic activities;Patient/family education;Balance training;Neuromuscular education;Visual/perceptual remediation/compensation;Cognitive remediation/compensation      OT Goals(Current goals can be found in the care plan section)   Acute Rehab OT Goals Patient Stated Goal: get better OT Goal Formulation: With patient Time For Goal Achievement: 03/01/24 Potential to Achieve Goals: Good   OT Frequency:  Min 2X/week    Co-evaluation              AM-PAC OT 6 Clicks Daily Activity     Outcome Measure Help from another person eating meals?: A Little Help from another person taking care of personal grooming?: A Little Help from another person toileting, which includes using toliet, bedpan, or urinal?: Total Help from another person bathing (including washing, rinsing, drying)?: A Lot Help  from another person to put  on and taking off regular upper body clothing?: A Little Help from another person to put on and taking off regular lower body clothing?: A Lot 6 Click Score: 14   End of Session Equipment Utilized During Treatment: Gait belt Nurse Communication: Mobility status;Precautions  Activity Tolerance: Patient tolerated treatment well Patient left: in chair;with call bell/phone within reach;with chair alarm set;with nursing/sitter in room  OT Visit Diagnosis: Other abnormalities of gait and mobility (R26.89);Hemiplegia and hemiparesis;Other symptoms and signs involving the nervous system (R29.898) Hemiplegia - Right/Left: Left Hemiplegia - dominant/non-dominant: Non-Dominant                Time: 9058-8989 OT Time Calculation (min): 29 min Charges:  OT General Charges $OT Visit: 1 Visit OT Evaluation $OT Eval Moderate Complexity: 1 Mod  Etta NOVAK, OT Acute Rehabilitation Services Office [phone] Secure Chat Preferred    Etta GORMAN Hope 02/16/2024, 12:54 PM

## 2024-02-16 NOTE — TOC CM/SW Note (Signed)
 Transition of Care [facility]) - Inpatient Brief Assessment   Patient Details  Name: Nathan Stafford MRN: [id number] Date of Birth: 1950/03/18  [facility]) CM/SW Contact:    Duayne Brideau M, RN Phone Number: 02/16/2024, 3:56 PM   Clinical Narrative: 73 yo M adm 12/13 sudden L side weakness. 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  PT/OT recommending CIR and consult in progress; son working on needed support at discharge.    Transition of Care Asessment: Insurance and Status: Insurance coverage has been reviewed Patient has primary care physician: Yes Cathy Molt) Home environment has been reviewed: Lives in an apartment Prior level of function:: Independent, working part time Forensic Psychologist: No current home services Social Drivers of Health Review: SDOH reviewed no interventions necessary Readmission risk has been reviewed: Yes Transition of care needs: no transition of care needs at this time  Mliss MICAEL Fass, RN, BSN  Trauma/Neuro ICU Case Manager [phone]

## 2024-02-16 NOTE — PMR Pre-admission (Shared)
 PMR Admission Coordinator Pre-Admission Assessment  Patient: Nathan Stafford is an 73 y.o., male MRN: [id number] DOB: [birth year] Height: 6' 2 (188 cm) Weight: 85 kg  Insurance Information HMO: ***    PPO: ***     PCP:      IPA:      80/20:      OTHER:  PRIMARY: [employer]      Policy#: [id number]      Subscriber: patient CM Name: ***      Phone#: ***     Fax#: *** Pre-Cert#: ***      Employer: *** Benefits:  Phone #: ***     Name: *** Eustacio. Date: ***     Deduct: ***      Out of Pocket Max: ***      Life Max: *** CIR: ***      SNF: *** Outpatient: ***     Co-Pay: *** Home Health: ***      Co-Pay: *** DME: ***     Co-Pay: *** Providers: in-network SECONDARY:       Policy#:      Phone#:   Financial Counselor:       Phone#:   [employer] for patients in Inpatient Rehabilitation Facilities with attached Privacy Act Statement-Health Care Records was provided and verbally reviewed with: {CHL IP Patient Family WJ:[id number]}  Emergency Contact Information Contact Information     Name Relation Home Work Mobile   [place] Son   [phone]   Caleen Carne Alyse   [phone]   Neysa Jerel Alyse   [phone]      Other Contacts   None on File     Current Medical History  Patient Admitting Diagnosis: hemorrhagic stroke History of Present Illness: Pt is a  73 year old male with  medical hx significant for: HTN, hyperlipidemia, glaucoma, tobacco abuse. Pt presented to [facility] on 02/14/24 d/t  sudden onset of left-sided weakness. Code stroke activated. CT showed right proximal frontal ICH with mild ICH. MRI confirmed right frontoparietal parenchymal hematoma with mild surrounding edema and small volume adjacent SAH. *** Therapy evaluations completed and CIR recommended d/t pt's deficits in functional mobility and cognition. Complete NIHSS TOTAL: 6  Patient's medical record from [facility] has been reviewed by the  rehabilitation admission coordinator and physician.  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   GERD (gastroesophageal reflux disease)    Glaucoma    Hematuria    HLD (hyperlipidemia)    HTN (hypertension)    Perinephric hematoma     Has the patient had major surgery during 100 days prior to admission? No  Family History   family history includes Diabetes in his father.  Current Medications Current Medications[1]  Patients Current Diet:  Diet Order             Diet heart healthy/carb modified Room service appropriate? Yes with Assist; Fluid consistency: Thin  Diet effective now                   Precautions / Restrictions Precautions Precautions: Fall Precaution/Restrictions Comments: L sided inattention Restrictions Weight Bearing Restrictions Per Provider Order: No   Has the patient had 2 or more falls or a fall with injury in the past year? Yes  Prior Activity Level    Prior Functional Level Self Care: Did the patient need help bathing, dressing, using the toilet or eating? Independent  Indoor Mobility: Did the patient need assistance with walking from room to room (with or without device)? Independent  Stairs: Did the patient need assistance with internal or external stairs (with or without device)? Unknown (reports avoids stairs)  Functional Cognition: Did the patient need help planning regular tasks such as shopping or remembering to take medications? Independent  Patient Information    Patient's Response To:     Home Assistive Devices / Equipment Home Equipment: None  Prior Device Use: Indicate devices/aids used by the patient prior to current illness, exacerbation or injury? None of the above  Current  Functional Level Cognition  Arousal/Alertness: Awake/alert Overall Cognitive Status: Impaired/Different from baseline Orientation Level: Oriented X4 Attention: Sustained Sustained Attention: Appears intact Memory: Impaired Memory Impairment: Storage deficit, Retrieval deficit Awareness: Impaired Awareness Impairment: Emergent impairment Problem Solving: Impaired Problem Solving Impairment: Verbal basic Executive Function: Reasoning Reasoning: Impaired Reasoning Impairment: Verbal basic    Extremity Assessment (includes Sensation/Coordination)  Upper Extremity Assessment: LUE deficits/detail, Right hand dominant LUE Deficits / Details: grossly 3-/5 MMT, more difficulty with [facility] of wrist and hand.  poor coordination and sensation.  able to bring to face for adls LUE Sensation: decreased light touch, decreased proprioception LUE Coordination: decreased fine motor, decreased gross motor  Lower Extremity Assessment: Defer to PT evaluation LLE Deficits / Details: grossly 2+/5, impaired coordination and sensation    ADLs  Overall ADL's : Needs assistance/impaired Grooming: Minimal assistance, Sitting Upper Body Dressing : Sitting, Minimal assistance Lower Body Dressing: Maximal assistance, +2 for physical assistance, Sit to/from stand Toilet Transfer: Moderate assistance, +2 for physical assistance, Stand-pivot Toilet Transfer Details (indicate cue type and reason): to recliner, towards R Functional mobility during ADLs: Maximal assistance, +2 for physical assistance, Rolling walker (2 wheels)    Mobility  Overal bed mobility: Needs Assistance Bed Mobility: Supine to Sit Rolling: Mod assist Sidelying to sit: Max assist Supine to sit: Min assist Sit to supine: Max assist, +2 for physical assistance General bed mobility comments: exiting towards L side with min assist to guard and elevate trunk fully    Transfers  Overall transfer level: Needs assistance Equipment used: 2 person  hand held assist Transfers: Sit to/from Stand, Bed to chair/wheelchair/BSC Sit to Stand: Mod assist, +2 physical assistance Bed to/from chair/wheelchair/BSC transfer type:: Step pivot Step pivot transfers: Mod assist, +2 physical assistance General transfer comment: PT blocking L knee, cueing for technique to step towards R into recliner, step by step verbal and tactile cues to sequence stepping, unable to use L UE to support self    Ambulation / Gait / Stairs / Wheelchair Mobility  Ambulation/Gait Ambulation/Gait assistance: Max assist, +2 physical assistance (RN to push chair and manage lines) Gait Distance (Feet): 20 Feet Assistive device: 2 person hand held assist Gait Pattern/deviations: Step-to pattern General Gait Details: tactile cues and modA to advance L LE and to stimulate quad for knee extension during stance phase. Pt with L lateral lean, OT cueing trunk for upright/midline posture while PT managed L LE. Pt with impoved ability to clear L foot with increased distance Gait velocity: slow Gait velocity interpretation: <1.31 ft/sec, indicative of household ambulator    Posture / Balance Dynamic Sitting Balance Sitting balance - Comments: L lateral lean but able to correct with cueing. dynamically needs min assist Balance Overall balance assessment: Needs assistance Sitting-balance support: Feet  supported, Single extremity supported Sitting balance-Leahy Scale: Fair Sitting balance - Comments: L lateral lean but able to correct with cueing. dynamically needs min assist Standing balance support: Bilateral upper extremity supported, During functional activity Standing balance-Leahy Scale: Poor Standing balance comment: max +2 dynamically    Special considerations/life events  External Urinary Catheter   Previous Home Environment (from acute therapy documentation) Living Arrangements: Non-relatives/Friends  Lives With: Friend(s) Available Help at Discharge: Family Type of  Home: Apartment Home Layout: One level Home Access: Level entry Bathroom Shower/Tub: Engineer, Manufacturing Systems: Standard Home Care Services: No  Discharge Living Setting Does the patient have any problems obtaining your medications?: No  Social/Family/Support Systems    Goals    Decrease burden of Care through IP rehab admission: NA  Possible need for SNF placement upon discharge: Not anticipated  Patient Condition: {PATIENT'S CONDITION:22832}  Preadmission Screen Completed By:  Tinnie SHAUNNA Yvone Delayne, 02/16/2024 2:23 PM ______________________________________________________________________   Discussed status with Dr. PIERRETTE on *** at *** and received approval for admission today.  Admission Coordinator:  Tinnie SHAUNNA Yvone Delayne, CCC-SLP, time ***/Date ***   Assessment/Plan: Diagnosis: *** Does the need for close, 24 hr/day Medical supervision in concert with the patient's rehab needs make it unreasonable for this patient to be served in a less intensive setting? {yes_no_potentially:3041433} Co-Morbidities requiring supervision/potential complications: *** Due to {due un:6958565}, does the patient require 24 hr/day rehab nursing? {yes_no_potentially:3041433} Does the patient require coordinated care of a physician, rehab nurse, PT, OT, and SLP to address physical and functional deficits in the context of the above medical diagnosis(es)? {yes_no_potentially:3041433} Addressing deficits in the following areas: {deficits:3041436} Can the patient actively participate in an intensive therapy program of at least 3 hrs of therapy 5 days a week? {yes_no_potentially:3041433} The potential for patient to make measurable gains while on inpatient rehab is {potential:3041437} Anticipated functional outcomes upon discharge from inpatient rehab: {functional outcomes:304600100} PT, {functional outcomes:304600100} OT, {functional outcomes:304600100} SLP Estimated rehab length of stay to reach  the above functional goals is: *** Anticipated discharge destination: {anticipated dc setting:21604} 10. Overall Rehab/Functional Prognosis: {potential:3041437}   MD Signature: ***    [1]  Current Facility-Administered Medications:    acetaminophen  (TYLENOL ) tablet 650 mg, 650 mg, Oral, Q4H PRN **OR** acetaminophen  (TYLENOL ) 160 MG/5ML solution 650 mg, 650 mg, Per Tube, Q4H PRN **OR** acetaminophen  (TYLENOL ) suppository 650 mg, 650 mg, Rectal, Q4H PRN, Arora, Ashish, MD   amLODipine  (NORVASC ) tablet 10 mg, 10 mg, Oral, Daily, Rosemarie, Pramod S, MD, 10 mg at 02/16/24 9080   brimonidine  (ALPHAGAN ) 0.2 % ophthalmic solution 1 drop, 1 drop, Both Eyes, BID, Shafer, Devon, NP, 1 drop at 02/16/24 9078   Chlorhexidine  Gluconate Cloth 2 % PADS 6 each, 6 each, Topical, Daily, Arora, Ashish, MD, 6 each at 02/16/24 0900   cyclobenzaprine  (FLEXERIL ) tablet 10 mg, 10 mg, Oral, BID PRN, Remi Pippin, NP   dorzolamide -timolol  (COSOPT ) 2-0.5 % ophthalmic solution 1 drop, 1 drop, Both Eyes, BID, Shafer, Devon, NP, 1 drop at 02/16/24 9078   enoxaparin  (LOVENOX ) injection 40 mg, 40 mg, Subcutaneous, Q24H, Rosemarie, Pramod S, MD, 40 mg at 02/16/24 1211   hydrALAZINE  (APRESOLINE ) injection 20 mg, 20 mg, Intravenous, Q4H PRN, Remi, Pippin, NP, 20 mg at 02/16/24 0316   labetalol  (NORMODYNE ) injection 20 mg, 20 mg, Intravenous, Q2H PRN, Remi, Pippin, NP, 20 mg at 02/16/24 0022   latanoprost  (XALATAN ) 0.005 % ophthalmic solution 1 drop, 1 drop, Both Eyes, QHS, Shafer, Devon, NP, 1 drop at 02/15/24 2005  LORazepam  (ATIVAN ) injection 2 mg, 2 mg, Intravenous, Q4H PRN, Lindzen, Eric, MD   metoprolol  succinate (TOPROL -XL) 24 hr tablet 100 mg, 100 mg, Oral, Daily, Shafer, Devon, NP, 100 mg at 02/16/24 9081   nicotine  (NICODERM CQ  - dosed in mg/24 hours) patch 21 mg, 21 mg, Transdermal, Daily, Arora, Ashish, MD, 21 mg at 02/16/24 0919   pantoprazole  (PROTONIX ) EC tablet 40 mg, 40 mg, Oral, QHS, Sethi, Pramod S, MD, 40 mg at  02/15/24 2001   potassium chloride  SA (KLOR-CON  M) CR tablet 40 mEq, 40 mEq, Oral, BID, Rosemarie, Pramod S, MD, 40 mEq at 02/16/24 1210   senna-docusate (Senokot-S) tablet 1 tablet, 1 tablet, Oral, BID, Arora, Ashish, MD, 1 tablet at 02/16/24 0919   sodium chloride  flush (NS) 0.9 % injection 3 mL, 3 mL, Intravenous, Once, Neysa Caron PARAS, DO

## 2024-02-17 DIAGNOSIS — I619 Nontraumatic intracerebral hemorrhage, unspecified: Secondary | ICD-10-CM | POA: Diagnosis not present

## 2024-02-17 LAB — BASIC METABOLIC PANEL WITH GFR
Anion gap: 6 (ref 5–15)
BUN: 13 mg/dL (ref 8–23)
CO2: 24 mmol/L (ref 22–32)
Calcium: 8.8 mg/dL — ABNORMAL LOW (ref 8.9–10.3)
Chloride: 108 mmol/L (ref 98–111)
Creatinine, Ser: 0.84 mg/dL (ref 0.61–1.24)
GFR, Estimated: >60 mL/min (ref >60)
Glucose, Bld: 101 mg/dL — ABNORMAL HIGH (ref 70–99)
Potassium: 4.1 mmol/L (ref 3.5–5.1)
Sodium: 138 mmol/L (ref 135–145)

## 2024-02-17 MED ORDER — ATORVASTATIN CALCIUM 40 MG PO TABS
40.0000 mg | ORAL_TABLET | Freq: Every day | ORAL | Status: DC
  Administered 2024-02-17 – 2024-02-24 (×8): 40 mg via ORAL
  Filled 2024-02-17 (×8): qty 1

## 2024-02-17 MED ORDER — HYDRALAZINE HCL 25 MG PO TABS
25.0000 mg | ORAL_TABLET | Freq: Three times a day (TID) | ORAL | Status: DC
  Administered 2024-02-17 – 2024-02-19 (×6): 25 mg via ORAL
  Filled 2024-02-17 (×7): qty 1

## 2024-02-17 NOTE — Progress Notes (Signed)
 Physical Therapy Treatment Patient Details Name: Nathan Stafford MRN: [id number] DOB: 1950-05-11 Today's Date: 02/17/2024   History of Present Illness 73 yo M adm 12/13 sudden L side weakness. 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 PMH: HTN, HLD, glaucoma, tobacco abuse, cardiomyopathy, GERD, ETOH abuse    PT Comments  Pt pleasant, willing to progress mobility and states a family friend may be assisting him at D/C. Pt with improved transfers and gait with continued need for assist for balance and attention with narrow BOS. Pt would greatly benefit from intensive inpatient follow-up therapy, >3 hours/day to maximize function and independence. Will continue to follow acutely. Encouraged OOB to chair daily with staff.   BP 148/78 HR 74 96% on RA     If plan is discharge home, recommend the following: A lot of help with walking and/or transfers;A lot of help with bathing/dressing/bathroom;Assist for transportation;Supervision due to cognitive status;Assistance with feeding;Assistance with cooking/housework;Direct supervision/assist for medications management;Direct supervision/assist for financial management   Can travel by private vehicle        Equipment Recommendations  Other (comment) psychologist, educational)    Recommendations for Other Services       Precautions / Restrictions Precautions Precautions: Fall;Other (comment) Recall of Precautions/Restrictions: Impaired Precaution/Restrictions Comments: Lt sided inattention and weakness     Mobility  Bed Mobility Overal bed mobility: Needs Assistance Bed Mobility: Supine to Sit     Supine to sit: Used rails, Min assist, HOB elevated     General bed mobility comments: HOB 20 degrees, pivot to left with cues for safety, awareness of LUE positioning and safety with min assist to  fully elevate trunk from surface    Transfers Overall transfer level: Needs assistance   Transfers: Sit to/from Stand Sit to Stand: Min assist           General transfer comment: min assist to rise from bed and from chair. 5 repeated trials from recliner with assist for balance in standing, cues for hand placement and safety with good control, lack of LUE strength and control for transfer    Ambulation/Gait Ambulation/Gait assistance: Mod assist, +2 safety/equipment Gait Distance (Feet): 120 Feet Assistive device: 1 person hand held assist Gait Pattern/deviations: Step-to pattern, Decreased stride length, Narrow base of support, Trunk flexed   Gait velocity interpretation: <1.8 ft/sec, indicate of risk for recurrent falls   General Gait Details: pt with narrow BOS, scissoring at times with anterior lean of trunk and required physical assist of RUE on therapist and support at pelvis to maintain balance. Mod cues for increased BOS, clearing obstacles on right and trunk control for midline. close chair follow   Stairs             Wheelchair Mobility     Tilt Bed    Modified Rankin (Stroke Patients Only) Modified Rankin (Stroke Patients Only) Pre-Morbid Rankin Score: No symptoms Modified Rankin: Moderately severe disability     Balance Overall balance assessment: Needs assistance Sitting-balance support: Feet supported, Single extremity supported Sitting balance-Leahy Scale: Fair     Standing balance support: During functional activity, Single extremity supported Standing balance-Leahy Scale: Fair Standing balance comment: RUE support with gait, can static stand with CGA                            Communication Communication Communication:  No apparent difficulties  Cognition Arousal: Alert Behavior During Therapy: Flat affect   PT - Cognitive impairments: Awareness, Attention, Problem solving, Safety/Judgement                       PT  - Cognition Comments: decreased lt attention and awareness of deficits and balance, following commands appropriately Following commands: Intact Following commands impaired: Only follows one step commands consistently    Cueing Cueing Techniques: Verbal cues, Gestural cues, Tactile cues  Exercises General Exercises - Lower Extremity Long Arc Quad: AROM, Left, Seated, Strengthening, 15 reps Hip Flexion/Marching: AROM, Strengthening, Seated, Left, 15 reps    General Comments        Pertinent Vitals/Pain Pain Assessment Pain Assessment: No/denies pain    Home Living                          Prior Function            PT Goals (current goals can now be found in the care plan section) Progress towards PT goals: Progressing toward goals    Frequency    Min 4X/week      PT Plan      Co-evaluation              AM-PAC PT 6 Clicks Mobility   Outcome Measure  Help needed turning from your back to your side while in a flat bed without using bedrails?: A Little Help needed moving from lying on your back to sitting on the side of a flat bed without using bedrails?: A Little Help needed moving to and from a bed to a chair (including a wheelchair)?: A Lot Help needed standing up from a chair using your arms (e.g., wheelchair or bedside chair)?: A Little Help needed to walk in hospital room?: Total Help needed climbing 3-5 steps with a railing? : Total 6 Click Score: 13    End of Session Equipment Utilized During Treatment: Gait belt Activity Tolerance: Patient tolerated treatment well Patient left: in chair;with call bell/phone within reach;with chair alarm set Nurse Communication: Mobility status PT Visit Diagnosis: Difficulty in walking, not elsewhere classified (R26.2);Hemiplegia and hemiparesis;Muscle weakness (generalized) (M62.81) Hemiplegia - Right/Left: Left Hemiplegia - dominant/non-dominant: Non-dominant Hemiplegia - caused by: Nontraumatic  intracerebral hemorrhage     Time: 0835-0857 PT Time Calculation (min) (ACUTE ONLY): 22 min  Charges:    $Gait Training: 8-22 mins PT General Charges $$ ACUTE PT VISIT: 1 Visit                     Lenoard SQUIBB, PT Acute Rehabilitation Services Office: [phone]    Lenoard NOVAK Bienvenido Proehl 02/17/2024, 11:32 AM

## 2024-02-17 NOTE — Progress Notes (Addendum)
 STROKE TEAM PROGRESS NOTE   INTERIM HISTORY/SUBJECTIVE  Vital signs have been stable. There were no acute events overnight. No ativan  has been required. Pt neurologic exam unchanged from yesterday. Pending echo returned without concern.  OBJECTIVE  CBC    Component Value Date/Time   WBC 7.2 02/16/2024 0536   RBC 5.42 02/16/2024 0536   HGB 16.2 02/16/2024 0536   HGB 14.9 05/09/2017 1011   HCT 46.9 02/16/2024 0536   HCT 44.5 05/09/2017 1011   PLT 150 02/16/2024 0536   PLT 173 05/09/2017 1011   MCV 86.5 02/16/2024 0536   MCV 87 05/09/2017 1011   MCH 29.9 02/16/2024 0536   MCHC 34.5 02/16/2024 0536   RDW 14.4 02/16/2024 0536   RDW 15.7 (H) 05/09/2017 1011   LYMPHSABS 1.6 02/14/2024 0751   MONOABS 0.8 02/14/2024 0751   EOSABS 0.0 02/14/2024 0751   BASOSABS 0.0 02/14/2024 0751    BMET    Component Value Date/Time   NA 138 02/17/2024 0224   NA 142 05/11/2020 0944   K 4.1 02/17/2024 0224   CL 108 02/17/2024 0224   CO2 24 02/17/2024 0224   GLUCOSE 101 (H) 02/17/2024 0224   BUN 13 02/17/2024 0224   BUN 9 05/11/2020 0944   CREATININE 0.84 02/17/2024 0224   CREATININE 1.03 04/26/2015 1440   CALCIUM  8.8 (L) 02/17/2024 0224   EGFR 91 05/11/2020 0944   GFRNONAA >60 02/17/2024 0224   GFRNONAA 76 04/26/2015 1440    IMAGING past 24 hours No results found.   Vitals:   02/17/24 0800 02/17/24 0900 02/17/24 1000 02/17/24 1123  BP: (!) 148/78 (!) 144/69 (!) 159/69 (!) 148/78  Pulse: (!) 59 63 61   Resp: 17 12 17    Temp: (!) 97.5 F (36.4 C)     TempSrc: Oral     SpO2: 97% 95% 98%   Weight:      Height:         PHYSICAL EXAM General:  Alert, well-nourished, well-developed patient in no acute distress Psych:  Mood and affect appropriate for situation CV: Regular rate and rhythm on monitor Respiratory:  Regular, unlabored respirations on room air GI: Abdomen soft and nontender   NEURO:  Mental Status: AA&Ox3, patient is unsure why he is in the hospital.   Speech/Language: speech is without dysarthria or aphasia.  Naming, repetition, fluency, and comprehension intact.  Cranial Nerves:  II: PERRL. Visual fields full.  III, IV, VI: EOMI. Eyelids elevate symmetrically.  V: Sensation is intact to light touch and symmetrical to face.  VII: Left facial droop VIII: hearing intact to voice. IX, X: Palate elevates symmetrically. Phonation is normal.  KP:Dynloizm shrug 5/5. XII: tongue is midline without fasciculations. Motor:  LUE 3/5, hand grasp weak LLE mild drift 4/5 Tone: is normal and bulk is normal Sensation- Intact to light touch bilaterally.  Mild left Hemi inattention and extinction to light touch to DSS.   Coordination: FTN no ataxia out of proportion to weakness, HKS: no ataxia in BLE.No drift.  Gait- deferred   ASSESSMENT/PLAN  [name] is a 73 y.o. male with history of HTN, HLD, glaucoma, tobacco use admitted for left sided weakness, left facial droop, slurred speech.  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  CTA head & neck Acute right perirolandic intracerebral hemorrhage without CTA spot sign  MRI right frontoparietal parenchymal hematoma with mild surrounding edema and small volume adjacent subarachnoid hemorrhage 2D Echo: 55-60%, atria normal in size.  LDL 97 HgbA1c 4.9 VTE prophylaxis - SCDs aspirin  81 mg daily prior to admission, now on No antithrombotic  Therapy recommendations: PT: SNF   OT: SNF Disposition: per primary, likely to SNF based on PT/OT  Hypertension Home meds:  metoprolol  succinate, aldactone  (not taking aldactone ) Stable Blood pressure goal less 160 Cleviprex  infusing PRN hydralazine  and  labetaolol Resume metoprolol , add norvasc   Hyperlipidemia Home meds:  Simvastatin  20mg , resumed in hospital LDL 97, goal < 70 Continue statin at discharge  Diabetes type II Controlled HgbA1c 4.9, goal < 7.0  Dysphagia Patient has post-stroke dysphagia, SLP consulted    Diet   Diet heart healthy/carb modified Room service appropriate? Yes with Assist; Fluid consistency: Thin  Advance diet as tolerated  Other Stroke Risk Factors ETOH use, alcohol level <15, advised to drink no more than 2 drink(s) a day  Alcohol use Monitoring CIWA, can spot dose Ativan  as needed. If pt becomes agitated, would prefer Depakote 500-1000mg  over a more sedating medication.  Glaucoma Home drops resumed  Thiamine deficiency No previous labs On supplement at home Level ordered Family notes a progressive cognitive decline/memory issues consistent with the micro hemorrhages seen on MRI   Hospital day # 3  Pt is stable from a neurologic perspective  D. Penne Mori, DO, PGY1 Neurology Stroke Team 02/17/2024 12:00 PM  I have personally obtained history,examined this patient, reviewed notes, independently viewed imaging studies, participated in medical decision making and plan of care.ROS completed by me personally and pertinent positives fully documented  I have made any additions or clarifications directly to the above note. Agree with note above.  Mobilize out of bed.  Continue therapies.  Transfer out of ICU to floor bed.  Transfer to rehab when bed available.  Eather Popp, MD Medical Director [facility] Pager: [phone] 02/17/2024 2:45 PM   To contact Stroke Continuity provider, please refer to [email or web address]. After hours, contact General Neurology

## 2024-02-17 NOTE — Plan of Care (Addendum)
 0830:  PT rounded on pt.  See PT note.  0920:  Neuro team rounded on pt.  See provider note.  1203: CIR rep rounded on pt.  See note.   Problem: Education: Goal: Knowledge of disease or condition will improve Outcome: Progressing Goal: Knowledge of secondary prevention will improve (MUST DOCUMENT ALL) Outcome: Progressing Goal: Knowledge of patient specific risk factors will improve (DELETE if not current risk factor) Outcome: Progressing   Problem: Intracerebral Hemorrhage Tissue Perfusion: Goal: Complications of Intracerebral Hemorrhage will be minimized Outcome: Progressing   Problem: Coping: Goal: Will verbalize positive feelings about self Outcome: Progressing Goal: Will identify appropriate support needs Outcome: Progressing   Problem: Health Behavior/Discharge Planning: Goal: Ability to manage health-related needs will improve Outcome: Progressing Goal: Goals will be collaboratively established with patient/family Outcome: Progressing   Problem: Self-Care: Goal: Ability to participate in self-care as condition permits will improve Outcome: Progressing Goal: Verbalization of feelings and concerns over difficulty with self-care will improve Outcome: Progressing Goal: Ability to communicate needs accurately will improve Outcome: Progressing   Problem: Nutrition: Goal: Risk of aspiration will decrease Outcome: Progressing Goal: Dietary intake will improve Outcome: Progressing   Problem: Education: Goal: Knowledge of General Education information will improve Description: Including pain rating scale, medication(s)/side effects and non-pharmacologic comfort measures Outcome: Progressing   Problem: Health Behavior/Discharge Planning: Goal: Ability to manage health-related needs will improve Outcome: Progressing   Problem: Clinical Measurements: Goal: Ability to maintain clinical measurements within normal limits will improve Outcome: Progressing Goal: Will remain  free from infection Outcome: Progressing Goal: Diagnostic test results will improve Outcome: Progressing Goal: Respiratory complications will improve Outcome: Progressing Goal: Cardiovascular complication will be avoided Outcome: Progressing   Problem: Activity: Goal: Risk for activity intolerance will decrease Outcome: Progressing   Problem: Nutrition: Goal: Adequate nutrition will be maintained Outcome: Progressing   Problem: Coping: Goal: Level of anxiety will decrease Outcome: Progressing   Problem: Elimination: Goal: Will not experience complications related to bowel motility Outcome: Progressing Goal: Will not experience complications related to urinary retention Outcome: Progressing   Problem: Pain Managment: Goal: General experience of comfort will improve and/or be controlled Outcome: Progressing   Problem: Safety: Goal: Ability to remain free from injury will improve Outcome: Progressing   Problem: Skin Integrity: Goal: Risk for impaired skin integrity will decrease Outcome: Progressing

## 2024-02-17 NOTE — Progress Notes (Signed)
 PROGRESS NOTE    Nathan Stafford  FMW:[id number] DOB: 1951/02/09 DOA: 02/14/2024 PCP: Joshua Debby CROME, MD   Brief Narrative:  Nathan Stafford is a 73 y.o. male with hx of hypertension, hyperlipidemia, glaucoma, tobacco abuse, presenting to the emergency department for evaluation of sudden onset of left-sided weakness.  Patient noted to have left facial droop left-sided weakness and slurred speech NIH stroke scale of 8.  Imaging at that time indicated intraparenchymal hemorrhage with subarachnoid hemorrhage, right.  Neurology called for admission.  Patient transitioned to TRH service on 12/16 given improvement in symptoms, concern over possible amyloid process as etiology for noted bleed.  Echo completed per protocol without worrisome findings, remainder of patient's labs appear to be within normal limits.  Patient continues to have ongoing deficits, at this time he is being evaluated for ongoing physical therapy with plan for ultimate discharge home once he has recovered from his injury.  Assessment & Plan:   Principal Problem:   Hemorrhagic stroke (HCC)  Intraparenchymal hemorrhage and subarachnoid hemorrhage, right, POA - Neurology initially primary suspecting CAA as etiology based on imaging and risk factors  - Echo completed, MRI, CTA head and neck confirm bleed with mild surrounding edema -which appears to be stable - Patient continues to participate with physical therapy currently recommending placement for ongoing needs and treatment - Neurology recommending against ongoing antithrombotics, previously on aspirin  81 daily  Hypertension -Minimally elevated -Cleviprex  drip completed -Current regimen includes amlodipine , metoprolol  as well as as needed hydralazine  and labetalol  -Patient continues to require as needed antihypertensives around-the-clock, will add scheduled hydralazine  - notable ACE inhibitor allergy, if blood pressure remains uncontrolled with hydralazine  would consider  low-dose ARB vs spironolactone  per guidelines   Hyperlipidemia -Transition to atorvastatin  40 Diabetes type II Controlled -A1c 4.9, well-controlled Dysphagia -secondary to above, resolving  Glaucoma -continue brimonidine  dorzolamide /timolol  latanopros Presumed thiamine deficiency - on thiamine per neurology  DVT prophylaxis: enoxaparin  (LOVENOX ) injection 40 mg Start: 02/16/24 1015 SCD's Start: 02/14/24 0806 Code Status:   Code Status: Full Code Family Communication: None presents  Status is: Inpatient  Dispo: The patient is from: Home              Anticipated d/c is to: To be determined, CIR vs SNF              Anticipated d/c date is: Imminent pending safe disposition              Patient currently is medically stable for discharge  Consultants:  Neurology  Procedures:  None  Antimicrobials:  None indicated  Subjective: No acute issues or events overnight, denies nausea vomiting diarrhea constipation headache fevers chills or chest pain  Objective: Vitals:   02/17/24 0520 02/17/24 0600 02/17/24 0700 02/17/24 0735  BP: (!) 195/92 139/67 (!) 166/71 (!) 172/79  Pulse: 78 74 65 61  Resp: 15 17 20 15   Temp:      TempSrc:      SpO2: 98% 97% 98% 97%  Weight:      Height:        Intake/Output Summary (Last 24 hours) at 02/17/2024 0759 Last data filed at 02/17/2024 0500 Gross per 24 hour  Intake 720 ml  Output 775 ml  Net -55 ml   Filed Weights   02/14/24 0700 02/14/24 0828  Weight: 85.2 kg 85 kg    Examination:  General:  Pleasantly resting in bed, No acute distress. HEENT: Left facial droop noted. Neck:  Without mass or deformity.  Trachea is  midline. Lungs:  Clear to auscultate bilaterally without rhonchi, wheeze, or rales. Heart:  Regular rate and rhythm.  Without murmurs, rubs, or gallops. Abdomen:  Soft, nontender, nondistended.  Without guarding or rebound. Extremities: Left upper/lower extremity 4/5.  Right side 5/5  Data Reviewed: I have  personally reviewed following labs and imaging studies  CBC: Recent Labs  Lab 02/14/24 0751 02/14/24 0759 02/16/24 0536  WBC 6.4  --  7.2  NEUTROABS 3.9  --   --   HGB 15.4 17.0 16.2  HCT 46.5 50.0 46.9  MCV 90.5  --  86.5  PLT 157  --  150   Basic Metabolic Panel: Recent Labs  Lab 02/14/24 0751 02/14/24 0759 02/16/24 0536 02/17/24 0224  NA 140 144 137 138  K 4.1 4.0 3.3* 4.1  CL 108 107 105 108  CO2 27  --  20* 24  GLUCOSE 109* 107* 132* 101*  BUN 10 12 8 13   CREATININE 0.91 1.00 0.74 0.84  CALCIUM  9.2  --  9.0 8.8*   GFR: Estimated Creatinine Clearance: 89.7 mL/min (by C-G formula based on SCr of 0.84 mg/dL). Liver Function Tests: Recent Labs  Lab 02/14/24 0751  AST 55*  ALT 45*  ALKPHOS 61  BILITOT 0.7  PROT 6.7  ALBUMIN 3.5   No results for input(s): LIPASE, AMYLASE in the last 168 hours. No results for input(s): AMMONIA in the last 168 hours. Coagulation Profile: Recent Labs  Lab 02/14/24 0751  INR 1.0   Cardiac Enzymes: No results for input(s): CKTOTAL, CKMB, CKMBINDEX, TROPONINI in the last 168 hours. BNP (last 3 results) Recent Labs    08/21/23 0939  PROBNP 53.0   HbA1C: Recent Labs    02/14/24 1930  HGBA1C 4.9   CBG: Recent Labs  Lab 02/14/24 0753  GLUCAP 107*   Lipid Profile: Recent Labs    02/14/24 1930  CHOL 223*  HDL 46  LDLCALC 97  TRIG 400*  CHOLHDL 4.8   Thyroid  Function Tests: No results for input(s): TSH, T4TOTAL, FREET4, T3FREE, THYROIDAB in the last 72 hours. Anemia Panel: No results for input(s): VITAMINB12, FOLATE, FERRITIN, TIBC, IRON, RETICCTPCT in the last 72 hours. Sepsis Labs: No results for input(s): PROCALCITON, LATICACIDVEN in the last 168 hours.  Recent Results (from the past 240 hours)  MRSA Next Gen by PCR, Nasal     Status: None   Collection Time: 02/14/24  5:31 PM   Specimen: Nasal Mucosa; Nasal Swab  Result Value Ref Range Status   MRSA by PCR Next  Gen NOT DETECTED NOT DETECTED Final    Comment: (NOTE) The GeneXpert MRSA Assay (FDA approved for NASAL specimens only), is one component of a comprehensive MRSA colonization surveillance program. It is not intended to diagnose MRSA infection nor to guide or monitor treatment for MRSA infections. Test performance is not FDA approved in patients less than 45 years old. Performed at [facility], [address], [place], [place] [address]          Radiology Studies: ECHOCARDIOGRAM COMPLETE Result Date: 02/16/2024    ECHOCARDIOGRAM REPORT   Patient Name:   MARKICE TORBERT Date of Exam: 02/16/2024 Medical Rec #:  [id number]    Height:       74.0 in Accession #:    [id number]   Weight:       187.4 lb Date of Birth:  06/05/1950     BSA:          2.114 m Patient Age:  73 years     BP:           137/84 mmHg Patient Gender: M            HR:           66 bpm. Exam Location:  Inpatient Procedure: 2D Echo, Cardiac Doppler and Color Doppler (Both Spectral and Color            Flow Doppler were utilized during procedure). Indications:    Stroke I63.9  History:        Patient has prior history of Echocardiogram examinations, most                 recent 08/21/2023. Signs/Symptoms:Hypertensive Heart Disease.  Sonographer:    Nathanel Devonshire Referring Phys: [id number] ASHISH ARORA IMPRESSIONS  1. Left ventricular ejection fraction, by estimation, is 55 to 60%. Left ventricular ejection fraction by 2D MOD biplane is 56.5 %. The left ventricle has normal function. The left ventricle has no regional wall motion abnormalities. Left ventricular diastolic parameters were normal.  2. Right ventricular systolic function is normal. The right ventricular size is normal. There is normal pulmonary artery systolic pressure.  3. The mitral valve is normal in structure. No evidence of mitral valve regurgitation. No evidence of mitral stenosis.  4. The aortic valve is normal in structure. Aortic valve regurgitation is not visualized. No  aortic stenosis is present.  5. The inferior vena cava is normal in size with greater than 50% respiratory variability, suggesting right atrial pressure of 3 mmHg. Comparison(s): No prior Echocardiogram. FINDINGS  Left Ventricle: Left ventricular ejection fraction, by estimation, is 55 to 60%. Left ventricular ejection fraction by 2D MOD biplane is 56.5 %. The left ventricle has normal function. The left ventricle has no regional wall motion abnormalities. The left ventricular internal cavity size was normal in size. There is no left ventricular hypertrophy. Left ventricular diastolic parameters were normal. Right Ventricle: The right ventricular size is normal. No increase in right ventricular wall thickness. Right ventricular systolic function is normal. There is normal pulmonary artery systolic pressure. Left Atrium: Left atrial size was normal in size. Right Atrium: Right atrial size was normal in size. Pericardium: There is no evidence of pericardial effusion. Mitral Valve: The mitral valve is normal in structure. No evidence of mitral valve regurgitation. No evidence of mitral valve stenosis. Tricuspid Valve: The tricuspid valve is normal in structure. Tricuspid valve regurgitation is not demonstrated. No evidence of tricuspid stenosis. Aortic Valve: The aortic valve is normal in structure. Aortic valve regurgitation is not visualized. No aortic stenosis is present. Aortic valve mean gradient measures 4.0 mmHg. Aortic valve peak gradient measures 7.2 mmHg. Aortic valve area, by VTI measures 3.50 cm. Pulmonic Valve: The pulmonic valve was normal in structure. Pulmonic valve regurgitation is not visualized. No evidence of pulmonic stenosis. Aorta: The aortic root is normal in size and structure. Venous: The inferior vena cava is normal in size with greater than 50% respiratory variability, suggesting right atrial pressure of 3 mmHg. IAS/Shunts: No atrial level shunt detected by color flow Doppler.  LEFT  VENTRICLE PLAX 2D                        Biplane EF (MOD) LVIDd:         5.10 cm         LV Biplane EF:   Left LVIDs:         3.30 cm  ventricular LV PW:         1.10 cm                          ejection LV IVS:        1.00 cm                          fraction by LVOT diam:     2.30 cm                          2D MOD LV SV:         92                               biplane is LV SV Index:   43                               56.5 %. LVOT Area:     4.15 cm LV IVRT:       88 msec         Diastology                                LV e' medial:    2.83 cm/s                                LV E/e' medial:  13.0 LV Volumes (MOD)               LV e' lateral:   4.13 cm/s LV vol d, MOD    77.8 ml       LV E/e' lateral: 8.9 A2C: LV vol d, MOD    104.0 ml A4C: LV vol s, MOD    30.1 ml A2C: LV vol s, MOD    45.5 ml A4C: LV SV MOD A2C:   47.7 ml LV SV MOD A4C:   104.0 ml LV SV MOD BP:    51.4 ml RIGHT VENTRICLE             IVC RV Basal diam:  4.10 cm     IVC diam: 1.60 cm RV S prime:     11.20 cm/s TAPSE (M-mode): 1.8 cm      PULMONARY VEINS                             Diastolic Velocity: 39.40 cm/s                             S/D Velocity:       0.90                             Systolic Velocity:  36.40 cm/s LEFT ATRIUM             Index        RIGHT ATRIUM           Index LA diam:        3.10 cm 1.47 cm/m   RA Area:  13.80 cm LA Vol (A2C):   37.0 ml 17.50 ml/m  RA Volume:   37.20 ml  17.60 ml/m LA Vol (A4C):   34.9 ml 16.51 ml/m LA Biplane Vol: 38.3 ml 18.12 ml/m  AORTIC VALVE AV Area (Vmax):    2.98 cm AV Area (Vmean):   2.86 cm AV Area (VTI):     3.50 cm AV Vmax:           134.00 cm/s AV Vmean:          87.900 cm/s AV VTI:            0.262 m AV Peak Grad:      7.2 mmHg AV Mean Grad:      4.0 mmHg LVOT Vmax:         96.00 cm/s LVOT Vmean:        60.600 cm/s LVOT VTI:          0.221 m LVOT/AV VTI ratio: 0.84  AORTA Ao Root diam: 3.90 cm Ao Asc diam:  3.00 cm MITRAL VALVE MV Area (PHT): 2.56 cm      SHUNTS MV Decel Time: 296 msec     Systemic VTI:  0.22 m MV E velocity: 36.80 cm/s   Systemic Diam: 2.30 cm MV A velocity: 102.00 cm/s MV E/A ratio:  0.36 Franck Azobou Tonleu Electronically signed by Joelle Cedars Tonleu Signature Date/Time: 02/16/2024/9:26:55 AM    Final     Scheduled Meds:  amLODipine   10 mg Oral Daily   brimonidine   1 drop Both Eyes BID   Chlorhexidine  Gluconate Cloth  6 each Topical Daily   dorzolamide -timolol   1 drop Both Eyes BID   enoxaparin  (LOVENOX ) injection  40 mg Subcutaneous Q24H   latanoprost   1 drop Both Eyes QHS   metoprolol  succinate  100 mg Oral Daily   nicotine   21 mg Transdermal Daily   pantoprazole   40 mg Oral QHS   senna-docusate  1 tablet Oral BID   sodium chloride  flush  3 mL Intravenous Once   Continuous Infusions:   LOS: 3 days   Time spent:  Elsie JAYSON Montclair, DO [facility]  If 7PM-7AM, please contact night-coverage [email or web address]  02/17/2024, 7:59 AM

## 2024-02-17 NOTE — Progress Notes (Signed)
 Inpatient Rehab Admissions Coordinator:   CIR following. Son is working on a plan for Pt. To have 24/7 support at d/c, but currently does not have anyone to stay days with pt. While friend Freddy works. I will continue to follow and send case to insurance once medically ready if dispo can be confirmed.  Leita Kleine, MS, CCC-SLP Rehab Admissions Coordinator  [phone] (celll) [phone] (office)

## 2024-02-18 DIAGNOSIS — I609 Nontraumatic subarachnoid hemorrhage, unspecified: Secondary | ICD-10-CM | POA: Diagnosis not present

## 2024-02-18 DIAGNOSIS — G936 Cerebral edema: Secondary | ICD-10-CM | POA: Diagnosis not present

## 2024-02-18 DIAGNOSIS — E859 Amyloidosis, unspecified: Secondary | ICD-10-CM | POA: Diagnosis not present

## 2024-02-18 DIAGNOSIS — I611 Nontraumatic intracerebral hemorrhage in hemisphere, cortical: Secondary | ICD-10-CM | POA: Diagnosis not present

## 2024-02-18 DIAGNOSIS — I619 Nontraumatic intracerebral hemorrhage, unspecified: Secondary | ICD-10-CM | POA: Diagnosis not present

## 2024-02-18 MED ORDER — ZIPRASIDONE MESYLATE 20 MG IM SOLR
10.0000 mg | Freq: Once | INTRAMUSCULAR | Status: AC
  Administered 2024-02-19: 03:00:00 10 mg via INTRAMUSCULAR
  Filled 2024-02-18 (×2): qty 20

## 2024-02-18 NOTE — NC FL2 (Signed)
 [place]  MEDICAID FL2 LEVEL OF CARE FORM     IDENTIFICATION  Patient Name: Nathan Stafford Birthdate: 10/04/1950 Sex: male Admission Date (Current Location): 02/14/2024  [facility] and [place] Number:  Producer, Television/film/video and Address:  [facility] [place], [address], [place], [place] [address]      Provider Number: [id number]  Attending Physician Name and Address:  Lue Elsie BROCKS, MD  Relative Name and Phone Number:  Reiley, Keisler, Emergency Contact  [phone]    Current Level of Care: Hospital Recommended Level of Care: Skilled Nursing Facility Prior Approval Number:    Date Approved/Denied:   PASRR Number: [id number]  Discharge Plan:      Current Diagnoses: Patient Active Problem List   Diagnosis Date Noted   Hemorrhagic stroke (HCC) 02/14/2024   Alcoholic hepatitis without ascites (HCC) 08/21/2023   Dyslipidemia, goal LDL below 70 11/12/2022   Need for prophylactic vaccination and inoculation against varicella 11/12/2022   Diuretic-induced hypokalemia 11/14/2021   Benign prostatic hyperplasia without lower urinary tract symptoms 11/13/2020   Encounter for general adult medical examination with abnormal findings 11/13/2020   Prediabetes 05/11/2020   Hypertensive heart disease with chronic combined systolic and diastolic congestive heart failure (HCC) 05/11/2020   Erectile dysfunction 07/11/2016   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 11/05/2011   HTN (hypertension) 07/11/2011   Current smoker 07/11/2011    Orientation RESPIRATION BLADDER Height & Weight     Self, Time  Normal External catheter Weight: 187 lb 6.3 oz (85 kg) Height:  6' 2 (188 cm)  BEHAVIORAL SYMPTOMS/MOOD NEUROLOGICAL BOWEL NUTRITION STATUS      Continent Diet (heart healthy/carb modified)  AMBULATORY STATUS COMMUNICATION OF NEEDS Skin   Limited Assist Verbally Normal                       Personal Care Assistance Level of Assistance  Bathing, Feeding,  Dressing Bathing Assistance: Limited assistance Feeding assistance: Limited assistance Dressing Assistance: Limited assistance     Functional Limitations Info             SPECIAL CARE FACTORS FREQUENCY  PT (By licensed PT), OT (By licensed OT)     PT Frequency: 5 times per week OT Frequency: 5 times per week            Contractures      Additional Factors Info  Code Status, Allergies Code Status Info: full Allergies Info: Ace Inhibitors           Current Medications (02/18/2024):  This is the current hospital active medication list Current Facility-Administered Medications  Medication Dose Route Frequency Provider Last Rate Last Admin   acetaminophen  (TYLENOL ) tablet 650 mg  650 mg Oral Q4H PRN Voncile Isles, MD       Or   acetaminophen  (TYLENOL ) 160 MG/5ML solution 650 mg  650 mg Per Tube Q4H PRN Arora, Ashish, MD       Or   acetaminophen  (TYLENOL ) suppository 650 mg  650 mg Rectal Q4H PRN Arora, Ashish, MD       amLODipine  (NORVASC ) tablet 10 mg  10 mg Oral Daily Sethi, Pramod S, MD   10 mg at 02/18/24 0910   atorvastatin  (LIPITOR) tablet 40 mg  40 mg Oral Daily Lue Elsie BROCKS, MD   40 mg at 02/18/24 0910   brimonidine  (ALPHAGAN ) 0.2 % ophthalmic solution 1 drop  1 drop Both Eyes BID Remi Pippin, NP   1 drop at 02/18/24 [phone]  Chlorhexidine  Gluconate Cloth 2 % PADS 6 each  6 each Topical Daily Voncile Isles, MD   6 each at 02/17/24 1058   cyclobenzaprine  (FLEXERIL ) tablet 10 mg  10 mg Oral BID PRN Remi Pippin, NP       dorzolamide -timolol  (COSOPT ) 2-0.5 % ophthalmic solution 1 drop  1 drop Both Eyes BID Remi Pippin, NP   1 drop at 02/18/24 0913   enoxaparin  (LOVENOX ) injection 40 mg  40 mg Subcutaneous Q24H Sethi, Pramod S, MD   40 mg at 02/17/24 1033   hydrALAZINE  (APRESOLINE ) injection 20 mg  20 mg Intravenous Q4H PRN Remi Pippin, NP   20 mg at 02/17/24 0522   hydrALAZINE  (APRESOLINE ) tablet 25 mg  25 mg Oral Q8H Lue Elsie BROCKS, MD   25 mg at  02/18/24 9381   labetalol  (NORMODYNE ) injection 20 mg  20 mg Intravenous Q2H PRN Remi Pippin, NP   20 mg at 02/17/24 2105   latanoprost  (XALATAN ) 0.005 % ophthalmic solution 1 drop  1 drop Both Eyes QHS Remi Pippin, NP   1 drop at 02/17/24 2105   LORazepam  (ATIVAN ) injection 2 mg  2 mg Intravenous Q4H PRN Merrianne Locus, MD       metoprolol  succinate (TOPROL -XL) 24 hr tablet 100 mg  100 mg Oral Daily Remi Pippin, NP   100 mg at 02/18/24 0911   nicotine  (NICODERM CQ  - dosed in mg/24 hours) patch 21 mg  21 mg Transdermal Daily Arora, Ashish, MD   21 mg at 02/17/24 1031   Oral care mouth rinse  15 mL Mouth Rinse PRN Rosemarie Eather RAMAN, MD       pantoprazole  (PROTONIX ) EC tablet 40 mg  40 mg Oral QHS Sethi, Pramod S, MD   40 mg at 02/17/24 2108   senna-docusate (Senokot-S) tablet 1 tablet  1 tablet Oral BID Arora, Ashish, MD   1 tablet at 02/17/24 2108   sodium chloride  flush (NS) 0.9 % injection 3 mL  3 mL Intravenous Once Neysa Caron PARAS, DO         Discharge Medications: Please see discharge summary for a list of discharge medications.  Relevant Imaging Results:  Relevant Lab Results:   Additional Information SS #: [id number]  Iyla Balzarini E Cylan Borum, LCSW

## 2024-02-18 NOTE — Progress Notes (Signed)
 Inpatient Rehab Admissions Coordinator:    Son wants SNF, CIR will sing off.   Leita Kleine, MS, CCC-SLP Rehab Admissions Coordinator  [phone] (celll) [phone] (office)

## 2024-02-18 NOTE — Plan of Care (Signed)
°  Problem: Intracerebral Hemorrhage Tissue Perfusion: Goal: Complications of Intracerebral Hemorrhage will be minimized Outcome: Progressing   Problem: Coping: Goal: Will identify appropriate support needs Outcome: Progressing   Problem: Self-Care: Goal: Ability to participate in self-care as condition permits will improve Outcome: Progressing Goal: Ability to communicate needs accurately will improve Outcome: Progressing   Problem: Nutrition: Goal: Dietary intake will improve Outcome: Progressing   Problem: Activity: Goal: Risk for activity intolerance will decrease Outcome: Progressing   Problem: Nutrition: Goal: Adequate nutrition will be maintained Outcome: Progressing

## 2024-02-18 NOTE — Progress Notes (Signed)
 Speech Language Pathology Treatment: Cognitive-Linguistic  Patient Details Name: Nathan Stafford MRN: [id number] DOB: 1950/06/02 Today's Date: 02/18/2024 Time: 8547-8494 SLP Time Calculation (min) (ACUTE ONLY): 13 min  Assessment / Plan / Recommendation Clinical Impression  Pt completed a medication management task, in which he was asked to identify errors in a pillbox. He needed Mod multimodal cueing for working memory and problem solving, stating that this felt much more difficult than his baseline. Increased time and reminders for the purpose of the task were beneficial strategies. Will continue following to target functional cognitive tasks.    HPI HPI: 73 yo M adm 12/13 sudden L side weakness. 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 PMH: HTN, HLD, glaucoma, tobacco abuse, cardiomyopathy, GERD, ETOH abuse      SLP Plan  Continue with current plan of care        Swallow Evaluation Recommendations         Recommendations                         Frequent or constant Supervision/Assistance Cognitive communication deficit (M58.158)     Continue with current plan of care     Damien Blumenthal, M.A., CCC-SLP Speech Language Pathology, Acute Rehabilitation Services  Secure Chat preferred [phone]   02/18/2024, 3:27 PM

## 2024-02-18 NOTE — Progress Notes (Signed)
 PROGRESS NOTE    Nathan Stafford  FMW:[id number] DOB: 1950-10-25 DOA: 02/14/2024 PCP: Joshua Debby CROME, MD   Brief Narrative:  Nathan Stafford is a 73 y.o. male with hx of hypertension, hyperlipidemia, glaucoma, tobacco abuse, presenting to the emergency department for evaluation of sudden onset of left-sided weakness.  Patient noted to have left facial droop left-sided weakness and slurred speech NIH stroke scale of 8.  Imaging at that time indicated intraparenchymal hemorrhage with subarachnoid hemorrhage, right.  Neurology called for admission.  Patient transitioned to TRH service on 12/16 given improvement in symptoms, concern over possible amyloid process as etiology for noted bleed.  Echo completed per protocol without worrisome findings, remainder of patient's labs appear to be within normal limits.  Patient continues to have ongoing deficits, at this time he is being evaluated for ongoing physical therapy with plan for ultimate discharge home once he has recovered from his injury.  Assessment & Plan:   Principal Problem:   Hemorrhagic stroke (HCC)  Intraparenchymal hemorrhage and subarachnoid hemorrhage, right, POA - Neurology initially primary suspecting CAA as etiology based on imaging and risk factors  - Echo completed, MRI, CTA head and neck confirm bleed with mild surrounding edema -which appears to be stable - Patient continues to participate with physical therapy currently recommending placement for ongoing needs and treatment - Neurology recommending against ongoing antithrombotics, previously on aspirin  81 daily  Hypertension, improving - Downtrending after initiation of hydralazine , continue amlodipine  and metoprolol  - If blood pressure remains uncontrolled with hydralazine  would consider low-dose ARB vs spironolactone  per guidelines   Hyperlipidemia -Transition to atorvastatin  40 Diabetes type II Controlled -A1c 4.9, well-controlled Dysphagia -secondary to above, resolving  -advance diet as tolerated  Glaucoma -continue brimonidine  dorzolamide /timolol  latanopros Presumed thiamine deficiency - on thiamine per neurology  DVT prophylaxis: enoxaparin  (LOVENOX ) injection 40 mg Start: 02/16/24 1015 SCD's Start: 02/14/24 0806 Code Status:   Code Status: Full Code Family Communication: None presents  Status is: Inpatient  Dispo: The patient is from: Home              Anticipated d/c is to: SNF              Anticipated d/c date is: Imminent pending safe disposition              Patient currently is medically stable for discharge  Consultants:  Neurology  Procedures:  None  Antimicrobials:  None indicated  Subjective: No acute issues or events overnight, denies nausea vomiting diarrhea constipation headache fevers chills or chest pain  Objective: Vitals:   02/18/24 0500 02/18/24 0600 02/18/24 0618 02/18/24 0700  BP: 137/69 (!) 160/78 (!) 160/78 (!) 142/64  Pulse: 63 65  60  Resp: 18 20  19   Temp:      TempSrc:      SpO2: 97% 98%  98%  Weight:      Height:        Intake/Output Summary (Last 24 hours) at 02/18/2024 0756 Last data filed at 02/17/2024 2300 Gross per 24 hour  Intake 300 ml  Output 925 ml  Net -625 ml   Filed Weights   02/14/24 0700 02/14/24 0828  Weight: 85.2 kg 85 kg    Examination:  General:  Pleasantly resting in bed, No acute distress. HEENT: Left facial droop noted. Neck:  Without mass or deformity.  Trachea is midline. Lungs:  Clear to auscultate bilaterally without rhonchi, wheeze, or rales. Heart:  Regular rate and rhythm.  Without murmurs, rubs, or gallops.  Abdomen:  Soft, nontender, nondistended.  Without guarding or rebound. Extremities: Left upper/lower extremity 4/5.  Right side 5/5  Data Reviewed: I have personally reviewed following labs and imaging studies  CBC: Recent Labs  Lab 02/14/24 0751 02/14/24 0759 02/16/24 0536  WBC 6.4  --  7.2  NEUTROABS 3.9  --   --   HGB 15.4 17.0 16.2  HCT 46.5  50.0 46.9  MCV 90.5  --  86.5  PLT 157  --  150   Basic Metabolic Panel: Recent Labs  Lab 02/14/24 0751 02/14/24 0759 02/16/24 0536 02/17/24 0224  NA 140 144 137 138  K 4.1 4.0 3.3* 4.1  CL 108 107 105 108  CO2 27  --  20* 24  GLUCOSE 109* 107* 132* 101*  BUN 10 12 8 13   CREATININE 0.91 1.00 0.74 0.84  CALCIUM  9.2  --  9.0 8.8*   GFR: Estimated Creatinine Clearance: 89.7 mL/min (by C-G formula based on SCr of 0.84 mg/dL). Liver Function Tests: Recent Labs  Lab 02/14/24 0751  AST 55*  ALT 45*  ALKPHOS 61  BILITOT 0.7  PROT 6.7  ALBUMIN 3.5   No results for input(s): LIPASE, AMYLASE in the last 168 hours. No results for input(s): AMMONIA in the last 168 hours. Coagulation Profile: Recent Labs  Lab 02/14/24 0751  INR 1.0   Cardiac Enzymes: No results for input(s): CKTOTAL, CKMB, CKMBINDEX, TROPONINI in the last 168 hours. BNP (last 3 results) Recent Labs    08/21/23 0939  PROBNP 53.0   HbA1C: No results for input(s): HGBA1C in the last 72 hours.  CBG: Recent Labs  Lab 02/14/24 0753  GLUCAP 107*   Lipid Profile: No results for input(s): CHOL, HDL, LDLCALC, TRIG, CHOLHDL, LDLDIRECT in the last 72 hours.  Thyroid  Function Tests: No results for input(s): TSH, T4TOTAL, FREET4, T3FREE, THYROIDAB in the last 72 hours. Anemia Panel: No results for input(s): VITAMINB12, FOLATE, FERRITIN, TIBC, IRON, RETICCTPCT in the last 72 hours. Sepsis Labs: No results for input(s): PROCALCITON, LATICACIDVEN in the last 168 hours.  Recent Results (from the past 240 hours)  MRSA Next Gen by PCR, Nasal     Status: None   Collection Time: 02/14/24  5:31 PM   Specimen: Nasal Mucosa; Nasal Swab  Result Value Ref Range Status   MRSA by PCR Next Gen NOT DETECTED NOT DETECTED Final    Comment: (NOTE) The GeneXpert MRSA Assay (FDA approved for NASAL specimens only), is one component of a comprehensive MRSA colonization  surveillance program. It is not intended to diagnose MRSA infection nor to guide or monitor treatment for MRSA infections. Test performance is not FDA approved in patients less than 29 years old. Performed at [facility], [address], [place], [place] [address]          Radiology Studies: ECHOCARDIOGRAM COMPLETE Result Date: 02/16/2024    ECHOCARDIOGRAM REPORT   Patient Name:   OMARIAN JAQUITH Date of Exam: 02/16/2024 Medical Rec #:  [id number]    Height:       74.0 in Accession #:    [id number]   Weight:       187.4 lb Date of Birth:  1950-07-29     BSA:          2.114 m Patient Age:    73 years     BP:           137/84 mmHg Patient Gender: M  HR:           66 bpm. Exam Location:  Inpatient Procedure: 2D Echo, Cardiac Doppler and Color Doppler (Both Spectral and Color            Flow Doppler were utilized during procedure). Indications:    Stroke I63.9  History:        Patient has prior history of Echocardiogram examinations, most                 recent 08/21/2023. Signs/Symptoms:Hypertensive Heart Disease.  Sonographer:    Nathanel Devonshire Referring Phys: [id number] ASHISH ARORA IMPRESSIONS  1. Left ventricular ejection fraction, by estimation, is 55 to 60%. Left ventricular ejection fraction by 2D MOD biplane is 56.5 %. The left ventricle has normal function. The left ventricle has no regional wall motion abnormalities. Left ventricular diastolic parameters were normal.  2. Right ventricular systolic function is normal. The right ventricular size is normal. There is normal pulmonary artery systolic pressure.  3. The mitral valve is normal in structure. No evidence of mitral valve regurgitation. No evidence of mitral stenosis.  4. The aortic valve is normal in structure. Aortic valve regurgitation is not visualized. No aortic stenosis is present.  5. The inferior vena cava is normal in size with greater than 50% respiratory variability, suggesting right atrial pressure of 3 mmHg.  Comparison(s): No prior Echocardiogram. FINDINGS  Left Ventricle: Left ventricular ejection fraction, by estimation, is 55 to 60%. Left ventricular ejection fraction by 2D MOD biplane is 56.5 %. The left ventricle has normal function. The left ventricle has no regional wall motion abnormalities. The left ventricular internal cavity size was normal in size. There is no left ventricular hypertrophy. Left ventricular diastolic parameters were normal. Right Ventricle: The right ventricular size is normal. No increase in right ventricular wall thickness. Right ventricular systolic function is normal. There is normal pulmonary artery systolic pressure. Left Atrium: Left atrial size was normal in size. Right Atrium: Right atrial size was normal in size. Pericardium: There is no evidence of pericardial effusion. Mitral Valve: The mitral valve is normal in structure. No evidence of mitral valve regurgitation. No evidence of mitral valve stenosis. Tricuspid Valve: The tricuspid valve is normal in structure. Tricuspid valve regurgitation is not demonstrated. No evidence of tricuspid stenosis. Aortic Valve: The aortic valve is normal in structure. Aortic valve regurgitation is not visualized. No aortic stenosis is present. Aortic valve mean gradient measures 4.0 mmHg. Aortic valve peak gradient measures 7.2 mmHg. Aortic valve area, by VTI measures 3.50 cm. Pulmonic Valve: The pulmonic valve was normal in structure. Pulmonic valve regurgitation is not visualized. No evidence of pulmonic stenosis. Aorta: The aortic root is normal in size and structure. Venous: The inferior vena cava is normal in size with greater than 50% respiratory variability, suggesting right atrial pressure of 3 mmHg. IAS/Shunts: No atrial level shunt detected by color flow Doppler.  LEFT VENTRICLE PLAX 2D                        Biplane EF (MOD) LVIDd:         5.10 cm         LV Biplane EF:   Left LVIDs:         3.30 cm                          ventricular  LV PW:  1.10 cm                          ejection LV IVS:        1.00 cm                          fraction by LVOT diam:     2.30 cm                          2D MOD LV SV:         92                               biplane is LV SV Index:   43                               56.5 %. LVOT Area:     4.15 cm LV IVRT:       88 msec         Diastology                                LV e' medial:    2.83 cm/s                                LV E/e' medial:  13.0 LV Volumes (MOD)               LV e' lateral:   4.13 cm/s LV vol d, MOD    77.8 ml       LV E/e' lateral: 8.9 A2C: LV vol d, MOD    104.0 ml A4C: LV vol s, MOD    30.1 ml A2C: LV vol s, MOD    45.5 ml A4C: LV SV MOD A2C:   47.7 ml LV SV MOD A4C:   104.0 ml LV SV MOD BP:    51.4 ml RIGHT VENTRICLE             IVC RV Basal diam:  4.10 cm     IVC diam: 1.60 cm RV S prime:     11.20 cm/s TAPSE (M-mode): 1.8 cm      PULMONARY VEINS                             Diastolic Velocity: 39.40 cm/s                             S/D Velocity:       0.90                             Systolic Velocity:  36.40 cm/s LEFT ATRIUM             Index        RIGHT ATRIUM           Index LA diam:        3.10 cm 1.47 cm/m   RA Area:     13.80 cm LA Vol (A2C):   37.0 ml 17.50  ml/m  RA Volume:   37.20 ml  17.60 ml/m LA Vol (A4C):   34.9 ml 16.51 ml/m LA Biplane Vol: 38.3 ml 18.12 ml/m  AORTIC VALVE AV Area (Vmax):    2.98 cm AV Area (Vmean):   2.86 cm AV Area (VTI):     3.50 cm AV Vmax:           134.00 cm/s AV Vmean:          87.900 cm/s AV VTI:            0.262 m AV Peak Grad:      7.2 mmHg AV Mean Grad:      4.0 mmHg LVOT Vmax:         96.00 cm/s LVOT Vmean:        60.600 cm/s LVOT VTI:          0.221 m LVOT/AV VTI ratio: 0.84  AORTA Ao Root diam: 3.90 cm Ao Asc diam:  3.00 cm MITRAL VALVE MV Area (PHT): 2.56 cm     SHUNTS MV Decel Time: 296 msec     Systemic VTI:  0.22 m MV E velocity: 36.80 cm/s   Systemic Diam: 2.30 cm MV A velocity: 102.00 cm/s MV E/A ratio:  0.36 Franck  Azobou Tonleu Electronically signed by Joelle Cedars Tonleu Signature Date/Time: 02/16/2024/9:26:55 AM    Final     Scheduled Meds:  amLODipine   10 mg Oral Daily   atorvastatin   40 mg Oral Daily   brimonidine   1 drop Both Eyes BID   Chlorhexidine  Gluconate Cloth  6 each Topical Daily   dorzolamide -timolol   1 drop Both Eyes BID   enoxaparin  (LOVENOX ) injection  40 mg Subcutaneous Q24H   hydrALAZINE   25 mg Oral Q8H   latanoprost   1 drop Both Eyes QHS   metoprolol  succinate  100 mg Oral Daily   nicotine   21 mg Transdermal Daily   pantoprazole   40 mg Oral QHS   senna-docusate  1 tablet Oral BID   sodium chloride  flush  3 mL Intravenous Once   Continuous Infusions:   LOS: 4 days   Time spent:  Elsie JAYSON Montclair, DO [facility]  If 7PM-7AM, please contact night-coverage [email or web address]  02/18/2024, 7:56 AM

## 2024-02-18 NOTE — Progress Notes (Signed)
 Orthopedic Tech Progress Note Patient Details:  Filmore Molyneux 1950-09-24 [id number]  Called in order to [employer] for a RESTING HAND SPLINT   Patient ID: Maysin Carstens, male   DOB: [birth year], 73 y.o.   MRN: [id number]  Delanna LITTIE Pac 02/18/2024, 10:49 AM

## 2024-02-18 NOTE — Progress Notes (Signed)
 Physical Therapy Treatment Patient Details Name: Nathan Stafford MRN: [id number] DOB: 08-01-1950 Today's Date: 02/18/2024   History of Present Illness 73 yo M adm 12/13 sudden L side weakness. 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 PMH: HTN, HLD, glaucoma, tobacco abuse, cardiomyopathy, GERD, ETOH abuse    PT Comments  Patient progressing some in session with midline orientation.  Still feels as if falling when attempting tasks that take him past R foot BOS due to impaired body spatial awareness.  He loses L UE and at times LE when stepping or turning or standing.  Patient will needs post-acute inpatient rehab at d/c.  Noted may plan to go to SNF instead of CIR.  PT will continue to follow.    If plan is discharge home, recommend the following: A lot of help with walking and/or transfers;A lot of help with bathing/dressing/bathroom;Assist for transportation;Supervision due to cognitive status;Assistance with feeding;Assistance with cooking/housework;Direct supervision/assist for medications management;Direct supervision/assist for financial management   Can travel by private vehicle        Equipment Recommendations  Other (comment) psychologist, educational)    Recommendations for Other Services       Precautions / Restrictions Precautions Precautions: Fall Recall of Precautions/Restrictions: Impaired Precaution/Restrictions Comments: Lt sided inattention and weakness, pusher     Mobility  Bed Mobility               General bed mobility comments: in recliner    Transfers Overall transfer level: Needs assistance Equipment used: 1 person hand held assist (versus wall rail or sink in the room) Transfers: Sit to/from Stand Sit to Stand: Mod assist, Min assist           General transfer comment: lifting help to stand from  chair without device, assist from chair next to sink with min A or using rail in hallway from recliner min A    Ambulation/Gait Ambulation/Gait assistance: Mod assist, +2 safety/equipment Gait Distance (Feet): 20 Feet (x 2) Assistive device: 1 person hand held assist (wall rail in hallway) Gait Pattern/deviations: Step-to pattern, Decreased step length - left, Decreased weight shift to right, Narrow base of support       General Gait Details: initially no +2 available, walked in room with R HHA and mod A for R lateral weight shift though at times pt with poor L foot placement needing mod to max A to regain balance prior to resuming walking, landed in straight chair beside sink at the door though pt with L hand between chair and door and unable to correct with verbal cues, needing A to reposition hand and for positioning in chair; transfer to recliner and to wall rail on R in hallway ambulated another 20' with rail and mod A NT follow with chair   Stairs             Wheelchair Mobility     Tilt Bed    Modified Rankin (Stroke Patients Only) Modified Rankin (Stroke Patients Only) Pre-Morbid Rankin Score: No symptoms Modified Rankin: Moderately severe disability     Balance Overall balance assessment: Needs assistance Sitting-balance support: Feet supported Sitting balance-Leahy Scale: Poor Sitting balance - Comments: needs support due to L lateral lean and not correcting unless assisted   Standing balance support: Single extremity supported Standing balance-Leahy Scale: Poor Standing balance comment: relies on UE and external support  High Level Balance Comments: standing at sink with A for L hand on sink for weight bearing and awareness; leaning over R foot with R hand to touch back of chair, many repititions with pt twisting and still keeping much of weight on L side, till PT placed foot on outside of his R foot and assisted with R weight shift out of his  BOS; pt c/o falling            Communication Communication Communication: No apparent difficulties  Cognition Arousal: Alert Behavior During Therapy: WFL for tasks assessed/performed   PT - Cognitive impairments: Awareness, Attention, Problem solving, Safety/Judgement                       PT - Cognition Comments: decreased lt attention and awareness of deficits, poor body spatial awareness Following commands: Impaired Following commands impaired: Follows one step commands with increased time, Follows multi-step commands inconsistently    Cueing    Exercises      General Comments General comments (skin integrity, edema, etc.): VSS with mobility; kept asking for my number to be able to let me know if he can come to therapy next week; re-oriented multiple times he is in the hospital and will still be here or in rehab next week.      Pertinent Vitals/Pain Pain Assessment Pain Assessment: No/denies pain    Home Living                          Prior Function            PT Goals (current goals can now be found in the care plan section) Progress towards PT goals: Progressing toward goals    Frequency           PT Plan      Co-evaluation              AM-PAC PT 6 Clicks Mobility   Outcome Measure  Help needed turning from your back to your side while in a flat bed without using bedrails?: A Little Help needed moving from lying on your back to sitting on the side of a flat bed without using bedrails?: A Little Help needed moving to and from a bed to a chair (including a wheelchair)?: A Lot Help needed standing up from a chair using your arms (e.g., wheelchair or bedside chair)?: A Lot Help needed to walk in hospital room?: Total Help needed climbing 3-5 steps with a railing? : Total 6 Click Score: 12    End of Session Equipment Utilized During Treatment: Gait belt Activity Tolerance: Patient tolerated treatment well Patient left: in  chair;with call bell/phone within reach;with chair alarm set Nurse Communication: Mobility status PT Visit Diagnosis: Difficulty in walking, not elsewhere classified (R26.2);Hemiplegia and hemiparesis;Muscle weakness (generalized) (M62.81) Hemiplegia - Right/Left: Left Hemiplegia - dominant/non-dominant: Non-dominant Hemiplegia - caused by: Nontraumatic intracerebral hemorrhage     Time: 1412-1440 PT Time Calculation (min) (ACUTE ONLY): 28 min  Charges:    $Gait Training: 8-22 mins $Neuromuscular Re-education: 8-22 mins PT General Charges $$ ACUTE PT VISIT: 1 Visit                     Micheline Portal, PT Acute Rehabilitation Services Office:[phone] 02/18/2024    Montie Portal 02/18/2024, 4:14 PM

## 2024-02-18 NOTE — TOC Progression Note (Addendum)
 Transition of Care [facility]) - Progression Note    Patient Details  Name: Nathan Stafford MRN: [id number] Date of Birth: [birth year]  [facility]) CM/SW Contact  Tauno Falotico E Malayjah Otoole, LCSW Phone Number: 02/18/2024, 10:35 AM  Clinical Narrative:    Continuing to follow for CIR eligibility - depending upon family support post discharge per CIR Representative.   CSW called patient's son Nathan Stafford to follow up about supervision post DC and SNF as a back up plan. Nathan Stafford states patient does have someone to stay with him overnight and sister can likely stay with him during the day, he is just waiting to hear back. Upon further discussion about CIR versus SNF, Nathan Stafford states they prefer SNF for STR. Will complete SNF work up. Nathan Stafford states he would also like to speak to Financial Counseling regarding applying for Medicaid for patient - referral resent to [employer].                      Expected Discharge Plan and Services                                               Social Drivers of Health (SDOH) Interventions SDOH Screenings   Food Insecurity: Food Insecurity Present (02/16/2024)  Housing: Low Risk (02/16/2024)  Transportation Needs: No Transportation Needs (02/16/2024)  Utilities: Not At Risk (02/16/2024)  Alcohol Screen: Low Risk (07/30/2023)  Depression (PHQ2-9): Low Risk (07/30/2023)  Financial Resource Strain: Medium Risk (07/30/2023)  Physical Activity: Sufficiently Active (07/30/2023)  Social Connections: Socially Isolated (02/16/2024)  Stress: No Stress Concern Present (07/30/2023)  Tobacco Use: High Risk (02/14/2024)  Health Literacy: Adequate Health Literacy (07/30/2023)    Readmission Risk Interventions     No data to display

## 2024-02-18 NOTE — Progress Notes (Signed)
 Occupational Therapy Treatment Patient Details Name: Nathan Stafford MRN: [id number] DOB: Jul 25, 1950 Today's Date: 02/18/2024   History of present illness 73 yo M adm 12/13 sudden L side weakness. 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 PMH: HTN, HLD, glaucoma, tobacco abuse, cardiomyopathy, GERD, ETOH abuse   OT comments  Pt supine in bed and agreeable to OT.  Eager for mobility and asking if he can go home today.  Pt educated on deficits and safety, pt voiced understanding but still demonstrates some decreased awareness.  He requires min assist for stand pivot to recliner, max assist for LB dressing from recliner demonstrating poor dynamic sitting balance and problem solving.  He engaged in exercises to L UE, noted some increased tone in elbow flexors, wrist and hand- worked on prayer stretch and order resting hand splint for night time use (Dr. Lue perks this).  Will follow acutely, continue to recommend >3hrs/day inpatient setting at dc.  Will follow acutely.       If plan is discharge home, recommend the following:  Assistance with cooking/housework;Assist for transportation;Help with stairs or ramp for entrance;Direct supervision/assist for financial management;Direct supervision/assist for medications management;A lot of help with walking and/or transfers;A lot of help with bathing/dressing/bathroom;Supervision due to cognitive status   Equipment Recommendations  Other (comment) (defer)    Recommendations for Other Services Rehab consult    Precautions / Restrictions Precautions Precautions: Fall;Other (comment) Recall of Precautions/Restrictions: Impaired Precaution/Restrictions Comments: Lt sided inattention and weakness Restrictions Weight Bearing Restrictions Per Provider Order: No       Mobility Bed  Mobility Overal bed mobility: Needs Assistance Bed Mobility: Supine to Sit     Supine to sit: Min assist, HOB elevated     General bed mobility comments: hand held assist to elevate trunk    Transfers Overall transfer level: Needs assistance Equipment used: 1 person hand held assist Transfers: Sit to/from Stand, Bed to chair/wheelchair/BSC Sit to Stand: Min assist Stand pivot transfers: Min assist         General transfer comment: min assist to fully ascend and pivot towards R side, pt reaching with RUE to recliner for increased support     Balance Overall balance assessment: Needs assistance Sitting-balance support: Feet supported, Single extremity supported Sitting balance-Leahy Scale: Fair Sitting balance - Comments: L lateral lean but able to correct with cueing. dynamically needs min assist   Standing balance support: During functional activity, Single extremity supported Standing balance-Leahy Scale: Fair Standing balance comment: relies on UE and external support                           ADL either performed or assessed with clinical judgement   ADL Overall ADL's : Needs assistance/impaired     Grooming: Minimal assistance;Sitting           Upper Body Dressing : Sitting;Minimal assistance   Lower Body Dressing: Maximal assistance;Sitting/lateral leans Lower Body Dressing Details (indicate cue type and reason): requires assist to start socks but able to pull up after therapist initated donning.  needing up to min assist for dynamic sitting balance. Toilet Transfer: Minimal Cabin Crew Details (indicate cue type and reason): to recliner towards R side         Functional mobility during ADLs: Minimal assistance;Cueing for safety;Cueing for sequencing      Extremity/Trunk  Assessment Upper Extremity Assessment Upper Extremity Assessment: LUE deficits/detail LUE Deficits / Details: grossly 3-/5 MMT, more difficulty  with [facility] of wrist and hand.  poor coordination and sensation.  able to bring to face for adls. tone/tightness in elbow flexors and wrist, ordered resting hand splint with ok from Dr. Lue LUE Sensation: decreased light touch;decreased proprioception LUE Coordination: decreased fine motor;decreased gross motor            Vision       Perception     Praxis     Communication Communication Communication: No apparent difficulties   Cognition Arousal: Alert Behavior During Therapy: WFL for tasks assessed/performed, Impulsive Cognition: Cognition impaired     Awareness: Intellectual awareness intact, Online awareness impaired   Attention impairment (select first level of impairment): Sustained attention Executive functioning impairment (select all impairments): Sequencing, Reasoning, Initiation, Organization, Problem solving OT - Cognition Comments: pt oriented, does have decreased awareness and probelm sovling. voicing do you think I can go home today? demonstrating poor understanding of deficits and safety.  He is slightly impulsive but with cueing able to redirect.                 Following commands: Intact Following commands impaired: Only follows one step commands consistently      Cueing   Cueing Techniques: Verbal cues, Gestural cues, Tactile cues  Exercises Exercises: Other exercises Other Exercises Other Exercises: AAROM of L UE from shoulder to hand, x 6 reps in all planes. Pt using compensatory movements and difficulty with isolated movements of scapula, forearm, wrist and hand.  Tends to move very quickly with poor coordination.    Shoulder Instructions       General Comments VSS    Pertinent Vitals/ Pain       Pain Assessment Pain Assessment: No/denies pain  Home Living                                          Prior Functioning/Environment              Frequency  Min 2X/week        Progress Toward Goals  OT  Goals(current goals can now be found in the care plan section)  Progress towards OT goals: Progressing toward goals  Acute Rehab OT Goals Patient Stated Goal: get better OT Goal Formulation: With patient Time For Goal Achievement: 03/01/24 Potential to Achieve Goals: Good  Plan      Co-evaluation                 AM-PAC OT 6 Clicks Daily Activity     Outcome Measure   Help from another person eating meals?: A Little Help from another person taking care of personal grooming?: A Little Help from another person toileting, which includes using toliet, bedpan, or urinal?: Total Help from another person bathing (including washing, rinsing, drying)?: Total Help from another person to put on and taking off regular upper body clothing?: A Little Help from another person to put on and taking off regular lower body clothing?: A Lot 6 Click Score: 13    End of Session Equipment Utilized During Treatment: Gait belt  OT Visit Diagnosis: Other abnormalities of gait and mobility (R26.89);Hemiplegia and hemiparesis;Other symptoms and signs involving the nervous system (R29.898) Hemiplegia - Right/Left: Left Hemiplegia - dominant/non-dominant: Non-Dominant   Activity Tolerance Patient tolerated treatment well  Patient Left in chair;with call bell/phone within reach;with chair alarm set   Nurse Communication Mobility status;Precautions        Time: [phone] OT Time Calculation (min): 35 min  Charges: OT General Charges $OT Visit: 1 Visit OT Treatments $Self Care/Home Management : 8-22 mins $Neuromuscular Re-education: 8-22 mins  Etta NOVAK, OT Acute Rehabilitation Services Office [phone] Secure Chat Preferred    Etta GORMAN [place] 02/18/2024, 12:08 PM

## 2024-02-18 NOTE — Progress Notes (Addendum)
 STROKE TEAM PROGRESS NOTE   INTERIM HISTORY/SUBJECTIVE  Vital signs have been stable. There were no acute events overnight. No ativan  has been required. Patient's L grip strength is improving.  OBJECTIVE  CBC    Component Value Date/Time   WBC 7.2 02/16/2024 0536   RBC 5.42 02/16/2024 0536   HGB 16.2 02/16/2024 0536   HGB 14.9 05/09/2017 1011   HCT 46.9 02/16/2024 0536   HCT 44.5 05/09/2017 1011   PLT 150 02/16/2024 0536   PLT 173 05/09/2017 1011   MCV 86.5 02/16/2024 0536   MCV 87 05/09/2017 1011   MCH 29.9 02/16/2024 0536   MCHC 34.5 02/16/2024 0536   RDW 14.4 02/16/2024 0536   RDW 15.7 (H) 05/09/2017 1011   LYMPHSABS 1.6 02/14/2024 0751   MONOABS 0.8 02/14/2024 0751   EOSABS 0.0 02/14/2024 0751   BASOSABS 0.0 02/14/2024 0751    BMET    Component Value Date/Time   NA 138 02/17/2024 0224   NA 142 05/11/2020 0944   K 4.1 02/17/2024 0224   CL 108 02/17/2024 0224   CO2 24 02/17/2024 0224   GLUCOSE 101 (H) 02/17/2024 0224   BUN 13 02/17/2024 0224   BUN 9 05/11/2020 0944   CREATININE 0.84 02/17/2024 0224   CREATININE 1.03 04/26/2015 1440   CALCIUM  8.8 (L) 02/17/2024 0224   EGFR 91 05/11/2020 0944   GFRNONAA >60 02/17/2024 0224   GFRNONAA 76 04/26/2015 1440    IMAGING past 24 hours No results found.   Vitals:   02/18/24 1000 02/18/24 1100 02/18/24 1200 02/18/24 1300  BP: (!) 164/85 (!) 145/75 (!) 157/74 (!) 130/45  Pulse: 68 70 66 75  Resp: 16 (!) 23 17 19   Temp:   98.1 F (36.7 C)   TempSrc:   Oral   SpO2: 99% 99% 98% 98%  Weight:      Height:         PHYSICAL EXAM General:  Alert, well-nourished, well-developed patient in no acute distress Psych:  Mood and affect appropriate for situation CV: Regular rate and rhythm on monitor Respiratory:  Regular, unlabored respirations on room air GI: Abdomen soft and nontender   NEURO:  Mental Status: AA&Ox3, patient is unsure why he is in the hospital.  Speech/Language: speech is without dysarthria or  aphasia.  Naming, repetition, fluency, and comprehension intact.  Cranial Nerves:  II: PERRL. Visual fields full.  III, IV, VI: EOMI. Eyelids elevate symmetrically.  V: Sensation is intact to light touch and symmetrical to face.  VII: Left facial droop VIII: hearing intact to voice. IX, X: Palate elevates symmetrically. Phonation is normal.  KP:Dynloizm shrug 5/5. XII: tongue is midline without fasciculations. Motor:  LUE 4/5 LLE mild drift 4/5 Tone: is normal and bulk is normal Sensation- Intact to light touch bilaterally.  Mild left Hemi inattention and extinction to light touch to DSS.   Coordination: FTN no ataxia out of proportion to weakness, HKS: no ataxia in BLE.No drift.  Gait- deferred   ASSESSMENT/PLAN  [name] is a 73 y.o. male with history of HTN, HLD, glaucoma, tobacco use admitted for left sided weakness, left facial droop, slurred speech.  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  CTA head & neck Acute right perirolandic intracerebral hemorrhage  without CTA spot sign  MRI right frontoparietal parenchymal hematoma with mild surrounding edema and small volume adjacent subarachnoid hemorrhage 2D Echo: 55-60%, atria normal in size.  LDL 97 HgbA1c 4.9 VTE prophylaxis - SCDs aspirin  81 mg daily prior to admission, now on No antithrombotic  Therapy recommendations: PT: SNF   OT: SNF Disposition: per primary, likely to SNF based on PT/OT  Hypertension Home meds:  metoprolol  succinate, aldactone  (not taking aldactone ) Stable Blood pressure goal less 160 Cleviprex  infusing PRN hydralazine  and labetaolol Resume metoprolol , add norvasc   Hyperlipidemia Home meds:   Simvastatin  20mg , resumed in hospital LDL 97, goal < 70 Continue statin at discharge  Diabetes type II Controlled HgbA1c 4.9, goal < 7.0  Dysphagia Patient has post-stroke dysphagia, SLP consulted    Diet   Diet heart healthy/carb modified Room service appropriate? Yes with Assist; Fluid consistency: Thin  Advance diet as tolerated  Other Stroke Risk Factors ETOH use, alcohol level <15, advised to drink no more than 2 drink(s) a day  Alcohol use Monitoring CIWA, can spot dose Ativan  as needed. If pt becomes agitated, would prefer Depakote 500-1000mg  over a more sedating medication.  Glaucoma Home drops resumed  Thiamine deficiency No previous labs On supplement at home Level ordered Family notes a progressive cognitive decline/memory issues consistent with the micro hemorrhages seen on MRI   Hospital day # 4  Pt is stable from a neurologic perspective, neurology will be signing off. Please reach out if there are any concerns.  CHARM Nathan Mori, DO, PGY1 Neurology Stroke Team 02/18/2024 1:43 PM Patient remains intermittently agitated and is on alcohol withdrawal precautions.  Continue strict blood pressure control with systolic goal below 160.  Mobilize out of bed.  Therapy consult.  Transfer out of ICU to floor bed and to skilled nursing facility for rehab.  No family at the bedside.  Discussed with Dr. Lue.  Stroke team will sign off.  Kindly call for questions.  Nathan Popp, MD To contact Stroke Continuity provider, please refer to [email or web address]. After hours, contact General Neurology

## 2024-02-19 DIAGNOSIS — I619 Nontraumatic intracerebral hemorrhage, unspecified: Secondary | ICD-10-CM | POA: Diagnosis not present

## 2024-02-19 DIAGNOSIS — R1310 Dysphagia, unspecified: Secondary | ICD-10-CM

## 2024-02-19 DIAGNOSIS — H409 Unspecified glaucoma: Secondary | ICD-10-CM | POA: Diagnosis not present

## 2024-02-19 DIAGNOSIS — R7303 Prediabetes: Secondary | ICD-10-CM

## 2024-02-19 DIAGNOSIS — E785 Hyperlipidemia, unspecified: Secondary | ICD-10-CM | POA: Diagnosis not present

## 2024-02-19 DIAGNOSIS — I10 Essential (primary) hypertension: Secondary | ICD-10-CM | POA: Diagnosis not present

## 2024-02-19 LAB — VITAMIN B1: Vitamin B1 (Thiamine): 115.8 nmol/L (ref 66.5–200.0)

## 2024-02-19 MED ORDER — HYDRALAZINE HCL 50 MG PO TABS
50.0000 mg | ORAL_TABLET | Freq: Three times a day (TID) | ORAL | Status: DC
  Administered 2024-02-19 – 2024-02-20 (×3): 50 mg via ORAL
  Filled 2024-02-19 (×3): qty 1

## 2024-02-19 NOTE — Progress Notes (Signed)
 PROGRESS NOTE    Nathan Stafford  FMW:[id number] DOB: [birth year] DOA: 02/14/2024 PCP: Joshua Debby CROME, MD    Chief Complaint  Patient presents with   Code Stroke    Brief Narrative:  Nathan Stafford is a 73 y.o. male with hx of hypertension, hyperlipidemia, glaucoma, tobacco abuse, presenting to the emergency department for evaluation of sudden onset of left-sided weakness.  Patient noted to have left facial droop left-sided weakness and slurred speech NIH stroke scale of 8.  Imaging at that time indicated intraparenchymal hemorrhage with subarachnoid hemorrhage, right.  Neurology called for admission.   Patient transitioned to TRH service on 12/16 given improvement in symptoms, concern over possible amyloid process as etiology for noted bleed.  Echo completed per protocol without worrisome findings, remainder of patient's labs appear to be within normal limits.  Patient continues to have ongoing deficits, at this time he is being evaluated for ongoing physical therapy with plan for ultimate discharge home once he has recovered from his injury.   Assessment & Plan:   Principal Problem:   Hemorrhagic stroke (HCC) Active Problems:   HTN (hypertension)   Prediabetes   Dyslipidemia, goal LDL below 70   Dysphagia   Glaucoma   1 intraparenchymal hemorrhage and subarachnoid hemorrhage, right, POA -Patient initially admitted to the neurology team as he had presented with left-sided weakness. -CT head done on admission with acute hemorrhage in the superior right perirolandic area, suspected both intra axial hematoma estimated 5 mm and adjacent subarachnoid blood. - CT angiogram head and neck down with acute right perirolandic intracerebral hemorrhage without CTA spot sign. -MRI brain done right frontoparietal parenchymal hematoma with mild surrounding edema and small volume adjacent subarachnoid hemorrhage. -2D echo done with EF of 55 to 60%,NWMA, normal right ventricular systolic function,  normal right ventricular size, normal mitral valve and structure, no mitral stenosis or MVR.  Normal aortic valve structure.  No aortic stenosis.. - LDL of 97 - Hemoglobin A1c 4.9. - Neurology initially suspecting CVA as etiology based on imaging and risk factors. - Patient noted to be on aspirin  prior to admission. - Neurology recommending against going on antithrombotics. -Continue Lipitor 40 mg daily - PT/OT. - Likely needs SNF placement. - Neurology following.  2.  Hypertension  -BP improving and downtrending after initiation of hydralazine . - Continue Norvasc  10 mg daily, Toprol -XL 100 mg daily. - Increase hydralazine  to 50 mg every 8 hours. - If blood pressure remains uncontrolled despite hydralazine  we will consider addition of low-dose ARB versus spironolactone  per guidelines..   3.  Hyperlipidemia -Continue Lipitor 40 mg daily.  4.  Dysphagia -Secondary to problem #1 -Improving - Advance diet as tolerated.  5.  Well-controlled type 2 diabetes -Hemoglobin A1c 4.9 (02/14/2024). - Outpatient follow-up.  6. glaucoma -Continue brimonidine  dorzolamide /timolol  lantanopros  7.  Presumed thiamine deficiency -On thiamine per neurology.   DVT prophylaxis: Lovenox  Code Status: Full Family Communication: Updated patient, sister at bedside, pastor at bedside. Disposition: SNF  Status is: Inpatient Remains inpatient appropriate because: Severity of illness   Consultants:  Admission to neurology/stroke team  Procedures:  MRI brain 02/14/2024 2D echo 02/16/2024 CT head 02/14/2024 CT angiogram head and neck 02/14/2024  Antimicrobials:  Anti-infectives (From admission, onward)    None         Subjective: Patient laying in bed.  Alert to self and place.  Patient denies any chest pain, no shortness of breath, no abdominal pain.  Feels some improvement with left-sided weakness.  Daughter and pastor  at bedside.    Objective: Vitals:   02/19/24 1500 02/19/24 1515  02/19/24 1544 02/19/24 1724  BP: (!) 148/72  (!) 159/88 (!) 143/77  Pulse: 77 76 79 84  Resp: (!) 25 16 (!) 23 18  Temp:  (!) 97.4 F (36.3 C)  97.9 F (36.6 C)  TempSrc:  Oral  Oral  SpO2: 98% 98% 98% 100%  Weight:      Height:        Intake/Output Summary (Last 24 hours) at 02/19/2024 1725 Last data filed at 02/19/2024 1500 Gross per 24 hour  Intake --  Output 1300 ml  Net -1300 ml   Filed Weights   02/14/24 0700 02/14/24 0828  Weight: 85.2 kg 85 kg    Examination:  General exam: Appears calm and comfortable  Respiratory system: Clear to auscultation. Respiratory effort normal. Cardiovascular system: S1 & S2 heard, RRR. No JVD, murmurs, rubs, gallops or clicks. No pedal edema. Gastrointestinal system: Abdomen is nondistended, soft and nontender. No organomegaly or masses felt. Normal bowel sounds heard. Central nervous system: Alert and oriented.  4/5 left lower extremity strength.  5/5 right lower extremity strength and right upper extremity.  3/5 left upper extremity strength.  Improved grip strength.  Extremities: 4/5 left lower extremity strength.  5/5 right upper and right lower extremity strength.  3/5 left upper extremity strength. Skin: No rashes, lesions or ulcers Psychiatry: Judgement and insight appear normal. Mood & affect appropriate.     Data Reviewed: I have personally reviewed following labs and imaging studies  CBC: Recent Labs  Lab 02/14/24 0751 02/14/24 0759 02/16/24 0536  WBC 6.4  --  7.2  NEUTROABS 3.9  --   --   HGB 15.4 17.0 16.2  HCT 46.5 50.0 46.9  MCV 90.5  --  86.5  PLT 157  --  150    Basic Metabolic Panel: Recent Labs  Lab 02/14/24 0751 02/14/24 0759 02/16/24 0536 02/17/24 0224  NA 140 144 137 138  K 4.1 4.0 3.3* 4.1  CL 108 107 105 108  CO2 27  --  20* 24  GLUCOSE 109* 107* 132* 101*  BUN 10 12 8 13   CREATININE 0.91 1.00 0.74 0.84  CALCIUM  9.2  --  9.0 8.8*    GFR: Estimated Creatinine Clearance: 89.7 mL/min (by  C-G formula based on SCr of 0.84 mg/dL).  Liver Function Tests: Recent Labs  Lab 02/14/24 0751  AST 55*  ALT 45*  ALKPHOS 61  BILITOT 0.7  PROT 6.7  ALBUMIN 3.5    CBG: Recent Labs  Lab 02/14/24 0753  GLUCAP 107*     Recent Results (from the past 240 hours)  MRSA Next Gen by PCR, Nasal     Status: None   Collection Time: 02/14/24  5:31 PM   Specimen: Nasal Mucosa; Nasal Swab  Result Value Ref Range Status   MRSA by PCR Next Gen NOT DETECTED NOT DETECTED Final    Comment: (NOTE) The GeneXpert MRSA Assay (FDA approved for NASAL specimens only), is one component of a comprehensive MRSA colonization surveillance program. It is not intended to diagnose MRSA infection nor to guide or monitor treatment for MRSA infections. Test performance is not FDA approved in patients less than 31 years old. Performed at [facility], [address] [place], [place] [address]          Radiology Studies: No results found.      Scheduled Meds:  amLODipine   10 mg Oral Daily  atorvastatin   40 mg Oral Daily   brimonidine   1 drop Both Eyes BID   Chlorhexidine  Gluconate Cloth  6 each Topical Daily   dorzolamide -timolol   1 drop Both Eyes BID   enoxaparin  (LOVENOX ) injection  40 mg Subcutaneous Q24H   hydrALAZINE   50 mg Oral Q8H   latanoprost   1 drop Both Eyes QHS   metoprolol  succinate  100 mg Oral Daily   nicotine   21 mg Transdermal Daily   pantoprazole   40 mg Oral QHS   senna-docusate  1 tablet Oral BID   sodium chloride  flush  3 mL Intravenous Once   Continuous Infusions:   LOS: 5 days    Time spent: 40 minutes    Toribio Hummer, MD [facility]   To contact the attending provider between 7A-7P or the covering provider during after hours 7P-7A, please log into the web site [email or web address] and access using universal [place] password for that web site. If you do not have the password, please call the hospital operator.  02/19/2024, 5:25 PM

## 2024-02-19 NOTE — Plan of Care (Signed)
°  Problem: Education: Goal: Knowledge of disease or condition will improve Outcome: Progressing   Problem: Intracerebral Hemorrhage Tissue Perfusion: Goal: Complications of Intracerebral Hemorrhage will be minimized Outcome: Progressing   Problem: Health Behavior/Discharge Planning: Goal: Goals will be collaboratively established with patient/family Outcome: Progressing   Problem: Nutrition: Goal: Risk of aspiration will decrease Outcome: Progressing Goal: Dietary intake will improve Outcome: Progressing   Problem: Clinical Measurements: Goal: Ability to maintain clinical measurements within normal limits will improve Outcome: Progressing Goal: Diagnostic test results will improve Outcome: Progressing Goal: Respiratory complications will improve Outcome: Progressing Goal: Cardiovascular complication will be avoided Outcome: Progressing   Problem: Activity: Goal: Risk for activity intolerance will decrease Outcome: Progressing   Problem: Nutrition: Goal: Adequate nutrition will be maintained Outcome: Progressing   Problem: Education: Goal: Knowledge of disease or condition will improve Outcome: Progressing   Problem: Intracerebral Hemorrhage Tissue Perfusion: Goal: Complications of Intracerebral Hemorrhage will be minimized Outcome: Progressing   Problem: Health Behavior/Discharge Planning: Goal: Goals will be collaboratively established with patient/family Outcome: Progressing   Problem: Nutrition: Goal: Risk of aspiration will decrease Outcome: Progressing Goal: Dietary intake will improve Outcome: Progressing   Problem: Clinical Measurements: Goal: Ability to maintain clinical measurements within normal limits will improve Outcome: Progressing Goal: Diagnostic test results will improve Outcome: Progressing Goal: Respiratory complications will improve Outcome: Progressing Goal: Cardiovascular complication will be avoided Outcome: Progressing   Problem:  Activity: Goal: Risk for activity intolerance will decrease Outcome: Progressing   Problem: Nutrition: Goal: Adequate nutrition will be maintained Outcome: Progressing   Problem: Education: Goal: Knowledge of secondary prevention will improve (MUST DOCUMENT ALL) Outcome: Not Progressing   Problem: Health Behavior/Discharge Planning: Goal: Ability to manage health-related needs will improve Outcome: Not Progressing   Problem: Self-Care: Goal: Ability to participate in self-care as condition permits will improve Outcome: Not Progressing Goal: Ability to communicate needs accurately will improve Outcome: Not Progressing   Problem: Education: Goal: Knowledge of General Education information will improve Description: Including pain rating scale, medication(s)/side effects and non-pharmacologic comfort measures Outcome: Not Progressing   Problem: Health Behavior/Discharge Planning: Goal: Ability to manage health-related needs will improve Outcome: Not Progressing   Problem: Coping: Goal: Level of anxiety will decrease Outcome: Not Progressing

## 2024-02-19 NOTE — TOC Progression Note (Signed)
 Transition of Care [facility]) - Progression Note    Patient Details  Name: Nathan Stafford MRN: [id number] Date of Birth: 1950/09/11  Transition of Care [facility]) CM/SW Contact  Lexa Coronado E Shivangi Lutz, LCSW Phone Number: 02/19/2024, 1:16 PM  Clinical Narrative:    CSW sent list of SNF bed offers with Medicare ratings to patient's son to review for choice. Will need insurance auth.                      Expected Discharge Plan and Services                                               Social Drivers of Health (SDOH) Interventions SDOH Screenings   Food Insecurity: Food Insecurity Present (02/16/2024)  Housing: Low Risk (02/16/2024)  Transportation Needs: No Transportation Needs (02/16/2024)  Utilities: Not At Risk (02/16/2024)  Alcohol Screen: Low Risk (07/30/2023)  Depression (PHQ2-9): Low Risk (07/30/2023)  Financial Resource Strain: Medium Risk (07/30/2023)  Physical Activity: Sufficiently Active (07/30/2023)  Social Connections: Socially Isolated (02/16/2024)  Stress: No Stress Concern Present (07/30/2023)  Tobacco Use: High Risk (02/14/2024)  Health Literacy: Adequate Health Literacy (07/30/2023)    Readmission Risk Interventions     No data to display

## 2024-02-19 NOTE — Progress Notes (Signed)
 Physical Therapy Treatment Patient Details Name: Nathan Stafford MRN: [id number] DOB: [birth year] Today's Date: 02/19/2024   History of Present Illness 73 yo M adm 12/13 sudden L side weakness. 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 PMH: HTN, HLD, glaucoma, tobacco abuse, cardiomyopathy, GERD, ETOH abuse    PT Comments  Pt pleasant with sister visiting during session. Pt with increased confusion and left lean from last session with this therapist 12/16. Pt needing mod-max assist for stepping and balance this date with +2 greatly helpful to progress mobility but not available this date. Pt up to chair end of session and required pillow positioning for midline due to left lean. Poor spatial awareness this date. Patient will benefit from continued inpatient follow up therapy, <3 hours/day      If plan is discharge home, recommend the following: A lot of help with walking and/or transfers;A lot of help with bathing/dressing/bathroom;Assist for transportation;Supervision due to cognitive status;Assistance with feeding;Assistance with cooking/housework;Direct supervision/assist for medications management;Direct supervision/assist for financial management   Can travel by private vehicle     No  Equipment Recommendations  Other (comment) (hemiwalker)    Recommendations for Other Services       Precautions / Restrictions Precautions Precautions: Fall Recall of Precautions/Restrictions: Impaired Precaution/Restrictions Comments: Lt sided inattention and weakness, pusher     Mobility  Bed Mobility Overal bed mobility: Needs Assistance Bed Mobility: Supine to Sit     Supine to sit: Min assist, HOB elevated, Used rails     General bed mobility comments: min assist with cues to use RLE to hook and move LLE to EOB, min assist to  elevate trunk from surface with heavy left lean this date only able to sit with CGA if Rt hand grasping foot board    Transfers Overall transfer level: Needs assistance   Transfers: Sit to/from Stand Sit to Stand: Min assist, Mod assist           General transfer comment: min assist to rise from bed, mod assist to rise from chair with left knee blocked and cues for rail use on Rt to rise. Pt needing multimodal cues and assist for anterior translation; RUE support of therapist for pivot bed to chair    Ambulation/Gait Ambulation/Gait assistance: Mod assist, Max assist Gait Distance (Feet): 20 Feet Assistive device: 1 person hand held assist Gait Pattern/deviations: Step-to pattern, Decreased step length - left, Decreased weight shift to right, Narrow base of support   Gait velocity interpretation: <1.31 ft/sec, indicative of household ambulator   General Gait Details: pt with rail on Rt in hall way with mod cues and mod assist for sequence and weight shift. Pt with anterior left lean throughout gait needing cues to step with LLE and weight shift. Pt then sidestepping to Rt to return to recliner with max assist for weight shift, moving LLE and max cues for sequence. Increased pushing to Lt with sidestepping Rt   Stairs             Wheelchair Mobility     Tilt Bed    Modified Rankin (Stroke Patients Only) Modified Rankin (Stroke Patients Only) Pre-Morbid Rankin Score: No symptoms Modified Rankin: Moderately severe disability     Balance Overall balance assessment: Needs assistance Sitting-balance support: Feet supported Sitting balance-Leahy Scale: Poor Sitting balance - Comments: Lt lean   Standing balance support: Single extremity  supported Standing balance-Leahy Scale: Poor Standing balance comment: relies on UE and external support                            Communication Communication Communication: No apparent difficulties  Cognition Arousal:  Alert Behavior During Therapy: WFL for tasks assessed/performed   PT - Cognitive impairments: Awareness, Attention, Problem solving, Safety/Judgement, Orientation, Initiation, Memory                       PT - Cognition Comments: decreased lt attention and awareness of deficits, poor body spatial awareness, not oriented to time, not recalling prior session, increased confusion from prior session Following commands: Impaired Following commands impaired: Follows one step commands with increased time, Follows multi-step commands inconsistently    Cueing Cueing Techniques: Verbal cues, Gestural cues, Tactile cues  Exercises General Exercises - Lower Extremity Long Arc Quad: AROM, Left, Seated, Strengthening, 10 reps    General Comments        Pertinent Vitals/Pain Pain Assessment Pain Assessment: No/denies pain    Home Living                          Prior Function            PT Goals (current goals can now be found in the care plan section) Progress towards PT goals: Progressing toward goals    Frequency    Min 2X/week      PT Plan      Co-evaluation              AM-PAC PT 6 Clicks Mobility   Outcome Measure  Help needed turning from your back to your side while in a flat bed without using bedrails?: A Little Help needed moving from lying on your back to sitting on the side of a flat bed without using bedrails?: A Little Help needed moving to and from a bed to a chair (including a wheelchair)?: A Lot Help needed standing up from a chair using your arms (e.g., wheelchair or bedside chair)?: A Lot Help needed to walk in hospital room?: Total Help needed climbing 3-5 steps with a railing? : Total 6 Click Score: 12    End of Session Equipment Utilized During Treatment: Gait belt Activity Tolerance: Patient tolerated treatment well Patient left: in chair;with call bell/phone within reach;with chair alarm set;with family/visitor  present Nurse Communication: Mobility status PT Visit Diagnosis: Difficulty in walking, not elsewhere classified (R26.2);Hemiplegia and hemiparesis;Muscle weakness (generalized) (M62.81) Hemiplegia - Right/Left: Left Hemiplegia - dominant/non-dominant: Non-dominant Hemiplegia - caused by: Nontraumatic intracerebral hemorrhage     Time: 1250-1322 PT Time Calculation (min) (ACUTE ONLY): 32 min  Charges:    $Gait Training: 8-22 mins $Therapeutic Activity: 8-22 mins PT General Charges $$ ACUTE PT VISIT: 1 Visit                     Lenoard SQUIBB, PT Acute Rehabilitation Services Office: [phone]    Lenoard NOVAK Nolan Tuazon 02/19/2024, 1:45 PM

## 2024-02-20 DIAGNOSIS — I619 Nontraumatic intracerebral hemorrhage, unspecified: Secondary | ICD-10-CM | POA: Diagnosis not present

## 2024-02-20 DIAGNOSIS — K5900 Constipation, unspecified: Secondary | ICD-10-CM

## 2024-02-20 DIAGNOSIS — R1310 Dysphagia, unspecified: Secondary | ICD-10-CM | POA: Diagnosis not present

## 2024-02-20 DIAGNOSIS — H409 Unspecified glaucoma: Secondary | ICD-10-CM | POA: Diagnosis not present

## 2024-02-20 DIAGNOSIS — E785 Hyperlipidemia, unspecified: Secondary | ICD-10-CM | POA: Diagnosis not present

## 2024-02-20 LAB — CBC
HCT: 48.3 % (ref 39.0–52.0)
Hemoglobin: 16.2 g/dL (ref 13.0–17.0)
MCH: 29.5 pg (ref 26.0–34.0)
MCHC: 33.5 g/dL (ref 30.0–36.0)
MCV: 87.8 fL (ref 80.0–100.0)
Platelets: ADEQUATE K/uL (ref 150–400)
RBC: 5.5 MIL/uL (ref 4.22–5.81)
RDW: 14 % (ref 11.5–15.5)
WBC: 7 K/uL (ref 4.0–10.5)
nRBC: 0 % (ref 0.0–0.2)

## 2024-02-20 LAB — BASIC METABOLIC PANEL WITH GFR
Anion gap: 12 (ref 5–15)
BUN: 12 mg/dL (ref 8–23)
CO2: 19 mmol/L — ABNORMAL LOW (ref 22–32)
Calcium: 9.5 mg/dL (ref 8.9–10.3)
Chloride: 106 mmol/L (ref 98–111)
Creatinine, Ser: 0.79 mg/dL (ref 0.61–1.24)
GFR, Estimated: >60 mL/min
Glucose, Bld: 107 mg/dL — ABNORMAL HIGH (ref 70–99)
Potassium: 3.8 mmol/L (ref 3.5–5.1)
Sodium: 136 mmol/L (ref 135–145)

## 2024-02-20 LAB — MAGNESIUM: Magnesium: 2 mg/dL (ref 1.7–2.4)

## 2024-02-20 MED ORDER — SORBITOL 70 % SOLN
30.0000 mL | Status: AC
  Filled 2024-02-20 (×2): qty 30

## 2024-02-20 MED ORDER — SENNOSIDES-DOCUSATE SODIUM 8.6-50 MG PO TABS
2.0000 | ORAL_TABLET | Freq: Two times a day (BID) | ORAL | Status: DC
  Administered 2024-02-20 – 2024-02-24 (×7): 2 via ORAL
  Filled 2024-02-20 (×9): qty 2

## 2024-02-20 MED ORDER — POLYETHYLENE GLYCOL 3350 17 G PO PACK
17.0000 g | PACK | Freq: Two times a day (BID) | ORAL | Status: DC
  Administered 2024-02-20 – 2024-02-24 (×5): 17 g via ORAL
  Filled 2024-02-20 (×9): qty 1

## 2024-02-20 MED ORDER — HYDRALAZINE HCL 50 MG PO TABS
75.0000 mg | ORAL_TABLET | Freq: Three times a day (TID) | ORAL | Status: DC
  Administered 2024-02-20 – 2024-02-21 (×4): 75 mg via ORAL
  Filled 2024-02-20 (×4): qty 1

## 2024-02-20 MED ORDER — FENOFIBRATE 160 MG PO TABS
160.0000 mg | ORAL_TABLET | Freq: Every day | ORAL | Status: DC
  Administered 2024-02-20 – 2024-02-24 (×5): 160 mg via ORAL
  Filled 2024-02-20 (×5): qty 1

## 2024-02-20 NOTE — Plan of Care (Signed)

## 2024-02-20 NOTE — Care Management Important Message (Signed)
 Important Message  Patient Details  Name: Mykale Gandolfo MRN: [id number] Date of Birth: [birth year]   Important Message Given:  Yes - Medicare IM     Claretta Deed 02/20/2024, 1:54 PM

## 2024-02-20 NOTE — TOC Progression Note (Addendum)
 Transition of Care [facility]) - Progression Note    Patient Details  Name: Nathan Stafford MRN: [id number] Date of Birth: 1950-10-14  Transition of Care [facility]) CM/SW Contact  Almarie CHRISTELLA Goodie, [place] Phone Number: 02/20/2024, 10:15 AM  Clinical Narrative:   CSW spoke with son, Nathan Stafford, to discuss SNF options. Nathan Stafford had not seen the email from previous CSW with SNF options; Nathan Stafford searched email while on the phone with CSW and will review options and update CSW with choice by the end of the day.   CSW also acknowledging consult for Advance Directives CSW does not complete Advance Directives, would need to be a consult to spiritual care. However, patient does not appear oriented to complete advanced directives at this time; should patient orientation improve to give consent, please place order to spiritual care.  UPDATE 12:31 PM: CSW received call back from son, Nathan Stafford, who has chosen [facility]. CSW confirmed that [facility] will have a bed available for patient. CSW contacted CMA to request insurance authorization. CSW to follow.    Expected Discharge Plan: Skilled Nursing Facility Barriers to Discharge: Continued Medical Work up, English As A Second Language Teacher               Expected Discharge Plan and Services                                               Social Drivers of Health (SDOH) Interventions SDOH Screenings   Food Insecurity: Food Insecurity Present (02/16/2024)  Housing: Low Risk (02/16/2024)  Transportation Needs: No Transportation Needs (02/16/2024)  Utilities: Not At Risk (02/16/2024)  Alcohol Screen: Low Risk (07/30/2023)  Depression (PHQ2-9): Low Risk (07/30/2023)  Financial Resource Strain: Medium Risk (07/30/2023)  Physical Activity: Sufficiently Active (07/30/2023)  Social Connections: Socially Isolated (02/16/2024)  Stress: No Stress Concern Present (07/30/2023)  Tobacco Use: High Risk (02/14/2024)  Health Literacy: Adequate Health Literacy  (07/30/2023)    Readmission Risk Interventions     No data to display

## 2024-02-20 NOTE — Progress Notes (Addendum)
 " PROGRESS NOTE    Nathan Stafford  FMW:[id number] DOB: 06-17-1950 DOA: 02/14/2024 PCP: Joshua Debby CROME, MD    Chief Complaint  Patient presents with   Code Stroke    Brief Narrative:  Nathan Stafford is a 73 y.o. male with hx of hypertension, hyperlipidemia, glaucoma, tobacco abuse, presenting to the emergency department for evaluation of sudden onset of left-sided weakness.  Patient noted to have left facial droop left-sided weakness and slurred speech NIH stroke scale of 8.  Imaging at that time indicated intraparenchymal hemorrhage with subarachnoid hemorrhage, right.  Neurology called for admission.   Patient transitioned to TRH service on 12/16 given improvement in symptoms, concern over possible amyloid process as etiology for noted bleed.  Echo completed per protocol without worrisome findings, remainder of patient's labs appear to be within normal limits.  Patient continues to have ongoing deficits, at this time he is being evaluated for ongoing physical therapy with plan for ultimate discharge home once he has recovered from his injury.   Assessment & Plan:   Principal Problem:   Hemorrhagic stroke (HCC) Active Problems:   HTN (hypertension)   Prediabetes   Dyslipidemia, goal LDL below 70   Dysphagia   Glaucoma   Constipation   1 intraparenchymal hemorrhage and subarachnoid hemorrhage, right, POA -Patient initially admitted to the neurology team as he had presented with left-sided weakness. -CT head done on admission with acute hemorrhage in the superior right perirolandic area, suspected both intra axial hematoma estimated 5 mm and adjacent subarachnoid blood. - CT angiogram head and neck down with acute right perirolandic intracerebral hemorrhage without CTA spot sign. -MRI brain done right frontoparietal parenchymal hematoma with mild surrounding edema and small volume adjacent subarachnoid hemorrhage. -2D echo done with EF of 55 to 60%,NWMA, normal right ventricular systolic  function, normal right ventricular size, normal mitral valve and structure, no mitral stenosis or MVR.  Normal aortic valve structure.  No aortic stenosis.. - LDL of 97 - Hemoglobin A1c 4.9. - Neurology initially suspecting CVA as etiology based on imaging and risk factors. - Patient noted to be on aspirin  prior to admission. - Neurology recommending against going on antithrombotics. -Continue Lipitor 40 mg daily - PT/OT. - Likely needs SNF placement. - Neurology following.  2.  Hypertension  -BP improving and downtrending after initiation of hydralazine . - Continue Norvasc  10 mg daily, Toprol -XL 100 mg daily. - Increase hydralazine  to 75 mg every 8 hours. - If blood pressure remains uncontrolled despite maximization of hydralazine  we will consider addition of low-dose ARB versus spironolactone  per guidelines..   3.  Hyperlipidemia - Lipitor 40 mg daily.   4.  Dysphagia -Secondary to problem #1 -Improving - Advance diet as tolerated.  5.  Well-controlled type 2 diabetes -Hemoglobin A1c 4.9 (02/14/2024). - Outpatient follow-up.  6. glaucoma -Continue brimonidine  dorzolamide /timolol , lantanoprost  7.  Presumed thiamine deficiency -On thiamine per neurology.  8. constipation -Placed on MiraLAX twice daily, Senokot-S 2 tabs twice daily. -Sorbitol.    DVT prophylaxis: Lovenox  Code Status: Full Family Communication: Updated patient, no family at bedside.  Disposition: SNF  Status is: Inpatient Remains inpatient appropriate because: Severity of illness   Consultants:  Admission to neurology/stroke team  Procedures:  MRI brain 02/14/2024 2D echo 02/16/2024 CT head 02/14/2024 CT angiogram head and neck 02/14/2024  Antimicrobials:  Anti-infectives (From admission, onward)    None         Subjective: Patient sitting up in recliner.  Denies any chest pain or shortness of  breath.  No abdominal pain.  States slight improvement with left-sided weakness.  States  was just seen by neurologist who worked him out this morning.   Objective: Vitals:   02/20/24 0637 02/20/24 0759 02/20/24 1127 02/20/24 1417  BP: 138/71 (!) 159/83 (!) 163/78 (!) 150/90  Pulse: 79 85 67 65  Resp: (!) 22 18 18 18   Temp:  97.6 F (36.4 C) 99 F (37.2 C) 98.2 F (36.8 C)  TempSrc:  Oral Oral Oral  SpO2:  100% 100% 100%  Weight:      Height:        Intake/Output Summary (Last 24 hours) at 02/20/2024 1743 Last data filed at 02/20/2024 0900 Gross per 24 hour  Intake 720 ml  Output 450 ml  Net 270 ml   Filed Weights   02/14/24 0700 02/14/24 0828  Weight: 85.2 kg 85 kg    Examination:  General exam: NAD. Respiratory system: CTAB.  No wheezes, no crackles, no rhonchi.  Fair air movement.  Speaking full sentences.   Cardiovascular system: Regular rate rhythm no murmurs rubs or gallops.  No JVD.  No lower extremity edema.   Gastrointestinal system: Abdomen soft, nontender, nondistended, positive bowel sounds.  No rebound.  No guarding.  Central nervous system: Alert and oriented.  4/5 left lower extremity strength.  5/5 right lower extremity strength and right upper extremity.  3/5 left upper extremity strength.  Improved grip strength.  Extremities: 4/5 left lower extremity strength.  5/5 right upper and right lower extremity strength.  3/5 left upper extremity strength. Skin: No rashes, lesions or ulcers Psychiatry: Judgement and insight appear normal. Mood & affect appropriate.     Data Reviewed: I have personally reviewed following labs and imaging studies  CBC: Recent Labs  Lab 02/14/24 0751 02/14/24 0759 02/16/24 0536 02/20/24 0614  WBC 6.4  --  7.2 7.0  NEUTROABS 3.9  --   --   --   HGB 15.4 17.0 16.2 16.2  HCT 46.5 50.0 46.9 48.3  MCV 90.5  --  86.5 87.8  PLT 157  --  150 PLATELET CLUMPS NOTED ON SMEAR, COUNT APPEARS ADEQUATE    Basic Metabolic Panel: Recent Labs  Lab 02/14/24 0751 02/14/24 0759 02/16/24 0536 02/17/24 0224 02/20/24 0614   NA 140 144 137 138 136  K 4.1 4.0 3.3* 4.1 3.8  CL 108 107 105 108 106  CO2 27  --  20* 24 19*  GLUCOSE 109* 107* 132* 101* 107*  BUN 10 12 8 13 12   CREATININE 0.91 1.00 0.74 0.84 0.79  CALCIUM  9.2  --  9.0 8.8* 9.5  MG  --   --   --   --  2.0    GFR: Estimated Creatinine Clearance: 94.2 mL/min (by C-G formula based on SCr of 0.79 mg/dL).  Liver Function Tests: Recent Labs  Lab 02/14/24 0751  AST 55*  ALT 45*  ALKPHOS 61  BILITOT 0.7  PROT 6.7  ALBUMIN 3.5    CBG: Recent Labs  Lab 02/14/24 0753  GLUCAP 107*     Recent Results (from the past 240 hours)  MRSA Next Gen by PCR, Nasal     Status: None   Collection Time: 02/14/24  5:31 PM   Specimen: Nasal Mucosa; Nasal Swab  Result Value Ref Range Status   MRSA by PCR Next Gen NOT DETECTED NOT DETECTED Final    Comment: (NOTE) The GeneXpert MRSA Assay (FDA approved for NASAL specimens only), is one component of a  comprehensive MRSA colonization surveillance program. It is not intended to diagnose MRSA infection nor to guide or monitor treatment for MRSA infections. Test performance is not FDA approved in patients less than 28 years old. Performed at [facility], [address] [place], [place] [address]          Radiology Studies: No results found.      Scheduled Meds:  amLODipine   10 mg Oral Daily   atorvastatin   40 mg Oral Daily   brimonidine   1 drop Both Eyes BID   dorzolamide -timolol   1 drop Both Eyes BID   enoxaparin  (LOVENOX ) injection  40 mg Subcutaneous Q24H   fenofibrate  160 mg Oral Daily   hydrALAZINE   75 mg Oral Q8H   latanoprost   1 drop Both Eyes QHS   metoprolol  succinate  100 mg Oral Daily   nicotine   21 mg Transdermal Daily   pantoprazole   40 mg Oral QHS   polyethylene glycol  17 g Oral BID   senna-docusate  2 tablet Oral BID   sodium chloride  flush  3 mL Intravenous Once   sorbitol  30 mL Oral Q3H   Continuous Infusions:   LOS: 6 days    Time spent: 40  minutes    Toribio Hummer, MD [facility]   To contact the attending provider between 7A-7P or the covering provider during after hours 7P-7A, please log into the web site [email or web address] and access using universal [place] password for that web site. If you do not have the password, please call the hospital operator.  02/20/2024, 5:43 PM    "

## 2024-02-20 NOTE — Progress Notes (Signed)
 Occupational Therapy Treatment Patient Details Name: Nathan Stafford MRN: [id number] DOB: 25-Jul-1950 Today's Date: 02/20/2024   History of present illness 73 yo M adm 12/13 sudden L side weakness. 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 PMH: HTN, HLD, glaucoma, tobacco abuse, cardiomyopathy, GERD, ETOH abuse   OT comments  Patient demonstrating good gains with OT treatment with CGA to get to EOB from min assist and increased mobility with LLE. Patient able to stand from raised bed with min assist and transfer to recliner with min assist with HHA. LUE AAROM and AROM exercises performed to LUE with min verbal cues and patient demonstrating good understanding of exercises. Patient performed one stand from recliner to RW with improved grip on RW but continues to demonstrate left lateral leaning.  Discharge recommendations changed from AIR to continued inpatient follow up therapy, <3 hours/day due to family request.  Acute OT to continue to follow to address established goals to facilitate DC to next venue of care.        If plan is discharge home, recommend the following:  Assistance with cooking/housework;Assist for transportation;Help with stairs or ramp for entrance;Direct supervision/assist for financial management;Direct supervision/assist for medications management;A lot of help with walking and/or transfers;A lot of help with bathing/dressing/bathroom;Supervision due to cognitive status   Equipment Recommendations  Other (comment) (defer)    Recommendations for Other Services      Precautions / Restrictions Precautions Precautions: Fall Recall of Precautions/Restrictions: Impaired Precaution/Restrictions Comments: Lt sided inattention and weakness, pusher Restrictions Weight Bearing Restrictions Per Provider Order: No        Mobility Bed Mobility Overal bed mobility: Needs Assistance Bed Mobility: Supine to Sit     Supine to sit: Contact guard, HOB elevated, Used rails     General bed mobility comments: increased time and CGA with trunk, able to move LLE off EOB    Transfers Overall transfer level: Needs assistance Equipment used: Rolling walker (2 wheels), 1 person hand held assist Transfers: Sit to/from Stand, Bed to chair/wheelchair/BSC Sit to Stand: Min assist, Mod assist Stand pivot transfers: Min assist         General transfer comment: HHA to transfer to recliner with min/mod assist to stand from raised bed and min assist for transfer with left lateral leaning. stood from recliner to [employer] with mod assist     Balance Overall balance assessment: Needs assistance Sitting-balance support: Feet supported Sitting balance-Leahy Scale: Poor Sitting balance - Comments: Lt lean Postural control: Left lateral lean Standing balance support: Single extremity supported, Bilateral upper extremity supported, During functional activity Standing balance-Leahy Scale: Poor Standing balance comment: attempted to stand with RW with improved grip with LUE but left lateral leaning requiring min assist for balance                           ADL either performed or assessed with clinical judgement   ADL Overall ADL's : Needs assistance/impaired     Grooming: Minimal assistance;Sitting               Lower Body Dressing: Maximal assistance;Sitting/lateral leans                      Extremity/Trunk Assessment Upper Extremity Assessment LUE Deficits / Details: more return distal to proximal LUE Sensation: decreased light touch;decreased proprioception LUE  Coordination: decreased fine motor;decreased gross Systems Analyst Communication Communication: No apparent difficulties   Cognition Arousal: Alert Behavior During  Therapy: WFL for tasks assessed/performed Cognition: Cognition impaired     Awareness: Intellectual awareness intact, Online awareness impaired   Attention impairment (select first level of impairment): Sustained attention Executive functioning impairment (select all impairments): Sequencing, Reasoning, Initiation, Organization, Problem solving                   Following commands: Impaired Following commands impaired: Follows one step commands with increased time, Follows multi-step commands inconsistently      Cueing   Cueing Techniques: Verbal cues, Gestural cues, Tactile cues  Exercises Exercises: General Upper Extremity General Exercises - Upper Extremity Shoulder Flexion: AAROM, Left, 10 reps, Seated Shoulder ABduction: AAROM, Left, 10 reps, Seated Elbow Flexion: Left, 10 reps, Seated, AROM Elbow Extension: AROM, Left, 10 reps, Seated Wrist Flexion: AROM, Left, 10 reps, Seated Wrist Extension: AAROM, Left, 10 reps, Seated Digit Composite Flexion: AROM, 10 reps, Seated Composite Extension: AROM, Left, 10 reps, Seated    Shoulder Instructions       General Comments      Pertinent Vitals/ Pain       Pain Assessment Pain Assessment: No/denies pain  Home Living                                          Prior Functioning/Environment              Frequency  Min 2X/week        Progress Toward Goals  OT Goals(current goals can now be found in the care plan section)  Progress towards OT goals: Progressing toward goals  Acute Rehab OT Goals Patient Stated Goal: get stronger OT Goal Formulation: With patient Time For Goal Achievement: 03/01/24 Potential to Achieve Goals: Good ADL Goals Pt Will Perform Grooming: with set-up;sitting Pt Will Perform Upper Body Bathing: with supervision;sitting;with set-up Pt Will Perform Lower Body Dressing: with min assist;sit to/from stand;sitting/lateral leans Pt Will Transfer to Toilet: with min  assist;ambulating;bedside commode Pt/caregiver will Perform Home Exercise Program: Increased strength;Left upper extremity  Plan      Co-evaluation                 AM-PAC OT 6 Clicks Daily Activity     Outcome Measure   Help from another person eating meals?: A Little Help from another person taking care of personal grooming?: A Little Help from another person toileting, which includes using toliet, bedpan, or urinal?: Total Help from another person bathing (including washing, rinsing, drying)?: Total Help from another person to put on and taking off regular upper body clothing?: A Little Help from another person to put on and taking off regular lower body clothing?: A Lot 6 Click Score: 13    End of Session Equipment Utilized During Treatment: Gait belt;Rolling walker (2 wheels)  OT Visit Diagnosis: Other abnormalities of gait and mobility (R26.89);Hemiplegia and hemiparesis;Other symptoms and signs involving the nervous system (R29.898) Hemiplegia - Right/Left: Left Hemiplegia - dominant/non-dominant: Non-Dominant   Activity Tolerance Patient tolerated treatment well   Patient Left in chair;with call bell/phone within reach;with chair alarm set   Nurse Communication Mobility status;Precautions  Time: 9079-9052 OT Time Calculation (min): 27 min  Charges: OT General Charges $OT Visit: 1 Visit OT Treatments $Self Care/Home Management : 8-22 mins $Therapeutic Exercise: 8-22 mins  Dick Laine, OTA Acute Rehabilitation Services  Office [phone]   Jeb LITTIE Laine 02/20/2024, 11:17 AM

## 2024-02-21 DIAGNOSIS — E785 Hyperlipidemia, unspecified: Secondary | ICD-10-CM | POA: Diagnosis not present

## 2024-02-21 DIAGNOSIS — K5900 Constipation, unspecified: Secondary | ICD-10-CM

## 2024-02-21 DIAGNOSIS — I10 Essential (primary) hypertension: Secondary | ICD-10-CM | POA: Diagnosis not present

## 2024-02-21 DIAGNOSIS — I619 Nontraumatic intracerebral hemorrhage, unspecified: Secondary | ICD-10-CM | POA: Diagnosis not present

## 2024-02-21 DIAGNOSIS — R1310 Dysphagia, unspecified: Secondary | ICD-10-CM | POA: Diagnosis not present

## 2024-02-21 DIAGNOSIS — H409 Unspecified glaucoma: Secondary | ICD-10-CM | POA: Diagnosis not present

## 2024-02-21 NOTE — Progress Notes (Signed)
 " PROGRESS NOTE    Nathan Stafford  FMW:[id number] DOB: 01-04-1951 DOA: 02/14/2024 PCP: Joshua Debby CROME, MD    Chief Complaint  Patient presents with   Code Stroke    Brief Narrative:  Nathan Stafford is a 73 y.o. male with hx of hypertension, hyperlipidemia, glaucoma, tobacco abuse, presenting to the emergency department for evaluation of sudden onset of left-sided weakness.  Patient noted to have left facial droop left-sided weakness and slurred speech NIH stroke scale of 8.  Imaging at that time indicated intraparenchymal hemorrhage with subarachnoid hemorrhage, right.  Neurology called for admission.   Patient transitioned to TRH service on 12/16 given improvement in symptoms, concern over possible amyloid process as etiology for noted bleed.  Echo completed per protocol without worrisome findings, remainder of patient's labs appear to be within normal limits.  Patient continues to have ongoing deficits, at this time he is being evaluated for ongoing physical therapy with plan for ultimate discharge home once he has recovered from his injury.   Assessment & Plan:   Principal Problem:   Hemorrhagic stroke (HCC) Active Problems:   HTN (hypertension)   Prediabetes   Dyslipidemia, goal LDL below 70   Dysphagia   Glaucoma   Constipation   1 intraparenchymal hemorrhage and subarachnoid hemorrhage, right, POA -Patient initially admitted to the neurology team as he had presented with left-sided weakness. -CT head done on admission with acute hemorrhage in the superior right perirolandic area, suspected both intra axial hematoma estimated 5 mm and adjacent subarachnoid blood. - CT angiogram head and neck down with acute right perirolandic intracerebral hemorrhage without CTA spot sign. -MRI brain done right frontoparietal parenchymal hematoma with mild surrounding edema and small volume adjacent subarachnoid hemorrhage. -2D echo done with EF of 55 to 60%,NWMA, normal right ventricular systolic  function, normal right ventricular size, normal mitral valve and structure, no mitral stenosis or MVR.  Normal aortic valve structure.  No aortic stenosis.. - LDL of 97 - Hemoglobin A1c 4.9. - Neurology initially suspecting CVA as etiology based on imaging and risk factors. - Patient noted to be on aspirin  prior to admission. - Neurology recommending against going on antithrombotics. -Continue Lipitor 40 mg daily - PT/OT. - Likely needs SNF placement. - Neurology following.  2.  Hypertension  -BP improving and downtrending after initiation of hydralazine . - Continue Norvasc  10 mg daily, Toprol -XL 100 mg daily. - Continue hydralazine  75 mg every 8 hours. - If blood pressure remains uncontrolled despite maximization of hydralazine  we will consider addition of low-dose ARB versus spironolactone  per guidelines..   3.  Hyperlipidemia - Continue Lipitor 40 mg daily.   4.  Dysphagia -Secondary to problem #1 -Improving - Advance diet as tolerated.  5.  Well-controlled type 2 diabetes -Hemoglobin A1c 4.9 (02/14/2024). - Outpatient follow-up.  6. glaucoma -Continue brimonidine  dorzolamide /timolol , lantanoprost  7.  Presumed thiamine deficiency -On thiamine per neurology.  8. constipation - Continue MiraLAX  twice daily, Senokot S2 tabs twice daily.  - Patient noted to have received sorbitol  with good results.  - Continue bowel regimen of MiraLAX  twice daily, Senokot-S twice daily.      DVT prophylaxis: Lovenox  Code Status: Full Family Communication: Updated patient, no family at bedside.  Disposition: SNF when bed available.   Status is: Inpatient Remains inpatient appropriate because: Severity of illness   Consultants:  Admission to neurology/stroke team  Procedures:  MRI brain 02/14/2024 2D echo 02/16/2024 CT head 02/14/2024 CT angiogram head and neck 02/14/2024  Antimicrobials:  Anti-infectives (From  admission, onward)    None         Subjective: Patient  laying in bed eating lunch.  Denies any chest pain or shortness of breath.  Still with left-sided weakness.  Left-sided weakness slowly improving.  Objective: Vitals:   02/20/24 2023 02/20/24 2353 02/21/24 0359 02/21/24 1213  BP: (!) 140/61 (!) 119/55 (!) 158/92 138/64  Pulse: 67 62 77 (!) 58  Resp:   16 16  Temp: 97.8 F (36.6 C) 97.6 F (36.4 C) 98 F (36.7 C) 97.7 F (36.5 C)  TempSrc: Oral Oral Oral Oral  SpO2: 98% 98% 100% 100%  Weight:      Height:        Intake/Output Summary (Last 24 hours) at 02/21/2024 1326 Last data filed at 02/21/2024 0600 Gross per 24 hour  Intake --  Output 500 ml  Net -500 ml   Filed Weights   02/14/24 0700 02/14/24 0828  Weight: 85.2 kg 85 kg    Examination:  General exam: NAD. Respiratory system: Lungs clear to auscultation bilaterally.  No wheezes, no crackles, no rhonchi.  Fair air movement.  Speaking in full sentences.  Cardiovascular system: RRR no murmurs rubs or gallops.  No JVD.  No lower extremity edema.   Gastrointestinal system: Abdomen is soft, nontender, nondistended, positive bowel sounds.  No rebound.  No guarding.  Central nervous system: Alert and oriented.  4/5 left lower extremity strength.  5/5 right lower extremity strength and right upper extremity.  3/5 left upper extremity strength.  Improved grip strength.  Extremities: 4/5 left lower extremity strength.  5/5 right upper and right lower extremity strength.  3/5 left upper extremity strength. Skin: No rashes, lesions or ulcers Psychiatry: Judgement and insight appear normal. Mood & affect appropriate.     Data Reviewed: I have personally reviewed following labs and imaging studies  CBC: Recent Labs  Lab 02/16/24 0536 02/20/24 0614  WBC 7.2 7.0  HGB 16.2 16.2  HCT 46.9 48.3  MCV 86.5 87.8  PLT 150 PLATELET CLUMPS NOTED ON SMEAR, COUNT APPEARS ADEQUATE    Basic Metabolic Panel: Recent Labs  Lab 02/16/24 0536 02/17/24 0224 02/20/24 0614  NA 137 138  136  K 3.3* 4.1 3.8  CL 105 108 106  CO2 20* 24 19*  GLUCOSE 132* 101* 107*  BUN 8 13 12   CREATININE 0.74 0.84 0.79  CALCIUM  9.0 8.8* 9.5  MG  --   --  2.0    GFR: Estimated Creatinine Clearance: 94.2 mL/min (by C-G formula based on SCr of 0.79 mg/dL).  Liver Function Tests: No results for input(s): AST, ALT, ALKPHOS, BILITOT, PROT, ALBUMIN in the last 168 hours.   CBG: No results for input(s): GLUCAP in the last 168 hours.    Recent Results (from the past 240 hours)  MRSA Next Gen by PCR, Nasal     Status: None   Collection Time: 02/14/24  5:31 PM   Specimen: Nasal Mucosa; Nasal Swab  Result Value Ref Range Status   MRSA by PCR Next Gen NOT DETECTED NOT DETECTED Final    Comment: (NOTE) The GeneXpert MRSA Assay (FDA approved for NASAL specimens only), is one component of a comprehensive MRSA colonization surveillance program. It is not intended to diagnose MRSA infection nor to guide or monitor treatment for MRSA infections. Test performance is not FDA approved in patients less than 34 years old. Performed at [facility], [address], [place], [place] [address]  Radiology Studies: No results found.      Scheduled Meds:  amLODipine   10 mg Oral Daily   atorvastatin   40 mg Oral Daily   brimonidine   1 drop Both Eyes BID   dorzolamide -timolol   1 drop Both Eyes BID   enoxaparin  (LOVENOX ) injection  40 mg Subcutaneous Q24H   fenofibrate   160 mg Oral Daily   hydrALAZINE   75 mg Oral Q8H   latanoprost   1 drop Both Eyes QHS   metoprolol  succinate  100 mg Oral Daily   nicotine   21 mg Transdermal Daily   pantoprazole   40 mg Oral QHS   polyethylene glycol  17 g Oral BID   senna-docusate  2 tablet Oral BID   sodium chloride  flush  3 mL Intravenous Once   Continuous Infusions:   LOS: 7 days    Time spent: 40 minutes    Toribio Hummer, MD [facility]   To contact the attending provider between 7A-7P or the  covering provider during after hours 7P-7A, please log into the web site [email or web address] and access using universal [place] password for that web site. If you do not have the password, please call the hospital operator.  02/21/2024, 1:26 PM    "

## 2024-02-21 NOTE — Plan of Care (Signed)

## 2024-02-21 NOTE — Plan of Care (Signed)
" °  Problem: Education: Goal: Knowledge of disease or condition will improve Outcome: Progressing Goal: Knowledge of secondary prevention will improve (MUST DOCUMENT ALL) Outcome: Progressing Goal: Knowledge of patient specific risk factors will improve (DELETE if not current risk factor) Outcome: Progressing   Problem: Intracerebral Hemorrhage Tissue Perfusion: Goal: Complications of Intracerebral Hemorrhage will be minimized Outcome: Progressing   Problem: Coping: Goal: Will verbalize positive feelings about self Outcome: Progressing Goal: Will identify appropriate support needs Outcome: Progressing   Problem: Health Behavior/Discharge Planning: Goal: Ability to manage health-related needs will improve Outcome: Progressing Goal: Goals will be collaboratively established with patient/family Outcome: Progressing   Problem: Self-Care: Goal: Ability to participate in self-care as condition permits will improve Outcome: Progressing Goal: Verbalization of feelings and concerns over difficulty with self-care will improve Outcome: Progressing Goal: Ability to communicate needs accurately will improve Outcome: Progressing   Problem: Nutrition: Goal: Risk of aspiration will decrease Outcome: Progressing Goal: Dietary intake will improve Outcome: Progressing   Problem: Education: Goal: Knowledge of General Education information will improve Description: Including pain rating scale, medication(s)/side effects and non-pharmacologic comfort measures Outcome: Progressing   Problem: Health Behavior/Discharge Planning: Goal: Ability to manage health-related needs will improve Outcome: Progressing   Problem: Clinical Measurements: Goal: Ability to maintain clinical measurements within normal limits will improve Outcome: Progressing Goal: Will remain free from infection Outcome: Progressing Goal: Diagnostic test results will improve Outcome: Progressing Goal: Respiratory  complications will improve Outcome: Progressing Goal: Cardiovascular complication will be avoided Outcome: Progressing   Problem: Activity: Goal: Risk for activity intolerance will decrease Outcome: Progressing   Problem: Nutrition: Goal: Adequate nutrition will be maintained Outcome: Progressing   Problem: Elimination: Goal: Will not experience complications related to urinary retention Outcome: Progressing   Problem: Safety: Goal: Ability to remain free from injury will improve Outcome: Progressing   "

## 2024-02-22 ENCOUNTER — Other Ambulatory Visit (HOSPITAL_COMMUNITY): Payer: Self-pay

## 2024-02-22 DIAGNOSIS — E785 Hyperlipidemia, unspecified: Secondary | ICD-10-CM | POA: Diagnosis not present

## 2024-02-22 DIAGNOSIS — H409 Unspecified glaucoma: Secondary | ICD-10-CM | POA: Diagnosis not present

## 2024-02-22 DIAGNOSIS — I619 Nontraumatic intracerebral hemorrhage, unspecified: Secondary | ICD-10-CM | POA: Diagnosis not present

## 2024-02-22 DIAGNOSIS — R1310 Dysphagia, unspecified: Secondary | ICD-10-CM | POA: Diagnosis not present

## 2024-02-22 MED ORDER — HYDRALAZINE HCL 50 MG PO TABS
100.0000 mg | ORAL_TABLET | Freq: Three times a day (TID) | ORAL | Status: DC
  Administered 2024-02-22 – 2024-02-23 (×3): 100 mg via ORAL
  Filled 2024-02-22 (×4): qty 2

## 2024-02-22 NOTE — Plan of Care (Signed)

## 2024-02-22 NOTE — Progress Notes (Signed)
 " PROGRESS NOTE    Nathan Stafford  FMW:[id number] DOB: [birth year] DOA: 02/14/2024 PCP: Joshua Debby CROME, MD    Chief Complaint  Patient presents with   Code Stroke    Brief Narrative:  Nathan Stafford is a 73 y.o. male with hx of hypertension, hyperlipidemia, glaucoma, tobacco abuse, presenting to the emergency department for evaluation of sudden onset of left-sided weakness.  Patient noted to have left facial droop left-sided weakness and slurred speech NIH stroke scale of 8.  Imaging at that time indicated intraparenchymal hemorrhage with subarachnoid hemorrhage, right.  Neurology called for admission.   Patient transitioned to TRH service on 12/16 given improvement in symptoms, concern over possible amyloid process as etiology for noted bleed.  Echo completed per protocol without worrisome findings, remainder of patient's labs appear to be within normal limits.  Patient continues to have ongoing deficits, at this time he is being evaluated for ongoing physical therapy with plan for ultimate discharge home once he has recovered from his injury.   Assessment & Plan:   Principal Problem:   Hemorrhagic stroke (HCC) Active Problems:   HTN (hypertension)   Prediabetes   Dyslipidemia, goal LDL below 70   Dysphagia   Glaucoma   Constipation   1 intraparenchymal hemorrhage and subarachnoid hemorrhage, right, POA -Patient initially admitted to the neurology team as he had presented with left-sided weakness. -CT head done on admission with acute hemorrhage in the superior right perirolandic area, suspected both intra axial hematoma estimated 5 mm and adjacent subarachnoid blood. - CT angiogram head and neck down with acute right perirolandic intracerebral hemorrhage without CTA spot sign. -MRI brain done right frontoparietal parenchymal hematoma with mild surrounding edema and small volume adjacent subarachnoid hemorrhage. -2D echo done with EF of 55 to 60%,NWMA, normal right ventricular systolic  function, normal right ventricular size, normal mitral valve and structure, no mitral stenosis or MVR.  Normal aortic valve structure.  No aortic stenosis.. - LDL of 97 - Hemoglobin A1c 4.9. - Neurology initially suspecting CVA as etiology based on imaging and risk factors. - Patient noted to be on aspirin  prior to admission. - Neurology recommending against going on antithrombotics. -Continue Lipitor 40 mg daily - PT/OT. - Awaiting SNF placement.  -Outpatient follow-up with neurology.  2.  Hypertension  -BP improving and downtrending after initiation of hydralazine . - Continue Norvasc  10 mg daily, Toprol -XL 100 mg daily. - Increase hydralazine  to 100 mg every 8 hours.  - If blood pressure remains uncontrolled despite maximization of hydralazine  we will consider addition of low-dose ARB versus spironolactone  per guidelines..   3.  Hyperlipidemia - Lipitor 40 mg daily.    4.  Dysphagia -Secondary to problem #1 - Improving.   - Currently on heart healthy/carb modified diet  - Change diet to 2 g sodium diet.   5.  Well-controlled type 2 diabetes -Hemoglobin A1c 4.9 (02/14/2024). - Outpatient follow-up.  6. glaucoma -Continue brimonidine  dorzolamide /timolol , lantanoprost  7.  Presumed thiamine deficiency -On thiamine per neurology.  8. constipation - Continue MiraLAX  twice daily, Senokot S2 tabs twice daily.  - Patient noted to have received sorbitol  with good results.     DVT prophylaxis: Lovenox  Code Status: Full Family Communication: Updated patient, no family at bedside.  Disposition: Patient medically stable.  SNF when bed available.   Status is: Inpatient Remains inpatient appropriate because: Severity of illness   Consultants:  Admission to neurology/stroke team  Procedures:  MRI brain 02/14/2024 2D echo 02/16/2024 CT head 02/14/2024 CT angiogram  head and neck 02/14/2024  Antimicrobials:  Anti-infectives (From admission, onward)    None          Subjective: Patient laying in bed.  Denies any chest pain or shortness of breath.  No abdominal pain.  Still with left upper extremity weakness.   Objective: Vitals:   02/21/24 1947 02/22/24 0049 02/22/24 0738 02/22/24 0823  BP: (!) 165/70 (!) 123/52 (!) 174/70 (!) 149/62  Pulse: 77 60 63   Resp:      Temp: 98.4 F (36.9 C) 98.3 F (36.8 C) (!) 97.5 F (36.4 C)   TempSrc: Oral Oral Oral   SpO2: 99% 99% 98%   Weight:      Height:        Intake/Output Summary (Last 24 hours) at 02/22/2024 1051 Last data filed at 02/21/2024 1200 Gross per 24 hour  Intake --  Output 400 ml  Net -400 ml   Filed Weights   02/14/24 0700 02/14/24 0828  Weight: 85.2 kg 85 kg    Examination:  General exam: NAD. Respiratory system: CTAB.  No wheezes, no crackles, no rhonchi.  Fair air movement.  Speaking in full sentences. Cardiovascular system: Regular rate rhythm no murmurs rubs or gallops.  No JVD.  No lower extremity edema.   Gastrointestinal system: Abdomen is soft, nontender, nondistended, positive bowel sounds.  No rebound.  No guarding.  Central nervous system: Alert and oriented.  4/5 left lower extremity strength.  5/5 right lower extremity strength and right upper extremity.  3/5 left upper extremity strength.  Improved grip strength.  Extremities: 4/5 left lower extremity strength.  5/5 right upper and right lower extremity strength.  3/5 left upper extremity strength. Skin: No rashes, lesions or ulcers Psychiatry: Judgement and insight appear normal. Mood & affect appropriate.     Data Reviewed: I have personally reviewed following labs and imaging studies  CBC: Recent Labs  Lab 02/16/24 0536 02/20/24 0614  WBC 7.2 7.0  HGB 16.2 16.2  HCT 46.9 48.3  MCV 86.5 87.8  PLT 150 PLATELET CLUMPS NOTED ON SMEAR, COUNT APPEARS ADEQUATE    Basic Metabolic Panel: Recent Labs  Lab 02/16/24 0536 02/17/24 0224 02/20/24 0614  NA 137 138 136  K 3.3* 4.1 3.8  CL 105 108 106   CO2 20* 24 19*  GLUCOSE 132* 101* 107*  BUN 8 13 12   CREATININE 0.74 0.84 0.79  CALCIUM  9.0 8.8* 9.5  MG  --   --  2.0    GFR: Estimated Creatinine Clearance: 94.2 mL/min (by C-G formula based on SCr of 0.79 mg/dL).  Liver Function Tests: No results for input(s): AST, ALT, ALKPHOS, BILITOT, PROT, ALBUMIN in the last 168 hours.   CBG: No results for input(s): GLUCAP in the last 168 hours.    Recent Results (from the past 240 hours)  MRSA Next Gen by PCR, Nasal     Status: None   Collection Time: 02/14/24  5:31 PM   Specimen: Nasal Mucosa; Nasal Swab  Result Value Ref Range Status   MRSA by PCR Next Gen NOT DETECTED NOT DETECTED Final    Comment: (NOTE) The GeneXpert MRSA Assay (FDA approved for NASAL specimens only), is one component of a comprehensive MRSA colonization surveillance program. It is not intended to diagnose MRSA infection nor to guide or monitor treatment for MRSA infections. Test performance is not FDA approved in patients less than 75 years old. Performed at [facility], [address], [place], [place] [address]  Radiology Studies: No results found.      Scheduled Meds:  amLODipine   10 mg Oral Daily   atorvastatin   40 mg Oral Daily   brimonidine   1 drop Both Eyes BID   dorzolamide -timolol   1 drop Both Eyes BID   enoxaparin  (LOVENOX ) injection  40 mg Subcutaneous Q24H   fenofibrate   160 mg Oral Daily   hydrALAZINE   100 mg Oral Q8H   latanoprost   1 drop Both Eyes QHS   metoprolol  succinate  100 mg Oral Daily   nicotine   21 mg Transdermal Daily   pantoprazole   40 mg Oral QHS   polyethylene glycol  17 g Oral BID   senna-docusate  2 tablet Oral BID   sodium chloride  flush  3 mL Intravenous Once   Continuous Infusions:   LOS: 8 days    Time spent: 40 minutes    Toribio Hummer, MD [facility]   To contact the attending provider between 7A-7P or the covering provider during after hours 7P-7A,  please log into the web site [email or web address] and access using universal [place] password for that web site. If you do not have the password, please call the hospital operator.  02/22/2024, 10:51 AM    "

## 2024-02-23 ENCOUNTER — Telehealth: Payer: Self-pay

## 2024-02-23 DIAGNOSIS — E785 Hyperlipidemia, unspecified: Secondary | ICD-10-CM | POA: Diagnosis not present

## 2024-02-23 DIAGNOSIS — K5900 Constipation, unspecified: Secondary | ICD-10-CM | POA: Diagnosis not present

## 2024-02-23 DIAGNOSIS — I10 Essential (primary) hypertension: Secondary | ICD-10-CM | POA: Diagnosis not present

## 2024-02-23 DIAGNOSIS — I619 Nontraumatic intracerebral hemorrhage, unspecified: Secondary | ICD-10-CM | POA: Diagnosis not present

## 2024-02-23 MED ORDER — HYDRALAZINE HCL 50 MG PO TABS
75.0000 mg | ORAL_TABLET | Freq: Three times a day (TID) | ORAL | Status: DC
  Administered 2024-02-23 – 2024-02-24 (×2): 75 mg via ORAL
  Filled 2024-02-23 (×2): qty 1

## 2024-02-23 NOTE — Plan of Care (Signed)

## 2024-02-23 NOTE — Progress Notes (Signed)
 Occupational Therapy Treatment Patient Details Name: Nathan Stafford MRN: [id number] DOB: February 21, 1951 Today's Date: 02/23/2024   History of present illness 73 yo M adm 12/13 sudden L side weakness. 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 PMH: HTN, HLD, glaucoma, tobacco abuse, cardiomyopathy, GERD, ETOH abuse   OT comments  Patient demonstrating good gains with OT with patient able to transfer to recliner today with RW and increased grip with LUE but continues to demonstrate left lateral leaning when standing and difficulty advancing LLE. Patient able to perform grooming seated at sink and use LUE to assist. Patient demonstrating gains with LUE use with improved shoulder flexion and abduction and grip strength.  Patient will benefit from continued inpatient follow up therapy, <3 hours/day.  Acute OT to continue to follow to address established goals to facilitate DC to next venue of care.        If plan is discharge home, recommend the following:  Assistance with cooking/housework;Assist for transportation;Help with stairs or ramp for entrance;Direct supervision/assist for financial management;Direct supervision/assist for medications management;A lot of help with walking and/or transfers;A lot of help with bathing/dressing/bathroom;Supervision due to cognitive status   Equipment Recommendations  Other (comment) (defer)    Recommendations for Other Services      Precautions / Restrictions Precautions Precautions: Fall Recall of Precautions/Restrictions: Impaired Precaution/Restrictions Comments: Lt sided inattention and weakness, pusher Restrictions Weight Bearing Restrictions Per Provider Order: No       Mobility Bed Mobility Overal bed mobility: Needs Assistance Bed Mobility: Supine to Sit     Supine to sit: Contact  guard, HOB elevated, Used rails     General bed mobility comments: increased time and CGA with trunk, able to move LLE off EOB    Transfers Overall transfer level: Needs assistance Equipment used: Rolling walker (2 wheels) Transfers: Sit to/from Stand, Bed to chair/wheelchair/BSC Sit to Stand: Mod assist, From elevated surface     Step pivot transfers: Mod assist, Max assist     General transfer comment: mod assist to power up from EOB and chair and mod to max assist for balance with step pivot transfers.  Difficulty advancing LLE and holding onto walker with LUE but improved grip     Balance Overall balance assessment: Needs assistance Sitting-balance support: Feet supported Sitting balance-Leahy Scale: Poor Sitting balance - Comments: progressing to fair Postural control: Left lateral lean Standing balance support: Bilateral upper extremity supported, During functional activity Standing balance-Leahy Scale: Poor Standing balance comment: left lateral leaning when standing and requires mod to max assist to steady                           ADL either performed or assessed with clinical judgement   ADL Overall ADL's : Needs assistance/impaired     Grooming: Wash/dry hands;Wash/dry face;Oral care;Set up;Sitting Grooming Details (indicate cue type and reason): at sink, patient able to use LUE to assist                                    Extremity/Trunk Assessment              Vision       Perception     Praxis     Communication Communication Communication: No apparent difficulties  Cognition Arousal: Alert Behavior During Therapy: WFL for tasks assessed/performed Cognition: Cognition impaired     Awareness: Intellectual awareness intact, Online awareness impaired   Attention impairment (select first level of impairment): Sustained attention Executive functioning impairment (select all impairments): Sequencing, Reasoning,  Initiation, Organization, Problem solving                   Following commands: Impaired Following commands impaired: Follows one step commands with increased time, Follows multi-step commands inconsistently      Cueing   Cueing Techniques: Verbal cues, Gestural cues, Tactile cues  Exercises Exercises: General Upper Extremity, Other exercises General Exercises - Upper Extremity Shoulder Flexion: AAROM, Left, 10 reps, Seated Shoulder ABduction: AAROM, Left, 10 reps, Seated Elbow Flexion: Left, 10 reps, Seated, AROM Elbow Extension: AROM, Left, 10 reps, Seated Wrist Extension: Left, 10 reps, Seated, AROM Digit Composite Flexion: AROM, 10 reps, Seated Composite Extension: AROM, Left, 10 reps, Seated Other Exercises Other Exercises: pink foam block provided to address LUE grip strength    Shoulder Instructions       General Comments      Pertinent Vitals/ Pain       Pain Assessment Pain Assessment: No/denies pain  Home Living                                          Prior Functioning/Environment              Frequency  Min 2X/week        Progress Toward Goals  OT Goals(current goals can now be found in the care plan section)  Progress towards OT goals: Progressing toward goals     Plan      Co-evaluation                 AM-PAC OT 6 Clicks Daily Activity     Outcome Measure   Help from another person eating meals?: A Little Help from another person taking care of personal grooming?: A Little Help from another person toileting, which includes using toliet, bedpan, or urinal?: Total Help from another person bathing (including washing, rinsing, drying)?: Total Help from another person to put on and taking off regular upper body clothing?: A Little Help from another person to put on and taking off regular lower body clothing?: A Lot 6 Click Score: 13    End of Session Equipment Utilized During Treatment: Gait belt;Rolling  walker (2 wheels)  OT Visit Diagnosis: Other abnormalities of gait and mobility (R26.89);Hemiplegia and hemiparesis;Other symptoms and signs involving the nervous system (R29.898) Hemiplegia - Right/Left: Left Hemiplegia - dominant/non-dominant: Non-Dominant   Activity Tolerance Patient tolerated treatment well   Patient Left in chair;with call bell/phone within reach;with chair alarm set   Nurse Communication Mobility status;Precautions        Time: 9166-9087 OT Time Calculation (min): 39 min  Charges: OT General Charges $OT Visit: 1 Visit OT Treatments $Self Care/Home Management : 8-22 mins $Therapeutic Activity: 8-22 mins $Therapeutic Exercise: 8-22 mins  Dick Laine, OTA Acute Rehabilitation Services  Office [phone]   Jeb LITTIE Laine 02/23/2024, 11:04 AM

## 2024-02-23 NOTE — Progress Notes (Signed)
 "  02/23/24 1100  Spiritual Encounters  Type of Visit Initial  Care provided to: Pt and family  Referral source Nurse (RN/NT/LPN)  Reason for visit Advance directives    Chaplain responded to consult request Advance Directives. The patient received the education. His son will be his health care agent.   Chaplain provided the Advance Directive packet as well as education on Advance Directives-documents an individual completes to communicate their health care directions in advance of a time when they may need them. Chaplain informed pt the documents which may be completed here in the hospital are the Living Will and Health Care Power of [place].  Chaplain informed that the Health Care Power of [place] is a legal document in which an individual names another person, their Health Care Agent, to make health care decisions when the individual is not able to make them for themselves. The Health Care Agent's function can be temporary or permanent depending on the pt's ability to make and communicate those decisions independently. Chaplain informed pt in the absence of a Health Care Power of Attorney, the state of [place]  directs health care providers to look to the following individuals in the order listed: legal guardian; an attorney?in?fact under a general power of attorney (POA) if that POA includes the right to make health care decisions; a husband or wife; a majority of parents and adult children; a majority of adult brothers and sisters; or an individual who has an established relationship with you, who is acting in good faith and who can convey your wishes.  If none of these person are available or willing to make medical decisions on a patient's behalf, the law allows the patient's doctor to make decisions for them as long as another doctor agrees with those decisions.  Chaplain also informed the patient that the Health Care agent has no decision-making authority over any affairs other than  those related to his or her medical care.  The chaplain further educated the pt that a Living Will is a legal document that allows an individual to state his or her desire not to receive life-prolonging measures in the event that they have a condition that is incurable and will result in their death in a short period of time; they are unconscious, and doctors are confident that they will not regain consciousness; and/or they have advanced dementia or other substantial and irreversible loss of mental function. The chaplain informed pt that life-prolonging measures are medical treatments that would only serve to postpone death, including breathing machines, kidney dialysis, antibiotics, artificial nutrition and hydration (tube feeding), and similar forms of treatment and that if an individual is able to express their wishes, they may also make them known without the use of a Living Will, but in the event that an individual is not able to express their wishes themselves, a Living Will allows medical providers and the pt's family and friends ensure that they are not making decisions on the pt's behalf, but rather serving as the pt's voice to convey decisions the pt has already made.  The patient is aware that the decision to create an advance directive is theirs alone and they may chose not to complete the documents or may chose to complete one portion or both.  The patient was informed that they can revoke the documents at any time by striking through them and writing void or by completing new documents, but that it is also advisable that the individual verbally notify interested parties that their  wishes have changed.  They are also aware that the document must be signed in the presence of a notary public and two witnesses and that this can be done while the patient is still admitted to the hospital or after discharge in the community. If they decide to complete Advance Directives after being discharged from the  hospital, they have been advised to notify all interested parties and to provide those documents to their physicians and loved ones in addition to bringing them to the hospital in the event of another hospitalization.  The chaplain informed the pt that if they desire to proceed with completing Advance Directive Documentation while they are still admitted, notary services are typically available at [facility] between the hours of 1:00 and 3:30 Monday-Thursday.    When the patient is ready to have these documents completed, the patient should request that their nurse place a spiritual care consult and indicate that the patient is ready to have their advance directives notarized so that arrangements for witnesses and notary public can be made.  Please page spiritual care if the patient desires further education or has questions.   M.Kubra Susanna Kerry Resident [phone]    "

## 2024-02-23 NOTE — Progress Notes (Signed)
 Physical Therapy Treatment Patient Details Name: Nathan Stafford MRN: [id number] DOB: 1951/01/05 Today's Date: 02/23/2024   History of Present Illness 73 yo M adm 12/13 sudden L side weakness. 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 PMH: HTN, HLD, glaucoma, tobacco abuse, cardiomyopathy, GERD, ETOH abuse    PT Comments  Pt seated in recliner on arrival.  Pt needed to transfer to commode for BM.  Presents with L innattention and L pushing.   Required assistance to shift weight.  Pt fatigues quickly with increased activity.  Pt eager to mobilize and improve this session.  Continue to recommend rehab in a post acute setting.     If plan is discharge home, recommend the following: A lot of help with walking and/or transfers;A lot of help with bathing/dressing/bathroom;Assist for transportation;Supervision due to cognitive status;Assistance with feeding;Assistance with cooking/housework;Direct supervision/assist for medications management;Direct supervision/assist for financial management   Can travel by private vehicle     No  Equipment Recommendations  Other (comment) (RW)    Recommendations for Other Services Rehab consult     Precautions / Restrictions Precautions Precautions: Fall Recall of Precautions/Restrictions: Impaired Precaution/Restrictions Comments: Lt sided inattention and weakness, pusher Restrictions Weight Bearing Restrictions Per Provider Order: No     Mobility  Bed Mobility   Bed Mobility: Sit to Supine           General bed mobility comments: Pt recieved in recliner on arrival.  Pt voiced need for transfer to commode for BM.    Transfers Overall transfer level: Needs assistance Equipment used: Rolling walker (2 wheels) Transfers: Sit to/from Stand, Bed to chair/wheelchair/BSC Sit to Stand: Mod  assist, From elevated surface Stand pivot transfers: Mod assist Step pivot transfers: Max assist       General transfer comment: Pt required assistance this session.  Pt with L Lateral push during stand pivot.  Required face to face assistance to move from recliner to commode.  Performed additional sit to stand transfers with emphasis on L hand position and pushing with LUE and transitioning weight to the R side.    Ambulation/Gait         Gait velocity: slow     General Gait Details: IN standing with RW and hand over hand on L side.  Performed weight shifting in standing.  Pt continues to push to the L side.   Stairs             Wheelchair Mobility     Tilt Bed    Modified Rankin (Stroke Patients Only) Modified Rankin (Stroke Patients Only) Pre-Morbid Rankin Score: No symptoms Modified Rankin: Moderately severe disability     Balance Overall balance assessment: Needs assistance Sitting-balance support: Feet supported Sitting balance-Leahy Scale: Poor Sitting balance - Comments: progressing to fair     Standing balance-Leahy Scale: Poor Standing balance comment: left lateral leaning when standing and requires mod to max assist to steady               High Level Balance Comments: standing at sink with A for L hand on sink for weight bearing and awareness; leaning over R foot with R hand to touch back of chair, many repititions with pt twisting and still keeping much of weight on L side, till PT placed foot on outside of his R foot and assisted with R weight shift out of his BOS;  pt c/o falling            Communication Communication Communication: No apparent difficulties Factors Affecting Communication: Reduced clarity of speech  Cognition Arousal: Alert Behavior During Therapy: WFL for tasks assessed/performed   PT - Cognitive impairments: Awareness, Attention, Problem solving, Safety/Judgement, Orientation, Initiation, Memory                        PT - Cognition Comments: decreased lt attention and awareness of deficits, poor body spatial awareness, not oriented to time, not recalling prior session, increased confusion from prior session Following commands: Impaired Following commands impaired: Follows one step commands with increased time, Follows multi-step commands inconsistently    Cueing Cueing Techniques: Verbal cues, Gestural cues, Tactile cues  Exercises Other Exercises Other Exercises: chair push ups 2x 5 reps.  Repeated sit to stands x2.  standing squats x 10 reps.    General Comments        Pertinent Vitals/Pain Pain Assessment Pain Assessment: No/denies pain    Home Living                          Prior Function            PT Goals (current goals can now be found in the care plan section) Acute Rehab PT Goals Patient Stated Goal: home PT Goal Formulation: With patient/family Potential to Achieve Goals: Good Progress towards PT goals: Progressing toward goals    Frequency    Min 2X/week      PT Plan      Co-evaluation              AM-PAC PT 6 Clicks Mobility   Outcome Measure  Help needed turning from your back to your side while in a flat bed without using bedrails?: A Little Help needed moving from lying on your back to sitting on the side of a flat bed without using bedrails?: A Little Help needed moving to and from a bed to a chair (including a wheelchair)?: A Lot Help needed standing up from a chair using your arms (e.g., wheelchair or bedside chair)?: A Lot Help needed to walk in hospital room?: Total Help needed climbing 3-5 steps with a railing? : Total 6 Click Score: 12    End of Session Equipment Utilized During Treatment: Gait belt Activity Tolerance: Patient tolerated treatment well Patient left: with call bell/phone within reach;with family/visitor present;in bed;with bed alarm set Nurse Communication: Mobility status (need for foam pad and  barrier cream) PT Visit Diagnosis: Difficulty in walking, not elsewhere classified (R26.2);Hemiplegia and hemiparesis;Muscle weakness (generalized) (M62.81) Hemiplegia - Right/Left: Left Hemiplegia - dominant/non-dominant: Non-dominant Hemiplegia - caused by: Nontraumatic intracerebral hemorrhage     Time: 8672-8597 PT Time Calculation (min) (ACUTE ONLY): 35 min  Charges:    $Therapeutic Exercise: 8-22 mins $Therapeutic Activity: 8-22 mins PT General Charges $$ ACUTE PT VISIT: 1 Visit                     Toya HAMS , PTA Acute Rehabilitation Services Office [phone]    Toya JINNY Gosling 02/23/2024, 3:04 PM

## 2024-02-23 NOTE — Plan of Care (Signed)
" °  Problem: Education: Goal: Knowledge of patient specific risk factors will improve (DELETE if not current risk factor) Outcome: Progressing   Problem: Health Behavior/Discharge Planning: Goal: Goals will be collaboratively established with patient/family Outcome: Progressing   Problem: Nutrition: Goal: Risk of aspiration will decrease Outcome: Progressing   Problem: Health Behavior/Discharge Planning: Goal: Ability to manage health-related needs will improve Outcome: Progressing   "

## 2024-02-23 NOTE — Telephone Encounter (Signed)
 Copied from CRM #[id number]. Topic: General - Other >> Feb 23, 2024 10:52 AM Thersia BROCKS wrote: Reason for CRM: Patient son, called in regarding patient condition, stated he received a missed all from the office, so would like a callback    [phone]

## 2024-02-23 NOTE — Progress Notes (Signed)
 " PROGRESS NOTE    Nathan Stafford  FMW:[id number] DOB: [birth year] DOA: 02/14/2024 PCP: Joshua Debby CROME, MD    Chief Complaint  Patient presents with   Code Stroke    Brief Narrative:  Nathan Stafford is a 73 y.o. male with hx of hypertension, hyperlipidemia, glaucoma, tobacco abuse, presenting to the emergency department for evaluation of sudden onset of left-sided weakness.  Patient noted to have left facial droop left-sided weakness and slurred speech NIH stroke scale of 8.  Imaging at that time indicated intraparenchymal hemorrhage with subarachnoid hemorrhage, right.  Neurology called for admission.   Patient transitioned to TRH service on 12/16 given improvement in symptoms, concern over possible amyloid process as etiology for noted bleed.  Echo completed per protocol without worrisome findings, remainder of patient's labs appear to be within normal limits.  Patient continues to have ongoing deficits, at this time he is being evaluated for ongoing physical therapy with plan for ultimate discharge home once he has recovered from his injury.   Assessment & Plan:   Principal Problem:   Hemorrhagic stroke (HCC) Active Problems:   HTN (hypertension)   Prediabetes   Dyslipidemia, goal LDL below 70   Dysphagia   Glaucoma   Constipation   1 intraparenchymal hemorrhage and subarachnoid hemorrhage, right, POA -Patient initially admitted to the neurology team as he had presented with left-sided weakness. -CT head done on admission with acute hemorrhage in the superior right perirolandic area, suspected both intra axial hematoma estimated 5 mm and adjacent subarachnoid blood. - CT angiogram head and neck down with acute right perirolandic intracerebral hemorrhage without CTA spot sign. -MRI brain done right frontoparietal parenchymal hematoma with mild surrounding edema and small volume adjacent subarachnoid hemorrhage. -2D echo done with EF of 55 to 60%,NWMA, normal right ventricular systolic  function, normal right ventricular size, normal mitral valve and structure, no mitral stenosis or MVR.  Normal aortic valve structure.  No aortic stenosis.. - LDL of 97 - Hemoglobin A1c 4.9. - Neurology initially suspecting CVA as etiology based on imaging and risk factors. - Patient noted to be on aspirin  prior to admission. - Neurology recommending against going on antithrombotics. -Continue Lipitor 40 mg daily - PT/OT. - Awaiting SNF placement.  -Outpatient follow-up with neurology.  2.  Hypertension  -BP improving and downtrending after initiation of hydralazine . - Continue Norvasc  10 mg daily, Toprol -XL 100 mg daily. - Hydralazine  was increased to 100 mg every 8 hours however systolic blood pressure this afternoon of 120/66 and as such we will decrease hydralazine  back to 75 mg every 8 hours.   - If blood pressure remains uncontrolled despite maximization of hydralazine  we will consider addition of low-dose ARB versus spironolactone  per guidelines..   3.  Hyperlipidemia - Continue Lipitor 40 mg daily.    4.  Dysphagia -Secondary to problem #1 - Slowly improving.   -Tolerating solid diet.   5.  Well-controlled type 2 diabetes -Hemoglobin A1c 4.9 (02/14/2024). - Outpatient follow-up.  6. glaucoma -Continue brimonidine  dorzolamide /timolol , lantanoprost  7.  Presumed thiamine deficiency -On thiamine per neurology.  8. constipation - Continue MiraLAX  twice daily, Senokot S2 tabs twice daily.  - Patient noted to have received sorbitol  with good results.     DVT prophylaxis: Lovenox  Code Status: Full Family Communication: Updated patient, son at bedside. Disposition: Patient medically stable.  SNF when bed available.   Status is: Inpatient Remains inpatient appropriate because: Severity of illness   Consultants:  Admission to neurology/stroke team  Procedures:  MRI brain 02/14/2024 2D echo 02/16/2024 CT head 02/14/2024 CT angiogram head and neck  02/14/2024  Antimicrobials:  Anti-infectives (From admission, onward)    None         Subjective: Patient sitting up in chair getting his haircut by his son.  Patient denies any chest pain or shortness of breath.  No abdominal pain.  Still with left upper extremity weakness that is slowly improving.  Tolerating current diet.  Stated had his intermittent episode of abdominal pain yesterday which has since resolved.   Objective: Vitals:   02/23/24 0349 02/23/24 0751 02/23/24 0912 02/23/24 1124  BP: 133/66 127/60 127/60 120/66  Pulse: 64 70  62  Resp: 11 16  18   Temp: 98.9 F (37.2 C) (!) 97.5 F (36.4 C)  98.3 F (36.8 C)  TempSrc: Oral Oral    SpO2: 97% 97%  99%  Weight:      Height:        Intake/Output Summary (Last 24 hours) at 02/23/2024 1212 Last data filed at 02/23/2024 0351 Gross per 24 hour  Intake --  Output 700 ml  Net -700 ml   Filed Weights   02/14/24 0700 02/14/24 0828  Weight: 85.2 kg 85 kg    Examination:  General exam: NAD. Respiratory system: Lungs clear to auscultation bilaterally.  No wheezes, no crackles, no rhonchi.  Fair air movement.  Speaking in full sentences.  Cardiovascular system: Regular rate rhythm no murmurs rubs or gallops.  No JVD.  No lower extremity edema.    Gastrointestinal system: Abdomen is soft, nontender, nondistended, positive bowel sounds.  No rebound.  No guarding.   Central nervous system: Alert and oriented.  4/5 left lower extremity strength.  5/5 right lower extremity strength and right upper extremity.  3/5 left upper extremity strength.  Improved grip strength.  Extremities: 4/5 left lower extremity strength.  5/5 right upper and right lower extremity strength.  3/5 left upper extremity strength. Skin: No rashes, lesions or ulcers Psychiatry: Judgement and insight appear normal. Mood & affect appropriate.     Data Reviewed: I have personally reviewed following labs and imaging studies  CBC: Recent Labs  Lab  02/20/24 0614  WBC 7.0  HGB 16.2  HCT 48.3  MCV 87.8  PLT PLATELET CLUMPS NOTED ON SMEAR, COUNT APPEARS ADEQUATE    Basic Metabolic Panel: Recent Labs  Lab 02/17/24 0224 02/20/24 0614  NA 138 136  K 4.1 3.8  CL 108 106  CO2 24 19*  GLUCOSE 101* 107*  BUN 13 12  CREATININE 0.84 0.79  CALCIUM  8.8* 9.5  MG  --  2.0    GFR: Estimated Creatinine Clearance: 94.2 mL/min (by C-G formula based on SCr of 0.79 mg/dL).  Liver Function Tests: No results for input(s): AST, ALT, ALKPHOS, BILITOT, PROT, ALBUMIN in the last 168 hours.   CBG: No results for input(s): GLUCAP in the last 168 hours.    Recent Results (from the past 240 hours)  MRSA Next Gen by PCR, Nasal     Status: None   Collection Time: 02/14/24  5:31 PM   Specimen: Nasal Mucosa; Nasal Swab  Result Value Ref Range Status   MRSA by PCR Next Gen NOT DETECTED NOT DETECTED Final    Comment: (NOTE) The GeneXpert MRSA Assay (FDA approved for NASAL specimens only), is one component of a comprehensive MRSA colonization surveillance program. It is not intended to diagnose MRSA infection nor to guide or monitor treatment for MRSA infections. Test performance is  not FDA approved in patients less than 73 years old. Performed at [facility], [address], [place], [place] [address]          Radiology Studies: No results found.      Scheduled Meds:  amLODipine   10 mg Oral Daily   atorvastatin   40 mg Oral Daily   brimonidine   1 drop Both Eyes BID   dorzolamide -timolol   1 drop Both Eyes BID   enoxaparin  (LOVENOX ) injection  40 mg Subcutaneous Q24H   fenofibrate   160 mg Oral Daily   hydrALAZINE   100 mg Oral Q8H   latanoprost   1 drop Both Eyes QHS   metoprolol  succinate  100 mg Oral Daily   nicotine   21 mg Transdermal Daily   pantoprazole   40 mg Oral QHS   polyethylene glycol  17 g Oral BID   senna-docusate  2 tablet Oral BID   sodium chloride  flush  3 mL Intravenous Once    Continuous Infusions:   LOS: 9 days    Time spent: 40 minutes    Toribio Hummer, MD [facility]   To contact the attending provider between 7A-7P or the covering provider during after hours 7P-7A, please log into the web site [email or web address] and access using universal [employer] password for that web site. If you do not have the password, please call the hospital operator.  02/23/2024, 12:12 PM    "

## 2024-02-23 NOTE — TOC Progression Note (Signed)
 Transition of Care [facility]) - Progression Note    Patient Details  Name: Nathan Stafford MRN: [id number] Date of Birth: [birth year]  Transition of [facility]) CM/SW Contact  Almarie CHRISTELLA Goodie, [place] Phone Number: 02/23/2024, 10:05 AM  Clinical Narrative:   Patient's insurance authorization remains pending at this time, pending clinical review. CSW updated [facility]. CSW to follow.  UPDATE: CSW contacted by CMA that insurance was asking for clinical updates. CSW coordinated with PT and OT for updated notes to send to insurance, and CSW updated CMA that notes were entered. CSW also met with patient's son at bedside, he was asking about who patient's PCP was for follow-up appointments. Information provided for son to follow up. CSW to follow.    Expected Discharge Plan: Skilled Nursing Facility Barriers to Discharge: Continued Medical Work up, English As A Second Language Teacher               Expected Discharge Plan and Services                                               Social Drivers of Health (SDOH) Interventions SDOH Screenings   Food Insecurity: Food Insecurity Present (02/16/2024)  Housing: Low Risk (02/16/2024)  Transportation Needs: No Transportation Needs (02/16/2024)  Utilities: Not At Risk (02/16/2024)  Alcohol Screen: Low Risk (07/30/2023)  Depression (PHQ2-9): Low Risk (07/30/2023)  Financial Resource Strain: Medium Risk (07/30/2023)  Physical Activity: Sufficiently Active (07/30/2023)  Social Connections: Socially Isolated (02/16/2024)  Stress: No Stress Concern Present (07/30/2023)  Tobacco Use: High Risk (02/14/2024)  Health Literacy: Adequate Health Literacy (07/30/2023)    Readmission Risk Interventions     No data to display

## 2024-02-23 NOTE — Progress Notes (Signed)
" °   02/23/24 1400  Spiritual Encounters  Type of Visit Follow up  Care provided to: Pt and family  Reason for visit Advance directives   Chaplain made a follow up visit. The AD document was notarized by the notary and two witnesses. Copies were given to the patient's son, and the document was uploaded to the system.    M.Kubra Susanna Kerry Resident [phone] "

## 2024-02-24 DIAGNOSIS — R1310 Dysphagia, unspecified: Secondary | ICD-10-CM | POA: Diagnosis not present

## 2024-02-24 DIAGNOSIS — K5900 Constipation, unspecified: Secondary | ICD-10-CM | POA: Diagnosis not present

## 2024-02-24 DIAGNOSIS — I10 Essential (primary) hypertension: Secondary | ICD-10-CM | POA: Diagnosis not present

## 2024-02-24 DIAGNOSIS — E785 Hyperlipidemia, unspecified: Secondary | ICD-10-CM | POA: Diagnosis not present

## 2024-02-24 DIAGNOSIS — H409 Unspecified glaucoma: Secondary | ICD-10-CM | POA: Diagnosis not present

## 2024-02-24 DIAGNOSIS — I619 Nontraumatic intracerebral hemorrhage, unspecified: Secondary | ICD-10-CM | POA: Diagnosis not present

## 2024-02-24 LAB — BASIC METABOLIC PANEL WITH GFR
Anion gap: 9 (ref 5–15)
BUN: 16 mg/dL (ref 8–23)
CO2: 24 mmol/L (ref 22–32)
Calcium: 9.1 mg/dL (ref 8.9–10.3)
Chloride: 106 mmol/L (ref 98–111)
Creatinine, Ser: 0.87 mg/dL (ref 0.61–1.24)
GFR, Estimated: >60 mL/min
Glucose, Bld: 112 mg/dL — ABNORMAL HIGH (ref 70–99)
Potassium: 3.6 mmol/L (ref 3.5–5.1)
Sodium: 139 mmol/L (ref 135–145)

## 2024-02-24 MED ORDER — AMLODIPINE BESYLATE 10 MG PO TABS: 10.0000 mg | ORAL_TABLET | Freq: Every day | ORAL | Status: AC

## 2024-02-24 MED ORDER — ATORVASTATIN CALCIUM 40 MG PO TABS: 40.0000 mg | ORAL_TABLET | Freq: Every day | ORAL | Status: AC

## 2024-02-24 MED ORDER — POLYETHYLENE GLYCOL 3350 17 G PO PACK: 17.0000 g | PACK | Freq: Two times a day (BID) | ORAL | Status: AC

## 2024-02-24 MED ORDER — ACETAMINOPHEN 325 MG PO TABS: 650.0000 mg | ORAL_TABLET | ORAL | Status: AC | PRN

## 2024-02-24 MED ORDER — HYDRALAZINE HCL 25 MG PO TABS: 75.0000 mg | ORAL_TABLET | Freq: Three times a day (TID) | ORAL | Status: AC

## 2024-02-24 MED ORDER — SENNOSIDES-DOCUSATE SODIUM 8.6-50 MG PO TABS: 2.0000 | ORAL_TABLET | Freq: Two times a day (BID) | ORAL | Status: AC

## 2024-02-24 MED ORDER — NICOTINE 21 MG/24HR TD PT24: 21.0000 mg | MEDICATED_PATCH | Freq: Every day | TRANSDERMAL | Status: AC

## 2024-02-24 MED ORDER — PANTOPRAZOLE SODIUM 40 MG PO TBEC: 40.0000 mg | DELAYED_RELEASE_TABLET | Freq: Every day | ORAL | Status: AC

## 2024-02-24 MED ORDER — POTASSIUM CHLORIDE CRYS ER 10 MEQ PO TBCR
40.0000 meq | EXTENDED_RELEASE_TABLET | Freq: Once | ORAL | Status: AC
  Administered 2024-02-24: 40 meq via ORAL
  Filled 2024-02-24: qty 4

## 2024-02-24 MED ORDER — FENOFIBRATE 160 MG PO TABS: 160.0000 mg | ORAL_TABLET | Freq: Every day | ORAL | Status: AC

## 2024-02-24 NOTE — TOC Transition Note (Signed)
 Transition of Care [facility]) - Discharge Note   Patient Details  Name: Nathan Stafford MRN: [id number] Date of Birth: 1950/03/06  Transition of Care [facility]) CM/SW Contact:  Almarie CHRISTELLA Goodie, LCSW Phone Number: 02/24/2024, 1:08 PM   Clinical Narrative:   Patient received insurance approval and [facility] a bed [facility]. CSW spoke with patient's son, Shirline, and he is in agreement. Patient medically stable, discharge information sent to [facility]. Transport arranged with [facility] for next available.  Nurse to call report to [phone], Room 122A.    Final next level of care: Skilled Nursing Facility Barriers to Discharge: Barriers Resolved   Patient Goals and CMS Choice            Discharge Placement              Patient chooses bed at:  [facility]) Patient to be transferred to facility by: [facility] Name of family member notified: Shirline Patient and family notified of of transfer: 02/24/24  Discharge Plan and Services Additional resources added to the After Visit Summary for                                       Social Drivers of Health (SDOH) Interventions SDOH Screenings   Food Insecurity: Food Insecurity Present (02/16/2024)  Housing: Low Risk (02/16/2024)  Transportation Needs: No Transportation Needs (02/16/2024)  Utilities: Not At Risk (02/16/2024)  Alcohol Screen: Low Risk (07/30/2023)  Depression (PHQ2-9): Low Risk (07/30/2023)  Financial Resource Strain: Medium Risk (07/30/2023)  Physical Activity: Sufficiently Active (07/30/2023)  Social Connections: Socially Isolated (02/16/2024)  Stress: No Stress Concern Present (07/30/2023)  Tobacco Use: High Risk (02/14/2024)  Health Literacy: Adequate Health Literacy (07/30/2023)     Readmission Risk Interventions     No data to display

## 2024-02-24 NOTE — Discharge Summary (Signed)
 Physician Discharge Summary  Nathan Stafford FMW:[id number] DOB: [birth year] DOA: 02/14/2024  PCP: Nathan Debby CROME, MD  Admit date: 02/14/2024 Discharge date: 02/24/2024  Time spent: 60 minutes  Recommendations for Outpatient Follow-up:  Follow-up with Dr. Rosemarie, neurology in 4 weeks. Follow-up with MD at skilled nursing facility.  Patient will need a basic metabolic profile done in 1 week to follow-up on electrolytes and renal function.  Patient blood pressure will also need to be followed up upon.   Discharge Diagnoses:  Principal Problem:   Hemorrhagic stroke [facility]) Active Problems:   HTN (hypertension)   Prediabetes   Dyslipidemia, goal LDL below 70   Dysphagia   Glaucoma   Constipation   Discharge Condition: Stable and improved  Diet recommendation: Heart healthy  Filed Weights   02/14/24 0700 02/14/24 0828  Weight: 85.2 kg 85 kg    History of present illness:  HPI per NathanArora Stafford Nathan is a 73 y.o. male with hx of hypertension, hyperlipidemia, glaucoma, tobacco abuse, presenting to the emergency department for evaluation of sudden onset of left-sided weakness.  He wentoutside to smoke after getting up this morning around 6 AM and was normal.  He sat down to watch TV and around 6:45 AM wanted to readjust himself in the couch but could not move the left side.  EMS was called.  They noted left-sided weakness and activated a code stroke for sudden onset of focal neurological deficit. He was seen at the ED bridge taken for a stat CT that showed a bleed-see details below. He continues to smoke.  He says drinks occasionally but later caretaker comes in and says that he drinks a lot usually.  Had a shot of liquor yesterday. Systolic blood pressure in the 190s on arrival Heart rate in the 60s No headache.  No nausea vomiting   Last known well: 6 AM Modified rankin score: 0-Completely asymptomatic and back to baseline post- stroke ICH Score: 0 tNKASE: Not offered due to  ICH Thrombectomy: not offered due to ICH          NIHSS components Score: Comment  1a Level of Conscious 0[x]  1[]  2[]  3[]         1b LOC Questions 0[x]  1[]  2[]           1c LOC Commands 0[x]  1[]  2[]           2 Best Gaze 0[x]  1[]  2[]           3 Visual 0[x]  1[]  2[]  3[]         4 Facial Palsy 0[]  1[x]  2[]  3[]         5a Motor Arm - left 0[]  1[]  2[x]  3[]  4[]  UN[]     5b Motor Arm - Right 0[x]  1[]  2[]  3[]  4[]  UN[]     6a Motor Leg - Left 0[]  1[]  2[x]  3[]  4[]  UN[]     6b Motor Leg - Right 0[x]  1[]  2[]  3[]  4[]  UN[]     7 Limb Ataxia 0[x]  1[]  2[]  UN[]         8 Sensory 0[]  1[]  2[x]  UN[]         9 Best Language 0[x]  1[]  2[]  3[]         10 Dysarthria 0[]  1[x]  2[]  UN[]         11 Extinct. and Inattention 0[x]  1[]  2[]           TOTAL: 8               Hospital Course:  1 intraparenchymal hemorrhage and subarachnoid hemorrhage, right, POA -  Patient initially admitted to the neurology team as he had presented with left-sided weakness. -CT head done on admission with acute hemorrhage in the superior right perirolandic area, suspected both intra axial hematoma estimated 5 mm and adjacent subarachnoid blood. - CT angiogram head and neck down with acute right perirolandic intracerebral hemorrhage without CTA spot sign. -MRI brain done right frontoparietal parenchymal hematoma with mild surrounding edema and small volume adjacent subarachnoid hemorrhage. -2D echo done with EF of 55 to 60%,NWMA, normal right ventricular systolic function, normal right ventricular size, normal mitral valve and structure, no mitral stenosis or MVR.  Normal aortic valve structure.  No aortic stenosis.. - LDL of 97 - Hemoglobin A1c 4.9. - Neurology initially suspecting CVA as etiology based on imaging and risk factors. - Patient noted to be on aspirin  prior to admission. - Neurology recommended against going on antithrombotics. - Patient maintained on Lipitor 40 mg daily.   - Patient's home regimen of aspirin  will be discontinued on  discharge.  -Patient seen by PT/ OT who recommended SNF placement.   - Patient with discharge to skilled nursing facility.   - Outpatient follow-up with neurology.    2.  Hypertension  -BP improving and downtrending after initiation of hydralazine . - Patient maintained on Norvasc  10 mg daily, Toprol -XL 100 mg daily. - Hydralazine  was increased to 100 mg every 8 hours however systolic blood pressure on 02/23/2024 was noted at 120/66 and as such hydralazine  was decreased down to 75 mg every 8 hours.   - Patient will be discharged on current regimen of hydralazine  75 mg p.o. every 8 hours, Toprol -XL 100 mg daily, Norvasc  10 mg daily.   - Outpatient follow-up with PCP.     3.  Hyperlipidemia - Patient placed on Lipitor 40 mg daily.     4.  Dysphagia -Secondary to problem #1 - Slowly improving.   -Patient's diet was advanced to a solid diet which he was tolerating. -Outpatient follow-up with SLP.   5.  Well-controlled type 2 diabetes -Hemoglobin A1c 4.9 (02/14/2024). - Outpatient follow-up.   6. glaucoma - Patient maintained on home eyedrops of brimonidine  dorzolamide /timolol , lantanoprost   7.  Presumed thiamine deficiency - Patient placed on thiamine per neurology.   8. constipation - Patient maintained on bowel regimen of MiraLAX  twice daily, Senokot S2 tabs twice daily.   - Outpatient follow-up.       Procedures: MRI brain 02/14/2024 2D echo 02/16/2024 CT head 02/14/2024 CT angiogram head and neck 02/14/2024  Consultations: Admitted to neurology/stroke team  Discharge Exam: Vitals:   02/24/24 0924 02/24/24 1113  BP: (!) 125/56 121/62  Pulse: 60 (!) 58  Resp: 18 18  Temp: 98.6 F (37 C) 97.9 F (36.6 C)  SpO2: 98% 100%    General: NAD Cardiovascular: RRR no murmurs rubs or gallops.  No JVD.  No lower extremity edema. Respiratory: Clear to auscultation bilaterally.  No wheezes, no crackles, no rhonchi.  Fair air movement.  Speaking in full  sentences.  Discharge Instructions   Discharge Instructions     Ambulatory referral to Neurology   Complete by: As directed    An appointment is requested in approximately: 4 weeks   Diet - low sodium heart healthy   Complete by: As directed    Increase activity slowly   Complete by: As directed       Allergies as of 02/24/2024       Reactions   Ace Inhibitors Swelling   Flexeril  [cyclobenzaprine ] Nausea Only  Medication List     PAUSE taking these medications    spironolactone  25 MG tablet Wait to take this until your doctor or other care provider tells you to start again. Commonly known as: ALDACTONE  Take 1 tablet (25 mg total) by mouth daily.       STOP taking these medications    aspirin  81 MG tablet   simvastatin  20 MG tablet Commonly known as: ZOCOR        TAKE these medications    acetaminophen  325 MG tablet Commonly known as: TYLENOL  Take 2 tablets (650 mg total) by mouth every 4 (four) hours as needed for mild pain (pain score 1-3) or fever (or temp > 37.5 C (99.5 F)).   amLODipine  10 MG tablet Commonly known as: NORVASC  Take 1 tablet (10 mg total) by mouth daily. Start taking on: February 25, 2024   atorvastatin  40 MG tablet Commonly known as: LIPITOR Take 1 tablet (40 mg total) by mouth daily. Start taking on: February 25, 2024   Blood Pressure Kit 1 kit by Does not apply route daily.   brimonidine  0.2 % ophthalmic solution Commonly known as: ALPHAGAN  Place 1 drop into both eyes 2 (two) times daily.   cyclobenzaprine  10 MG tablet Commonly known as: FLEXERIL  Take 1 tablet (10 mg total) by mouth 2 (two) times daily as needed for muscle spasms.   dorzolamide -timolol  2-0.5 % ophthalmic solution Commonly known as: COSOPT  Place 1 drop into both eyes 2 (two) times daily.   fenofibrate  160 MG tablet Take 1 tablet (160 mg total) by mouth daily. Start taking on: February 25, 2024   hydrALAZINE  25 MG tablet Commonly known as:  APRESOLINE  Take 3 tablets (75 mg total) by mouth every 8 (eight) hours.   latanoprost  0.005 % ophthalmic solution Commonly known as: XALATAN  Place 1 drop into both eyes at bedtime.   metoprolol  succinate 100 MG 24 hr tablet Commonly known as: TOPROL -XL Take 1 tablet by mouth once daily   nicotine  21 mg/24hr patch Commonly known as: NICODERM CQ  - dosed in mg/24 hours Place 1 patch (21 mg total) onto the skin daily. Start taking on: February 25, 2024   pantoprazole  40 MG tablet Commonly known as: PROTONIX  Take 1 tablet (40 mg total) by mouth at bedtime.   polyethylene glycol 17 g packet Commonly known as: MIRALAX  / GLYCOLAX  Take 17 g by mouth 2 (two) times daily.   senna-docusate 8.6-50 MG tablet Commonly known as: Senokot-S Take 2 tablets by mouth 2 (two) times daily.   sildenafil  50 MG tablet Commonly known as: VIAGRA  TAKE 1 TABLET BY MOUTH ONCE DAILY AS NEEDED FOR ERECTILE DYSFUNCTION   thiamine 50 MG tablet Commonly known as: VITAMIN B-1 Take 1 tablet (50 mg total) by mouth daily.       Allergies[1]  Follow-up Information     Nathan Stafford Eather RAMAN, MD. Schedule an appointment as soon as possible for a visit in 4 week(s).   Specialties: Neurology, Radiology Contact information: [address] [place] [address] [phone]         MD at SNF Follow up.                   The results of significant diagnostics from this hospitalization (including imaging, microbiology, ancillary and laboratory) are listed below for reference.    Significant Diagnostic Studies: ECHOCARDIOGRAM COMPLETE Result Date: 02/16/2024    ECHOCARDIOGRAM REPORT   Patient Name:   SHAMON COTHRAN Date of Exam: 02/16/2024 Medical Rec #:  [id number]    Height:       74.0 in Accession #:    [id number]   Weight:       187.4 lb Date of Birth:  1950/04/12     BSA:          2.114 m Patient Age:    73 years     BP:           137/84 mmHg Patient Gender: M            HR:           66  bpm. Exam Location:  Inpatient Procedure: 2D Echo, Cardiac Doppler and Color Doppler (Both Spectral and Color            Flow Doppler were utilized during procedure). Indications:    Stroke I63.9  History:        Patient has prior history of Echocardiogram examinations, most                 recent 08/21/2023. Signs/Symptoms:Hypertensive Heart Disease.  Sonographer:    Nathanel Devonshire Referring Phys: [id number] ASHISH ARORA IMPRESSIONS  1. Left ventricular ejection fraction, by estimation, is 55 to 60%. Left ventricular ejection fraction by 2D MOD biplane is 56.5 %. The left ventricle has normal function. The left ventricle has no regional wall motion abnormalities. Left ventricular diastolic parameters were normal.  2. Right ventricular systolic function is normal. The right ventricular size is normal. There is normal pulmonary artery systolic pressure.  3. The mitral valve is normal in structure. No evidence of mitral valve regurgitation. No evidence of mitral stenosis.  4. The aortic valve is normal in structure. Aortic valve regurgitation is not visualized. No aortic stenosis is present.  5. The inferior vena cava is normal in size with greater than 50% respiratory variability, suggesting right atrial pressure of 3 mmHg. Comparison(s): No prior Echocardiogram. FINDINGS  Left Ventricle: Left ventricular ejection fraction, by estimation, is 55 to 60%. Left ventricular ejection fraction by 2D MOD biplane is 56.5 %. The left ventricle has normal function. The left ventricle has no regional wall motion abnormalities. The left ventricular internal cavity size was normal in size. There is no left ventricular hypertrophy. Left ventricular diastolic parameters were normal. Right Ventricle: The right ventricular size is normal. No increase in right ventricular wall thickness. Right ventricular systolic function is normal. There is normal pulmonary artery systolic pressure. Left Atrium: Left atrial size was normal in size. Right  Atrium: Right atrial size was normal in size. Pericardium: There is no evidence of pericardial effusion. Mitral Valve: The mitral valve is normal in structure. No evidence of mitral valve regurgitation. No evidence of mitral valve stenosis. Tricuspid Valve: The tricuspid valve is normal in structure. Tricuspid valve regurgitation is not demonstrated. No evidence of tricuspid stenosis. Aortic Valve: The aortic valve is normal in structure. Aortic valve regurgitation is not visualized. No aortic stenosis is present. Aortic valve mean gradient measures 4.0 mmHg. Aortic valve peak gradient measures 7.2 mmHg. Aortic valve area, by VTI measures 3.50 cm. Pulmonic Valve: The pulmonic valve was normal in structure. Pulmonic valve regurgitation is not visualized. No evidence of pulmonic stenosis. Aorta: The aortic root is normal in size and structure. Venous: The inferior vena cava is normal in size with greater than 50% respiratory variability, suggesting right atrial pressure of 3 mmHg. IAS/Shunts: No atrial level shunt detected by color flow Doppler.  LEFT VENTRICLE PLAX 2D  Biplane EF (MOD) LVIDd:         5.10 cm         LV Biplane EF:   Left LVIDs:         3.30 cm                          ventricular LV PW:         1.10 cm                          ejection LV IVS:        1.00 cm                          fraction by LVOT diam:     2.30 cm                          2D MOD LV SV:         92                               biplane is LV SV Index:   43                               56.5 %. LVOT Area:     4.15 cm LV IVRT:       88 msec         Diastology                                LV e' medial:    2.83 cm/s                                LV E/e' medial:  13.0 LV Volumes (MOD)               LV e' lateral:   4.13 cm/s LV vol d, MOD    77.8 ml       LV E/e' lateral: 8.9 A2C: LV vol d, MOD    104.0 ml A4C: LV vol s, MOD    30.1 ml A2C: LV vol s, MOD    45.5 ml A4C: LV SV MOD A2C:   47.7 ml LV SV MOD A4C:    104.0 ml LV SV MOD BP:    51.4 ml RIGHT VENTRICLE             IVC RV Basal diam:  4.10 cm     IVC diam: 1.60 cm RV S prime:     11.20 cm/s TAPSE (M-mode): 1.8 cm      PULMONARY VEINS                             Diastolic Velocity: 39.40 cm/s                             S/D Velocity:       0.90  Systolic Velocity:  36.40 cm/s LEFT ATRIUM             Index        RIGHT ATRIUM           Index LA diam:        3.10 cm 1.47 cm/m   RA Area:     13.80 cm LA Vol (A2C):   37.0 ml 17.50 ml/m  RA Volume:   37.20 ml  17.60 ml/m LA Vol (A4C):   34.9 ml 16.51 ml/m LA Biplane Vol: 38.3 ml 18.12 ml/m  AORTIC VALVE AV Area (Vmax):    2.98 cm AV Area (Vmean):   2.86 cm AV Area (VTI):     3.50 cm AV Vmax:           134.00 cm/s AV Vmean:          87.900 cm/s AV VTI:            0.262 m AV Peak Grad:      7.2 mmHg AV Mean Grad:      4.0 mmHg LVOT Vmax:         96.00 cm/s LVOT Vmean:        60.600 cm/s LVOT VTI:          0.221 m LVOT/AV VTI ratio: 0.84  AORTA Ao Root diam: 3.90 cm Ao Asc diam:  3.00 cm MITRAL VALVE MV Area (PHT): 2.56 cm     SHUNTS MV Decel Time: 296 msec     Systemic VTI:  0.22 m MV E velocity: 36.80 cm/s   Systemic Diam: 2.30 cm MV A velocity: 102.00 cm/s MV E/A ratio:  0.36 Franck Azobou Tonleu Electronically signed by Joelle Cedars Tonleu Signature Date/Time: 02/16/2024/9:26:55 AM    Final    MR BRAIN W WO CONTRAST Result Date: 02/14/2024 EXAM: MRI BRAIN WITH AND WITHOUT CONTRAST 02/14/2024 02:36:03 PM TECHNIQUE: Multiplanar multisequence MRI of the head/brain was performed with and without the administration of intravenous contrast (8 mL of Gadavist ). COMPARISON: Head CT and CTA 02/14/2024. CLINICAL HISTORY: Hemorrhagic stroke. FINDINGS: BRAIN AND VENTRICLES: A right frontoparietal parenchymal hematoma in the medial perirolandic region has not significantly changed from today's earlier CT, measuring approximately 3.3 x 1.6 x 2.1 cm. There is mild surrounding edema without  significant mass effect. Small volume adjacent subarachnoid hemorrhage is again noted. There is no associated abnormal enhancement to indicate an underlying mass. Numerous chronic microhemorrhages are present predominantly peripherally throughout both cerebral hemispheres, with only minimal involvement of the deep gray nuclei and cerebellum. A 1.4 cm region of cortical and subcortical T2 hyperintensity laterally in the right temporal lobe is without abnormal enhancement or restricted diffusion. Patchy T2 hyperintensities in the cerebral white matter bilaterally are nonspecific but compatible with mild to moderate chronic small vessel ischemic disease. Chronic lacunar infarcts are noted in the cerebral white matter bilaterally as well as in the right thalamus. No acute infarct, midline shift, hydrocephalus, or extra-axial fluid collection is identified. There is mild cerebral atrophy. Major intracranial vascular flow voids are preserved. ORBITS: No acute abnormality. SINUSES: No acute abnormality. BONES AND SOFT TISSUES: Normal bone marrow signal and enhancement. 4 cm right frontal scalp lipoma. IMPRESSION: 1. Unchanged right frontoparietal parenchymal hematoma with mild surrounding edema and small volume adjacent subarachnoid hemorrhage. No abnormal enhancement to indicate an underlying mass. 2. 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 3. Mild to moderate chronic small vessel ischemic disease. 4. Small focus of cortical and subcortical  T2 hyperintensity/edema in the posterior right temporal lobe without enhancement, indeterminate though could reflect a subacute infarct. Electronically signed by: Dasie Hamburg MD 02/14/2024 03:15 PM EST RP Workstation: [id number]   CT ANGIO HEAD NECK W WO CM (CODE STROKE) Result Date: 02/14/2024 EXAM: CTA Head and Neck with Intravenous Contrast. CLINICAL HISTORY: 73 year old male with stroke, follow up. Code stroke presentation and  acute right perirolandic brain hemorrhage. TECHNIQUE: Axial CTA images of the head and neck performed with intravenous contrast. MIP reconstructed images were created and reviewed. Axial computed tomography images of the head/brain performed without intravenous contrast. Note: Per PQRS, the description of internal carotid artery percent stenosis, including 0 percent or normal exam, is based on [place] American Symptomatic Carotid Endarterectomy Trial (NASCET) criteria. Dose reduction technique was used including one or more of the following: automated exposure control, adjustment of mA and kV according to patient size, and/or iterative reconstruction. CONTRAST: With and without. 75 mL (iohexol  (OMNIPAQUE ) 350 MG/ML injection 75 mL IOHEXOL  350 MG/ML SOLN). COMPARISON: Plain head CT today reported separately. FINDINGS: CTA NECK: COMMON CAROTID ARTERIES: Calcified right CCA and right carotid bifurcation atherosclerosis with no stenosis. Calcified left CCA and proximal left ICA atherosclerosis without stenosis. No dissection or occlusion. INTERNAL CAROTID ARTERIES: Right ICA siphon atherosclerosis and stenosis in the cavernous segment. Left ICA siphon moderate calcified atherosclerosis with tortuosity, mild left ICA siphon stenosis. No dissection or occlusion. VERTEBRAL ARTERIES: Mild proximal right subclavian and right vertebral artery origin atherosclerosis without stenosis. Tortuous right vertebral artery in the neck. No right vertebral artery stenosis to the vertebrobasilar junction. Normal right PICA origin. Proximal left subclavian atherosclerosis is complex with soft and calcified plaque, but less than 50% stenosis results. Involvement of the left vertebral artery origin but no significant stenosis (series 8 image 120). Left vertebral artery is tortuous and mildly dominant. No left vertebral artery stenosis. Normal left PICA origin. No dissection or occlusion. CTA HEAD: ANTERIOR CEREBRAL ARTERIES: Normal ACA  origins. Tortuous A1 segments. Normal anterior communicating artery. No significant stenosis. No occlusion. No aneurysm. MIDDLE CEREBRAL ARTERIES: Normal MCA origins. Tortuous right MCA bifurcation or trifurcation. No significant stenosis. No occlusion. No aneurysm. POSTERIOR CEREBRAL ARTERIES: Normal PCA origins. Diminutive or absent posterior communicating arteries. No significant stenosis. No occlusion. No aneurysm. BASILAR ARTERY: Mild calcified plaque of the basilar artery without stenosis. Normal SCA origins. No occlusion. No aneurysm. OTHER: Dedicated delayed postcontrast images were obtained by neurology request. No suspicious parenchymal enhancement. The major dural venous sinuses are enhancing and patent. No abnormal tangle of vessels or vascular malformation. SOFT TISSUES: Heterogeneous thyroid  enlargement with a mixed density 2 cm partially exophytic right thyroid  nodule on series 7 image 140. This meets consensus criteria for nonemergent follow up thyroid  ultrasound characterization. Otherwise negative nonvascular neck soft tissue spaces. Probable IV access related small volume intravenous gas at the bilateral subclavian veins. BONES: Cervical spine degeneration superimposed on diffuse idiopathic skeletal hyperostosis. Widespread interbody ankylosis from the mid cervical through the upper thoracic spine. No acute osseous abnormality. LUNGS: Centrilobular emphysema in the upper lungs. VASCULATURE: Calcified aortic arch atherosclerosis. 3 vessel arch configuration. IMPRESSION: 1. Acute right perirolandic intracerebral hemorrhage without CTA spot sign or other associated vascular abnormality on arterial or delayed phases. 2. Moderate calcified right ICA siphon atherosclerosis and cavernous segment stenosis. Generally mild forage atherosclerosis otherwise. No other hemodynamically significant stenosis. 3. Heterogeneous thyroid  enlargement with a mixed density right thyroid  nodule (2.0 cm) meets criteria for  non-emergent thyroid  ultrasound. 4. Emphysema. Diffuse idiopathic skeletal hyperostosis.  Electronically signed by: Helayne Hurst MD 02/14/2024 08:31 AM EST RP Workstation: [id number]   CT HEAD CODE STROKE WO CONTRAST Result Date: 02/14/2024 EXAM: CT HEAD WITHOUT CONTRAST 02/08/2022 TECHNIQUE: CT of the head was performed without the administration of intravenous contrast. Automated exposure control, iterative reconstruction, and/or weight based adjustment of the mA/kV was utilized to reduce the radiation dose to as low as reasonably achievable. COMPARISON: Head CT from 01/23/2022. CLINICAL HISTORY: 73 year old male with code stroke presentation. FINDINGS: BRAIN AND VENTRICLES: Acute hemorrhage in the superior right perirolandic cortex and subcortical white matter. This is hyperdense and appears to be a combined intra-axial hematoma and small volume adjacent subarachnoid hemorrhage (series 2 images 24 and 26). The intra-axial component of hemorrhage is estimated at 33 x 14 x 22 mm (AP x transverse x CC), with an estimated volume of 5 mL. Regional subarachnoid hemorrhage is present, but no basilar cistern subarachnoid hemorrhage. No intraventricular hemorrhage or ventriculomegaly. Mild peripheral edema is noted at the hemorrhage site. There is no significant intracranial mass effect or midline shift. Elsewhere, gray-white differentiation appears stable. Patchy chronic white matter hypodensity is present bilaterally. Calcified atherosclerotic plaque is noted at the skull base. ORBITS: No acute abnormality. SINUSES: Paranasal sinuses, tympanic cavities, and mastoids remain well aerated. SOFT TISSUES AND SKULL: Incidental right anterior vertex benign scalp lipoma is again noted. No skull fracture. [place] Stroke Program Early CT Score (ASPECTS): not applicable, acute hemorrhage . IMPRESSION: 1. Acute hemorrhage at the superior right perirolandic area, suspected both intra-axial hematoma (estimated 5 mL) and adjacent  subarachnoid blood. Mild edema. No significant mass effect or midline shift. 2. No other acute intracranial abnormality identified. 3. These results were communicated to Dr. Arora at 08:04 hours on 01/23/2022 by text page via the [facility] messaging system. Electronically signed by: Helayne Hurst MD 02/14/2024 08:07 AM EST RP Workstation: [id number]    Microbiology: Recent Results (from the past 240 hours)  MRSA Next Gen by PCR, Nasal     Status: None   Collection Time: 02/14/24  5:31 PM   Specimen: Nasal Mucosa; Nasal Swab  Result Value Ref Range Status   MRSA by PCR Next Gen NOT DETECTED NOT DETECTED Final    Comment: (NOTE) The GeneXpert MRSA Assay (FDA approved for NASAL specimens only), is one component of a comprehensive MRSA colonization surveillance program. It is not intended to diagnose MRSA infection nor to guide or monitor treatment for MRSA infections. Test performance is not FDA approved in patients less than 14 years old. Performed at [facility], [address], [place], [place] [address]      Labs: Basic Metabolic Panel: Recent Labs  Lab 02/20/24 0614 02/24/24 0144  NA 136 139  K 3.8 3.6  CL 106 106  CO2 19* 24  GLUCOSE 107* 112*  BUN 12 16  CREATININE 0.79 0.87  CALCIUM  9.5 9.1  MG 2.0  --    Liver Function Tests: No results for input(s): AST, ALT, ALKPHOS, BILITOT, PROT, ALBUMIN in the last 168 hours. No results for input(s): LIPASE, AMYLASE in the last 168 hours. No results for input(s): AMMONIA in the last 168 hours. CBC: Recent Labs  Lab 02/20/24 0614  WBC 7.0  HGB 16.2  HCT 48.3  MCV 87.8  PLT PLATELET CLUMPS NOTED ON SMEAR, COUNT APPEARS ADEQUATE   Cardiac Enzymes: No results for input(s): CKTOTAL, CKMB, CKMBINDEX, TROPONINI in the last 168 hours. BNP: BNP (last 3 results) No results for input(s): BNP in the last 8760 hours.  ProBNP (last 3 results) Recent Labs    08/21/23 0939  PROBNP 53.0     CBG: No results for input(s): GLUCAP in the last 168 hours.     Signed:  Toribio Hummer MD.  [facility] 02/24/2024, 12:27 PM       [1]  Allergies Allergen Reactions   Ace Inhibitors Swelling   Flexeril  [Cyclobenzaprine ] Nausea Only

## 2024-02-24 NOTE — Plan of Care (Signed)

## 2024-02-27 ENCOUNTER — Emergency Department (HOSPITAL_COMMUNITY)

## 2024-02-27 ENCOUNTER — Other Ambulatory Visit: Payer: Self-pay

## 2024-02-27 ENCOUNTER — Encounter (HOSPITAL_COMMUNITY): Payer: Self-pay

## 2024-02-27 ENCOUNTER — Emergency Department (HOSPITAL_COMMUNITY)
Admission: EM | Admit: 2024-02-27 | Discharge: 2024-02-27 | Disposition: A | Discharge status: Home / Self Care | Attending: Emergency Medicine | Admitting: Emergency Medicine

## 2024-02-27 DIAGNOSIS — F0390 Unspecified dementia without behavioral disturbance: Secondary | ICD-10-CM | POA: Insufficient documentation

## 2024-02-27 DIAGNOSIS — M542 Cervicalgia: Secondary | ICD-10-CM | POA: Diagnosis not present

## 2024-02-27 DIAGNOSIS — S0101XA Laceration without foreign body of scalp, initial encounter: Secondary | ICD-10-CM | POA: Diagnosis not present

## 2024-02-27 DIAGNOSIS — R1032 Left lower quadrant pain: Secondary | ICD-10-CM | POA: Insufficient documentation

## 2024-02-27 DIAGNOSIS — Y92129 Unspecified place in nursing home as the place of occurrence of the external cause: Secondary | ICD-10-CM | POA: Insufficient documentation

## 2024-02-27 DIAGNOSIS — I10 Essential (primary) hypertension: Secondary | ICD-10-CM | POA: Diagnosis not present

## 2024-02-27 DIAGNOSIS — K5900 Constipation, unspecified: Secondary | ICD-10-CM | POA: Diagnosis not present

## 2024-02-27 DIAGNOSIS — W19XXXA Unspecified fall, initial encounter: Secondary | ICD-10-CM | POA: Insufficient documentation

## 2024-02-27 DIAGNOSIS — S299XXA Unspecified injury of thorax, initial encounter: Secondary | ICD-10-CM | POA: Diagnosis not present

## 2024-02-27 DIAGNOSIS — Z79899 Other long term (current) drug therapy: Secondary | ICD-10-CM | POA: Insufficient documentation

## 2024-02-27 DIAGNOSIS — S0990XA Unspecified injury of head, initial encounter: Secondary | ICD-10-CM | POA: Diagnosis present

## 2024-02-27 DIAGNOSIS — R531 Weakness: Secondary | ICD-10-CM | POA: Insufficient documentation

## 2024-02-27 DIAGNOSIS — F17200 Nicotine dependence, unspecified, uncomplicated: Secondary | ICD-10-CM | POA: Diagnosis not present

## 2024-02-27 LAB — COMPREHENSIVE METABOLIC PANEL WITH GFR
ALT: 24 U/L (ref 0–44)
AST: 54 U/L — ABNORMAL HIGH (ref 15–41)
Albumin: 4.3 g/dL (ref 3.5–5.0)
Alkaline Phosphatase: 66 U/L (ref 38–126)
Anion gap: 13 (ref 5–15)
BUN: 15 mg/dL (ref 8–23)
CO2: 22 mmol/L (ref 22–32)
Calcium: 9.9 mg/dL (ref 8.9–10.3)
Chloride: 102 mmol/L (ref 98–111)
Creatinine, Ser: 0.95 mg/dL (ref 0.61–1.24)
GFR, Estimated: >60 mL/min
Glucose, Bld: 118 mg/dL — ABNORMAL HIGH (ref 70–99)
Potassium: 4.4 mmol/L (ref 3.5–5.1)
Sodium: 137 mmol/L (ref 135–145)
Total Bilirubin: 0.7 mg/dL (ref 0.0–1.2)
Total Protein: 7.8 g/dL (ref 6.5–8.1)

## 2024-02-27 LAB — CBC WITH DIFFERENTIAL/PLATELET
Abs Immature Granulocytes: 0.04 K/uL (ref 0.00–0.07)
Basophils Absolute: 0 K/uL (ref 0.0–0.1)
Basophils Relative: 0 %
Eosinophils Absolute: 0 K/uL (ref 0.0–0.5)
Eosinophils Relative: 0 %
HCT: 48.2 % (ref 39.0–52.0)
Hemoglobin: 16.5 g/dL (ref 13.0–17.0)
Immature Granulocytes: 0 %
Lymphocytes Relative: 15 %
Lymphs Abs: 1.6 K/uL (ref 0.7–4.0)
MCH: 29.6 pg (ref 26.0–34.0)
MCHC: 34.2 g/dL (ref 30.0–36.0)
MCV: 86.4 fL (ref 80.0–100.0)
Monocytes Absolute: 0.9 K/uL (ref 0.1–1.0)
Monocytes Relative: 8 %
Neutro Abs: 8 K/uL — ABNORMAL HIGH (ref 1.7–7.7)
Neutrophils Relative %: 77 %
Platelets: 232 K/uL (ref 150–400)
RBC: 5.58 MIL/uL (ref 4.22–5.81)
RDW: 13.6 % (ref 11.5–15.5)
WBC: 10.5 K/uL (ref 4.0–10.5)
nRBC: 0 % (ref 0.0–0.2)

## 2024-02-27 MED ORDER — IOHEXOL 350 MG/ML SOLN
75.0000 mL | Freq: Once | INTRAVENOUS | Status: AC | PRN
  Administered 2024-02-27: 75 mL via INTRAVENOUS

## 2024-02-27 MED ORDER — FENTANYL CITRATE (PF) 50 MCG/ML IJ SOSY
50.0000 ug | PREFILLED_SYRINGE | Freq: Once | INTRAMUSCULAR | Status: AC
  Administered 2024-02-27: 50 ug via INTRAVENOUS
  Filled 2024-02-27: qty 1

## 2024-02-27 NOTE — ED Provider Notes (Signed)
 " [facility] Provider Note   CSN: [id number] Arrival date & time: 02/27/24  9770     Patient presents with: Nathan  SAYVON Stafford is a 73 y.o. male who is a 28 old male presenting from [facility] after an unwitnessed mechanical fall.  Patient states he was ambulating outside with a walker to smoke when he lost his balance and fell and hit his head on the concrete.  He denies any LOC, nausea, vomiting, blurry or double vision but does endorse pain in his neck.  He is not anticoagulated.  Was previously on aspirin  but this was disc 10 years after his hospitalization last week with discharge less than 72 hours ago following hemorrhagic stroke.  Additionally has hypertension hyperlipidemia on hydralazine , Toprol -XL, Norvasc  for hypertension.  Has persistent left-sided weakness though this has been improving, continue with PT and OT at his SNF.  Also has had constipation with left lower abdominal pain, unchanged from a recent admission.   HPI     Prior to Admission medications  Medication Sig Start Date End Date Taking? Authorizing Provider  acetaminophen  (TYLENOL ) 325 MG tablet Take 2 tablets (650 mg total) by mouth every 4 (four) hours as needed for mild pain (pain score 1-3) or fever (or temp > 37.5 C (99.5 F)). 02/24/24   Sebastian Toribio GAILS, MD  amLODipine  (NORVASC ) 10 MG tablet Take 1 tablet (10 mg total) by mouth daily. 02/25/24   Sebastian Toribio GAILS, MD  atorvastatin  (LIPITOR) 40 MG tablet Take 1 tablet (40 mg total) by mouth daily. 02/25/24   Sebastian Toribio GAILS, MD  Blood Pressure KIT 1 kit by Does not apply route daily. 02/15/24   Remi Pippin, NP  brimonidine  (ALPHAGAN ) 0.2 % ophthalmic solution Place 1 drop into both eyes 2 (two) times daily.     [provider]  cyclobenzaprine  (FLEXERIL ) 10 MG tablet Take 1 tablet (10 mg total) by mouth 2 (two) times daily as needed for muscle spasms. 01/24/24   Randol Simmonds, MD   dorzolamide -timolol  (COSOPT ) 22.3-6.8 MG/ML ophthalmic solution Place 1 drop into both eyes 2 (two) times daily.     [provider]  fenofibrate  160 MG tablet Take 1 tablet (160 mg total) by mouth daily. 02/25/24   Sebastian Toribio GAILS, MD  hydrALAZINE  (APRESOLINE ) 25 MG tablet Take 3 tablets (75 mg total) by mouth every 8 (eight) hours. 02/24/24   Sebastian Toribio GAILS, MD  latanoprost  (XALATAN ) 0.005 % ophthalmic solution Place 1 drop into both eyes at bedtime. 05/05/22   [provider]  metoprolol  succinate (TOPROL -XL) 100 MG 24 hr tablet Take 1 tablet by mouth once daily 10/09/23   Anner Alm ORN, MD  nicotine  (NICODERM CQ  - DOSED IN MG/24 HOURS) 21 mg/24hr patch Place 1 patch (21 mg total) onto the skin daily. 02/25/24   Sebastian Toribio GAILS, MD  pantoprazole  (PROTONIX ) 40 MG tablet Take 1 tablet (40 mg total) by mouth at bedtime. 02/24/24   Sebastian Toribio GAILS, MD  polyethylene glycol (MIRALAX  / GLYCOLAX ) 17 g packet Take 17 g by mouth 2 (two) times daily. 02/24/24   Sebastian Toribio GAILS, MD  senna-docusate (SENOKOT-S) 8.6-50 MG tablet Take 2 tablets by mouth 2 (two) times daily. 02/24/24   Sebastian Toribio GAILS, MD  sildenafil  (VIAGRA ) 50 MG tablet TAKE 1 TABLET BY MOUTH ONCE DAILY AS NEEDED FOR ERECTILE DYSFUNCTION 01/07/24   Purcell Emil Schanz, MD  [Paused] spironolactone  (ALDACTONE ) 25 MG tablet Take 1  tablet (25 mg total) by mouth daily. Patient not taking: Reported on 02/14/2024 Wait to take this until your doctor or other care provider tells you to start again. 08/21/23   Joshua Debby CROME, MD  thiamine (VITAMIN B-1) 50 MG tablet Take 1 tablet (50 mg total) by mouth daily. 08/24/23   Joshua Debby CROME, MD    Allergies: Ace inhibitors and Flexeril  [cyclobenzaprine ]    Review of Systems  Musculoskeletal:  Positive for neck pain.  Skin:  Positive for wound.  Neurological:  Positive for headaches.    Updated Vital Signs BP (!) 161/72   Pulse 72   Temp 98.8 F (37.1 C) (Oral)    Resp 18   Ht 6' 2 (1.88 m)   Wt 85 kg   SpO2 100%   BMI 24.06 kg/m   Physical Exam Vitals and nursing note reviewed.  Constitutional:      Appearance: He is not ill-appearing or toxic-appearing.  HENT:     Head: Normocephalic and atraumatic.     Jaw: There is normal jaw occlusion.      Nose: Nose normal.     Mouth/Throat:     Mouth: Mucous membranes are moist.     Dentition: Abnormal dentition.     Pharynx: Oropharynx is clear. Uvula midline. No oropharyngeal exudate or posterior oropharyngeal erythema.  Eyes:     General:        Right eye: No discharge.        Left eye: No discharge.     Conjunctiva/sclera: Conjunctivae normal.  Neck:     Trachea: Trachea and phonation normal.     Comments: Cervical collar Cardiovascular:     Rate and Rhythm: Normal rate and regular rhythm.     Pulses: Normal pulses.     Heart sounds: Normal heart sounds. No murmur heard. Pulmonary:     Effort: Pulmonary effort is normal. No tachypnea, bradypnea or respiratory distress.     Breath sounds: Normal breath sounds. No wheezing or rales.  Abdominal:     General: Bowel sounds are normal. There is no distension.     Tenderness: There is no abdominal tenderness.  Musculoskeletal:        General: No deformity.     Cervical back: Neck supple. Spinous process tenderness present. No muscular tenderness.     Right lower leg: No edema.     Left lower leg: No edema.  Skin:    General: Skin is warm and dry.     Capillary Refill: Capillary refill takes less than 2 seconds.  Neurological:     Mental Status: He is alert and oriented to person, place, and time. Mental status is at baseline.     GCS: GCS eye subscore is 4. GCS verbal subscore is 5. GCS motor subscore is 6.     Cranial Nerves: Cranial nerves 2-12 are intact.     Sensory: Sensation is intact.     Motor: Weakness present.     Comments: Baseline L sided weakness compared to the R, persistent since recent hemorrhagic stroke. Gait  deferred for patient's safety.   Psychiatric:        Mood and Affect: Mood normal.     (all labs ordered are listed, but only abnormal results are displayed) Labs Reviewed  CBC WITH DIFFERENTIAL/PLATELET - Abnormal; Notable for the following components:      Result Value   Neutro Abs 8.0 (*)    All other components within normal limits  COMPREHENSIVE METABOLIC PANEL  WITH GFR - Abnormal; Notable for the following components:   Glucose, Bld 118 (*)    AST 54 (*)    All other components within normal limits    EKG: None  Radiology: CT CHEST ABDOMEN PELVIS W CONTRAST Result Date: 02/27/2024 CLINICAL DATA:  Patient tripped and fell. Laceration to the back of his head. Pneumothorax on chest x-ray. EXAM: CT CHEST, ABDOMEN, AND PELVIS WITH CONTRAST TECHNIQUE: Multidetector CT imaging of the chest, abdomen and pelvis was performed following the standard protocol during bolus administration of intravenous contrast. RADIATION DOSE REDUCTION: This exam was performed according to the departmental dose-optimization program which includes automated exposure control, adjustment of the mA and/or kV according to patient size and/or use of iterative reconstruction technique. CONTRAST:  75mL OMNIPAQUE  IOHEXOL  350 MG/ML SOLN COMPARISON:  No comparison studies available. FINDINGS: CT CHEST FINDINGS Cardiovascular: The heart size is normal. No substantial pericardial effusion. Coronary artery calcification is evident. Moderate atherosclerotic calcification is noted in the wall of the thoracic aorta. Mediastinum/Nodes: No mediastinal lymphadenopathy. There is no hilar lymphadenopathy. The esophagus has normal imaging features. There is no axillary lymphadenopathy. Lungs/Pleura: Centrilobular and paraseptal emphysema evident. Biapical pleuroparenchymal scarring evident. 8 mm left upper lobe pulmonary nodule identified on 51/5. Compressive atelectasis noted in the dependent lung bases bilaterally. No left-sided  pneumothorax as question on chest x-ray earlier today. Musculoskeletal: No worrisome lytic or sclerotic osseous abnormality. CT ABDOMEN PELVIS FINDINGS Hepatobiliary: Scattered tiny hypodensities in the liver parenchyma are too small to characterize but are statistically most likely benign. No followup imaging is recommended. There is no evidence for gallstones, gallbladder wall thickening, or pericholecystic fluid. No intrahepatic or extrahepatic biliary dilation. Pancreas: No focal mass lesion. No dilatation of the main duct. No intraparenchymal cyst. No peripancreatic edema. Spleen: No splenomegaly. No suspicious focal mass lesion. Adrenals/Urinary Tract: No adrenal nodule or mass. Cortical scarring noted right kidney 12 mm subcapsular lesion in the posterior interpolar right kidney has subtle irregular peripheral enhancement (axial 67/3 and coronal 101/7). Left kidney unremarkable. No evidence for hydroureter. The urinary bladder appears normal for the degree of distention. Stomach/Bowel: Stomach is unremarkable. No gastric wall thickening. No evidence of outlet obstruction. Duodenum is normally positioned as is the ligament of Treitz. No small bowel wall thickening. No small bowel dilatation. The terminal ileum is normal. The appendix is not well visualized, but there is no edema or inflammation in the region of the cecal tip to suggest appendicitis. No gross colonic mass. No colonic wall thickening. Diverticular changes are noted in the left colon without evidence of diverticulitis. Vascular/Lymphatic: There is moderate atherosclerotic calcification of the abdominal aorta without aneurysm. There is no gastrohepatic or hepatoduodenal ligament lymphadenopathy. No retroperitoneal or mesenteric lymphadenopathy. No pelvic sidewall lymphadenopathy. Reproductive: The prostate gland and seminal vesicles are unremarkable. Other: No intraperitoneal free fluid. Musculoskeletal: No worrisome lytic or sclerotic osseous  abnormality. IMPRESSION: 1. No acute traumatic injury in the chest, abdomen, or pelvis. No left-sided pneumothorax as questioned on chest x-ray earlier today. 2. 8 mm left upper lobe pulmonary nodule. Non-contrast chest CT at 6-12 months is recommended. If the nodule is stable at time of repeat CT, then future CT at 18-24 months (from today's scan) is considered optional for low-risk patients, but is recommended for high-risk patients. This recommendation follows the consensus statement: Guidelines for Management of Incidental Pulmonary Nodules Detected on CT Images: From the Fleischner Society 2017; Radiology 2017; 284:228-243. 3. 12 mm subcapsular lesion in the posterior interpolar right kidney has subtle  irregular peripheral enhancement. Urology consult recommended. MRI of the abdomen with and without contrast recommended to further evaluate. 4. Left colonic diverticulosis without diverticulitis. 5. Aortic Atherosclerosis (ICD10-I70.0) and Emphysema (ICD10-J43.9). Electronically Signed   By: Camellia Candle M.D.   On: 02/27/2024 05:27   DG Chest Portable 1 View Result Date: 02/27/2024 EXAM: 1 VIEW(S) XRAY OF THE CHEST 02/27/2024 03:35:00 AM COMPARISON: None available. CLINICAL HISTORY: fall FINDINGS: LUNGS AND PLEURA: No focal pulmonary opacity. No pleural effusion. Question left-sided deep sulcus sign which can be seen with pneumothorax. No discrete pleural alignment is identified. HEART AND MEDIASTINUM: Atherosclerotic plaque. No acute abnormality of the cardiac and mediastinal silhouettes. BONES AND SOFT TISSUES: Multilevel degenerative changes of thoracic spine. No acute osseous abnormality. IMPRESSION: 1. Possible left-sided deep sulcus sign, which can be seen with pneumothorax; no pleural line identified. Consider CT for further evaluation. Electronically signed by: Norman Gatlin MD 02/27/2024 03:50 AM EST RP Workstation: [id number]   CT HEAD WO CONTRAST ( ) Result Date: 02/27/2024 EXAM: CT HEAD  AND CERVICAL SPINE 02/27/2024 03:04:28 AM TECHNIQUE: CT of the head and cervical spine was performed without the administration of intravenous contrast. Multiplanar reformatted images are provided for review. Automated exposure control, iterative reconstruction, and/or weight based adjustment of the mA/kV was utilized to reduce the radiation dose to as low as reasonably achievable. COMPARISON: CT Head February 14, 2024. CT cervical spine January 24, 2024. CLINICAL HISTORY: Stroke, follow up; Fall with head trauma today, hemorrhagic stroke 1 week ago. FINDINGS: CT HEAD BRAIN AND VENTRICLES: Decreased density and conspicuity of an evolving intraparenchymal hemorrhage in the right perirolandic frontoparietal region. No evidence of new or interval acute hemorrhage. Decreased trace overlying subarachnoid hemorrhage. No mass effect or midline shift. No abnormal extra-axial fluid collection. No evidence of acute infarct. No hydrocephalus. Moderate white matter hypodensities, compatible with chronic small vessel ischemic change. Small remote lacunar infarct in the right frontal white matter. ORBITS: No acute abnormality. SINUSES AND MASTOIDS: No acute abnormality. SOFT TISSUES AND SKULL: No acute skull fracture. No acute soft tissue abnormality. CT CERVICAL SPINE BONES AND ALIGNMENT: No acute fracture or traumatic malalignment. 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 Bony foraminal narrowing is greatest on the right at C4-5 and C5-6 and on the left at C3-4. SOFT TISSUES: No prevertebral soft tissue swelling. IMPRESSION: 1. Decreased density and conspicuity of an evolving intraparenchymal hemorrhage in the right perirolandic area. 2. Decreased trace overlying subarachnoid hemorrhage. 3. No evidence of new/interval acute intracranial abnormality. 4. No acute fracture or traumatic malalignment in the cervical spine.  Electronically signed by: Gilmore Molt 02/27/2024 03:15 AM EST RP Workstation: [id number]   CT Cervical Spine Wo Contrast Result Date: 02/27/2024 EXAM: CT HEAD AND CERVICAL SPINE 02/27/2024 03:04:28 AM TECHNIQUE: CT of the head and cervical spine was performed without the administration of intravenous contrast. Multiplanar reformatted images are provided for review. Automated exposure control, iterative reconstruction, and/or weight based adjustment of the mA/kV was utilized to reduce the radiation dose to as low as reasonably achievable. COMPARISON: CT Head February 14, 2024. CT cervical spine January 24, 2024. CLINICAL HISTORY: Stroke, follow up; Fall with head trauma today, hemorrhagic stroke 1 week ago. FINDINGS: CT HEAD BRAIN AND VENTRICLES: Decreased density and conspicuity of an evolving intraparenchymal hemorrhage in the right perirolandic frontoparietal region. No evidence of new or interval acute hemorrhage. Decreased trace overlying subarachnoid hemorrhage. No mass effect or midline shift. No abnormal extra-axial fluid  collection. No evidence of acute infarct. No hydrocephalus. Moderate white matter hypodensities, compatible with chronic small vessel ischemic change. Small remote lacunar infarct in the right frontal white matter. ORBITS: No acute abnormality. SINUSES AND MASTOIDS: No acute abnormality. SOFT TISSUES AND SKULL: No acute skull fracture. No acute soft tissue abnormality. CT CERVICAL SPINE BONES AND ALIGNMENT: No acute fracture or traumatic malalignment. 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 Bony foraminal narrowing is greatest on the right at C4-5 and C5-6 and on the left at C3-4. SOFT TISSUES: No prevertebral soft tissue swelling. IMPRESSION: 1. Decreased density and conspicuity of an evolving intraparenchymal hemorrhage in the right perirolandic area. 2. Decreased trace overlying  subarachnoid hemorrhage. 3. No evidence of new/interval acute intracranial abnormality. 4. No acute fracture or traumatic malalignment in the cervical spine. Electronically signed by: Gilmore Molt 02/27/2024 03:15 AM EST RP Workstation: [id number]     Procedures   Medications Ordered in the ED  fentaNYL  (SUBLIMAZE ) injection 50 mcg (50 mcg Intravenous Given 02/27/24 0350)  iohexol  (OMNIPAQUE ) 350 MG/ML injection 75 mL (75 mLs Intravenous Contrast Given 02/27/24 0449)    Clinical Course as of 02/27/24 0650  Fri Feb 27, 2024  0521 Caregiver at the bedside confirms that this patient is at his new mental status baseline following hemorrhagic stroke last week.  [RS]    Clinical Course User Index [RS] Kalijah Westfall, Pleasant SAUNDERS, PA-C                                 Medical Decision Making 73 year old male presents after mechanical fall at his facility.  Patient with baseline dementia, recent hemorrhagic stroke.  Hypertensive on intake, vital signs are reassuring, cardiopulmonary and abdominal signs are benign.  Neurologic exam appears to be patient's new baseline.  Amount and/or Complexity of Data Reviewed Labs: ordered.    Details: CBC unremarkable, CMP reassuring Radiology: ordered.    Details: CT head and C-spine with improving bleeds from previous studies without new acute intracranial abnormality.  CT of the abdomen pelvis obtained due to question of possible pneumothorax on chest x-ray, as well as patient's persistent left lower quadrant abdominal pain since recent admission.  CTs are reassuring though there was an incidental renal finding for which patient was instructed to follow-up with urology.  Risk Prescription drug management.   Overall clinical history is very reassuring.  Scalp hematoma and tiny laceration to the left parietal scalp secondary to mechanical fall that does not require repair.  Recommend adjustment to patient's bowel regimen at home as suspect to the etiology  of his left lower quadrant pain is a constipation noted on exam and CT scan.  Clinical concern for emergent underlying injury or condition that would warrant further ED workup or inpatient management is exceedingly low.  Othon's caregiver voiced understanding of her medical evaluation and treatment plan. Each of their questions answered to their expressed satisfaction.  She is amenable to plan for d/c back to his facility via [facility].   This chart was dictated using voice recognition software, Dragon. Despite the best efforts of this provider to proofread and correct errors, errors may still occur which can change documentation meaning.      Final diagnoses:  Fall, initial encounter    ED Discharge Orders     None          Bobette Pleasant SAUNDERS, [place] 02/27/24 [phone]  Bari Charmaine FALCON, MD 03/01/24 [phone]  "

## 2024-02-27 NOTE — ED Triage Notes (Signed)
 Patient is coming from [facility] for an unwitnessed fall. Patient states that he was going outside to smoke a cigarette when he tripped and landed on his side. Patient presenting with a laceration on the back of his head and a bump on his that he said has been there for years. Patient is not on blood thinners. Patient was seen here two weeks ago for stroke, patient exhibiting left sided weakness with occasional confusion. Unknown what patient's baseline is.  EMS VS 164/84 BP 80 HR 99% RA 155 CBG

## 2024-02-27 NOTE — Progress Notes (Signed)
 Nathan Stafford                                          MRN: [id number]   02/27/2024   The [facility] Quality Team Specialist reviewed this patient medical record for the purposes of chart review for care gap closure. The following were reviewed: chart review for care gap closure-controlling blood pressure.    [facility]BCI Quality Team

## 2024-02-27 NOTE — Discharge Instructions (Addendum)
 Nathan Stafford was seen in the ER today for his fall.  Fortunately he did not have any rebleeding in his brain and the bleed that was previously present is improving at this time.  He is high risk for falls given his recent head bleed, therefore should be monitored at all times.  Additionally he is having some abdominal pain likely from his constipation.  Please increase his bowel regimen where he can have 1 soft bowel movement per day.  He should follow-up in the outpatient setting with the urologist listed below for further evaluation of the abnormality seen in his kidney on his CT scan.  Return to the ER if he develops any severe symptoms.

## 2024-02-27 NOTE — ED Notes (Signed)
 [facility] called for transport.

## 2024-03-01 NOTE — Telephone Encounter (Unsigned)
 Copied from CRM #[id number]. Topic: General - Other >> Feb 23, 2024 10:52 AM Thersia BROCKS wrote: Reason for CRM: Patient son, called in regarding patient condition, stated he received a missed all from the office, so would like a callback    [phone] >> Mar 01, 2024 10:56 AM Emylou G wrote: Son call adv [facility] had a stroke and [facility].. More of a FYI call.  He would still like a call back [phone] ETTER Charley Aris  )

## 2024-03-01 NOTE — Telephone Encounter (Signed)
 Patient has been scheduled for an hospital follow up .

## 2024-03-15 ENCOUNTER — Inpatient Hospital Stay: Admitting: Internal Medicine

## 2024-05-03 ENCOUNTER — Inpatient Hospital Stay: Admitting: Neurology

## 2024-08-02 ENCOUNTER — Ambulatory Visit
